# Patient Record
Sex: Female | Born: 1950 | Race: Black or African American | Hispanic: No | Marital: Married | Smoking: Never smoker
Health system: Southern US, Community
[De-identification: ages and names within clinical notes are randomized; demographics above are authoritative.]

## PROBLEM LIST (undated history)

## (undated) DIAGNOSIS — M549 Dorsalgia, unspecified: Secondary | ICD-10-CM

## (undated) DIAGNOSIS — Z973 Presence of spectacles and contact lenses: Secondary | ICD-10-CM

## (undated) DIAGNOSIS — Z972 Presence of dental prosthetic device (complete) (partial): Secondary | ICD-10-CM

## (undated) DIAGNOSIS — C801 Malignant (primary) neoplasm, unspecified: Secondary | ICD-10-CM

## (undated) DIAGNOSIS — R918 Other nonspecific abnormal finding of lung field: Secondary | ICD-10-CM

## (undated) DIAGNOSIS — I10 Essential (primary) hypertension: Secondary | ICD-10-CM

## (undated) DIAGNOSIS — T7840XA Allergy, unspecified, initial encounter: Secondary | ICD-10-CM

## (undated) HISTORY — DX: Other nonspecific abnormal finding of lung field: R91.8

## (undated) HISTORY — DX: Allergy, unspecified, initial encounter: T78.40XA

## (undated) HISTORY — DX: Dorsalgia, unspecified: M54.9

## (undated) HISTORY — PX: COLONOSCOPY W/ BIOPSIES AND POLYPECTOMY: SHX1376

## (undated) HISTORY — PX: MULTIPLE TOOTH EXTRACTIONS: SHX2053

---

## 1998-01-29 ENCOUNTER — Other Ambulatory Visit: Admission: RE | Admit: 1998-01-29 | Discharge: 1998-01-29 | Payer: Self-pay | Admitting: Family Medicine

## 2001-08-28 ENCOUNTER — Encounter: Admission: RE | Admit: 2001-08-28 | Discharge: 2001-08-28 | Payer: Self-pay | Admitting: Family Medicine

## 2001-08-28 ENCOUNTER — Encounter: Payer: Self-pay | Admitting: Family Medicine

## 2003-07-24 ENCOUNTER — Encounter: Admission: RE | Admit: 2003-07-24 | Discharge: 2003-07-24 | Payer: Self-pay | Admitting: Family Medicine

## 2003-09-22 ENCOUNTER — Ambulatory Visit (HOSPITAL_COMMUNITY): Admission: RE | Admit: 2003-09-22 | Discharge: 2003-09-22 | Payer: Self-pay | Admitting: Gastroenterology

## 2005-06-20 ENCOUNTER — Encounter: Admission: RE | Admit: 2005-06-20 | Discharge: 2005-06-20 | Payer: Self-pay | Admitting: Family Medicine

## 2006-07-24 ENCOUNTER — Encounter: Admission: RE | Admit: 2006-07-24 | Discharge: 2006-07-24 | Payer: Self-pay | Admitting: Family Medicine

## 2006-08-07 ENCOUNTER — Encounter: Admission: RE | Admit: 2006-08-07 | Discharge: 2006-08-07 | Payer: Self-pay | Admitting: Family Medicine

## 2007-08-29 ENCOUNTER — Encounter: Admission: RE | Admit: 2007-08-29 | Discharge: 2007-08-29 | Payer: Self-pay | Admitting: Family Medicine

## 2009-08-10 ENCOUNTER — Encounter: Admission: RE | Admit: 2009-08-10 | Discharge: 2009-08-10 | Payer: Self-pay | Admitting: Family Medicine

## 2010-09-06 ENCOUNTER — Other Ambulatory Visit: Payer: Self-pay | Admitting: Family Medicine

## 2010-09-06 DIAGNOSIS — Z1231 Encounter for screening mammogram for malignant neoplasm of breast: Secondary | ICD-10-CM

## 2010-10-04 ENCOUNTER — Ambulatory Visit: Payer: Self-pay

## 2010-10-08 NOTE — Op Note (Signed)
NAME:  Shelby Green, Shelby Green                           ACCOUNT NO.:  [id number]   MEDICAL RECORD NO.:  [id number]                   PATIENT TYPE:  AMB   LOCATION:  ENDO                                 FACILITY:  [facility]   PHYSICIAN:  Anselmo Rod, M.D.               DATE OF BIRTH:  14-Jan-1951   DATE OF PROCEDURE:  09/22/2003  DATE OF DISCHARGE:                                 OPERATIVE REPORT   PROCEDURE PERFORMED:  Screening colonoscopy.   ENDOSCOPIST:  Charna Elizabeth, M.D.   INSTRUMENT USED:  Olympus video colonoscope.   INDICATIONS FOR PROCEDURE:  The patient is a 70 African-American female  undergoing screening colonoscopy to rule out colonic polyps, masses, etc.   PREPROCEDURE PREPARATION:  Informed consent was procured from the patient.  The patient was fasted for eight hours prior to the procedure and prepped  with a bottle of magnesium citrate and a gallon of GoLYTELY the night prior  to the procedure.   PREPROCEDURE PHYSICAL:  The patient had stable vital signs.  Neck supple.  Chest clear to auscultation.  S1 and S2 regular.  Abdomen soft with normal  bowel sounds.   DESCRIPTION OF PROCEDURE:  The patient was placed in left lateral decubitus  position and sedated with 50 mg of Demerol and 5 mg of Versed intravenously.  Once the patient was adequately sedated and maintained on low flow oxygen  and continuous cardiac monitoring, the Olympus video colonoscope was  advanced from the rectum to the cecum.  The appendicular orifice and  ileocecal valve were clearly visualized and photographed.  Except for small  internal hemorrhoids seen on retroflexion, no other abnormalities were  noted.  No masses, polyps, or diverticula were seen.  The patient tolerated  the procedure well without immediate complications.   IMPRESSION:  Normal colonoscopy up to the cecum except for small internal  hemorrhoids, no masses or polyps seen, no evidence of diverticulosis.   RECOMMENDATIONS:  1.  Continue high fiber diet with liberal fluid intake.  2. Repeat colorectal cancer screening is recommended in the next 10 years     unless the patient develops any abnormal symptoms in the interim.  3. Outpatient followup as need arises in the future.                                               Anselmo Rod, M.D.    JNM/MEDQ  D:  09/22/2003  T:  09/22/2003  Job:  [id number]   cc:   Marjory Lies, M.D.  [address]  [place]  [place] [address]  Fax: [phone]

## 2010-10-11 ENCOUNTER — Ambulatory Visit
Admission: RE | Admit: 2010-10-11 | Discharge: 2010-10-11 | Disposition: A | Discharge status: Home / Self Care | Payer: BC Managed Care – PPO | Source: Ambulatory Visit | Attending: Family Medicine | Admitting: Family Medicine

## 2010-10-11 DIAGNOSIS — Z1231 Encounter for screening mammogram for malignant neoplasm of breast: Secondary | ICD-10-CM

## 2012-01-18 ENCOUNTER — Other Ambulatory Visit: Payer: Self-pay | Admitting: Family Medicine

## 2012-01-18 DIAGNOSIS — Z1231 Encounter for screening mammogram for malignant neoplasm of breast: Secondary | ICD-10-CM

## 2012-01-30 ENCOUNTER — Ambulatory Visit: Payer: BC Managed Care – PPO

## 2012-02-06 ENCOUNTER — Ambulatory Visit
Admission: RE | Admit: 2012-02-06 | Discharge: 2012-02-06 | Disposition: A | Discharge status: Home / Self Care | Payer: BC Managed Care – PPO | Source: Ambulatory Visit | Attending: Family Medicine | Admitting: Family Medicine

## 2012-02-06 DIAGNOSIS — Z1231 Encounter for screening mammogram for malignant neoplasm of breast: Secondary | ICD-10-CM

## 2012-08-01 ENCOUNTER — Ambulatory Visit (INDEPENDENT_AMBULATORY_CARE_PROVIDER_SITE_OTHER): Payer: BC Managed Care – PPO | Admitting: Family Medicine

## 2012-08-01 VITALS — BP 160/81 | HR 89 | Temp 98.3°F | Resp 16 | Ht 64.0 in | Wt 162.0 lb

## 2012-08-01 DIAGNOSIS — R42 Dizziness and giddiness: Secondary | ICD-10-CM

## 2012-08-01 DIAGNOSIS — M545 Low back pain, unspecified: Secondary | ICD-10-CM

## 2012-08-01 DIAGNOSIS — H8110 Benign paroxysmal vertigo, unspecified ear: Secondary | ICD-10-CM

## 2012-08-01 DIAGNOSIS — S139XXA Sprain of joints and ligaments of unspecified parts of neck, initial encounter: Secondary | ICD-10-CM

## 2012-08-01 DIAGNOSIS — S161XXA Strain of muscle, fascia and tendon at neck level, initial encounter: Secondary | ICD-10-CM

## 2012-08-01 LAB — POCT CBC
Granulocyte percent: 44.9 %G (ref 37–80)
HCT, POC: 43.5 % (ref 37.7–47.9)
Hemoglobin: 13.5 g/dL (ref 12.2–16.2)
Lymph, poc: 2.5 (ref 0.6–3.4)
MCH, POC: 29.2 pg (ref 27–31.2)
MCHC: 31 g/dL — AB (ref 31.8–35.4)
MCV: 94 fL (ref 80–97)
MID (cbc): 0.4 (ref 0–0.9)
MPV: 8 fL (ref 0–99.8)
POC Granulocyte: 2.4 (ref 2–6.9)
POC LYMPH PERCENT: 46.8 %L (ref 10–50)
POC MID %: 8.3 %M (ref 0–12)
Platelet Count, POC: 294 10*3/uL (ref 142–424)
RBC: 4.63 M/uL (ref 4.04–5.48)
RDW, POC: 14.3 %
WBC: 5.3 10*3/uL (ref 4.6–10.2)

## 2012-08-01 LAB — BASIC METABOLIC PANEL
BUN: 12 mg/dL (ref 6–23)
CO2: 30 mEq/L (ref 19–32)
Calcium: 9.6 mg/dL (ref 8.4–10.5)
Chloride: 105 mEq/L (ref 96–112)
Creat: 0.77 mg/dL (ref 0.50–1.10)
Glucose, Bld: 85 mg/dL (ref 70–99)
Potassium: 4.3 mEq/L (ref 3.5–5.3)
Sodium: 142 mEq/L (ref 135–145)

## 2012-08-01 LAB — GLUCOSE, POCT (MANUAL RESULT ENTRY): POC Glucose: 73 mg/dl (ref 70–99)

## 2012-08-01 MED ORDER — CYCLOBENZAPRINE HCL 10 MG PO TABS: 10.0000 mg | ORAL_TABLET | Freq: Two times a day (BID) | ORAL | Status: DC | PRN

## 2012-08-01 MED ORDER — MECLIZINE HCL 25 MG PO TABS: 25.0000 mg | ORAL_TABLET | Freq: Three times a day (TID) | ORAL | Status: DC | PRN

## 2012-08-01 NOTE — Patient Instructions (Addendum)
Use the muscle relaxer if needed for your neck pain/ strain.   If your back is bothering you use ibuprofen as needed If the vertigo (spinning feeling- not lightheaded feeling) comes back you may try the meclinzine- however do not combine this with the muscle relaxer (flexeril) as it could make you feel too sleepy  If any of these issues are not better in the next few days please let me know- Sooner if worse.

## 2012-08-01 NOTE — Progress Notes (Signed)
[facility] [address] [place] [address] [phone]  Date:  08/01/2012   Name:  Shelby Green   DOB:  1950-11-19   MRN:  [id number]  PCP:  No primary provider on file.    Chief Complaint: Neck Pain, Back Pain and Dizziness   History of Present Illness:  Shelby Green is a 62 y.o. very pleasant female patient who presents with the following:  She notes dizziness for the last 5 days or so.  She had noted a sensation of vertigo for the past few days, but today feels lightheaded. She has never had this problem for such a long time in the past, but has noted vertigo on occasion.  The vertigo occurs with motion, such as rolling over in bed or changing position.  When she holds still again she may have vertigo for about 2 minutes.  Today she does not have the vertigo but does have a lightheaded feeling.   No headache  She got new glasses about one month ago- otherwise no change in her vision.  She will sometimes have some tinnitus but this is longstanding.  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    Her back has hurt her for several months.  It is usually not severe, and hurts in her lower back.  Usually she is sore in her right side and not her left.  Her back hurts more when it is cold.  No radiation of her pain, no numbness or weakness   She is not aware of any injury to her neck or back.   Menopausal.    Her PCP is Dr. Rosezetta Schlatter.  She was told last year that her pulse was slow.   No CP or SOB.   She goes every year for her PE.    She has no significant medical history that she is aware of.    There is no problem list on file for this patient.   History reviewed. No pertinent past medical history.  History reviewed. No pertinent past surgical history.  History  Substance Use Topics  . Smoking status: Never Smoker   . Smokeless tobacco: Not on file  .  Alcohol Use: No    Family History  Problem Relation Age of Onset  . Arthritis Mother   . Hypertension Mother     No Known Allergies  Medication list has been reviewed and updated.  No current outpatient prescriptions on file prior to visit.   No current facility-administered medications on file prior to visit.    Review of Systems:  As per HPI- otherwise negative.   Physical Examination: Filed Vitals:   08/01/12 1141  BP: 138/85  Pulse: 57  Temp: 98.3 F (36.8 C)  Resp: 16   Filed Vitals:   08/01/12 1141  Height: 5\' 4"  (1.626 m)  Weight: 162 lb (73.483 kg)   Body mass index is 27.79 kg/(m^2). Ideal Body Weight: Weight in (lb) to have BMI = 25: 145.3  GEN: WDWN, NAD, Non-toxic, A & O x 3 HEENT: Atraumatic, Normocephalic. Neck supple. No masses, No LAD.  Bilateral TM wnl, oropharynx normal.  PEERL,EOMI.   Ears and Nose: No external deformity. CV: RRR, No M/G/R. No JVD. No thrill. No extra heart sounds. PULM: CTA B, no wheezes, crackles, rhonchi. No retractions. No resp. distress. No accessory muscle use. ABD: S,  NT, ND, +BS. No rebound. No HSM. EXTR: No c/c/e NEURO Normal gait. Normal strength, sensation and DTR all extremities, negative romberg PSYCH: Normally interactive. Conversant. Not depressed or anxious appearing.  Calm demeanor.  She is slightly tender in the left trapezius and neck muscles.   Back: normal flexion and extension.  minimal tenderness over right sciatic notch.  Negative SLR bilaterally, normal strength and sensation of legs Negative dix- halpike  EKG: sinus bradycardia to about 51 BPM, no ST elevation or depression  Results for orders placed in visit on 08/01/12  POCT CBC      Result Value Range   WBC 5.3  4.6 - 10.2 K/uL   Lymph, poc 2.5  0.6 - 3.4   POC LYMPH PERCENT 46.8  10 - 50 %L   MID (cbc) 0.4  0 - 0.9   POC MID % 8.3  0 - 12 %M   POC Granulocyte 2.4  2 - 6.9   Granulocyte percent 44.9  37 - 80 %G   RBC 4.63  4.04 - 5.48  M/uL   Hemoglobin 13.5  12.2 - 16.2 g/dL   HCT, POC 40.9  81.1 - 47.9 %   MCV 94.0  80 - 97 fL   MCH, POC 29.2  27 - 31.2 pg   MCHC 31.0 (*) 31.8 - 35.4 g/dL   RDW, POC 91.4     Platelet Count, POC 294  142 - 424 K/uL   MPV 8.0  0 - 99.8 fL  GLUCOSE, POCT (MANUAL RESULT ENTRY)      Result Value Range   POC Glucose 73  70 - 99 mg/dl   Pulse to around 90 with walking  See orthostatic BP and pulse- no orthostasis Assessment and Plan:.\ Lightheaded - Plan: POCT CBC, POCT glucose (manual entry), Basic metabolic panel, TSH, EKG 12-Lead  Neck strain, initial encounter - Plan: cyclobenzaprine (FLEXERIL) 10 MG tablet  Lumbar back pain  Benign paroxysmal positional vertigo - Plan: meclizine (ANTIVERT) 25 MG tablet  Leverne is here with a few concerns today.  It sounds like she has been having vertigo due to BPPV or a viral infection. Her vertigo is now resolved, but she has felt lightheaded.  Encouraged her to increase her fluid intake.  She may use meclizine if her vertigo returns.  Discussed her mild bradycardia. At this time she does not desire cardiology evaluation, but will keep an eye on her symptoms. Await TSH as well.    Flexeril to use as needed for neck and back pain.  She also has used ibuprofen with good results    COPLAND,JESSICA, MD

## 2012-08-02 ENCOUNTER — Telehealth: Payer: Self-pay | Admitting: Family Medicine

## 2012-08-02 ENCOUNTER — Encounter: Payer: Self-pay | Admitting: Family Medicine

## 2012-08-02 LAB — TSH: TSH: 1.532 u[IU]/mL (ref 0.350–4.500)

## 2012-08-02 NOTE — Telephone Encounter (Signed)
Called to go over her labs.  No answer so left detailed message on her home phone.  Labs are normal, will send her a copy of her labs and EKG.  If symptoms persist please let us or her PCP know. Have her PCP compare her EKG with any old EKGs at their next visit.

## 2014-05-20 ENCOUNTER — Other Ambulatory Visit: Payer: Self-pay | Admitting: Family Medicine

## 2014-05-20 DIAGNOSIS — Z1231 Encounter for screening mammogram for malignant neoplasm of breast: Secondary | ICD-10-CM

## 2014-05-29 ENCOUNTER — Ambulatory Visit: Payer: BC Managed Care – PPO

## 2014-06-03 ENCOUNTER — Ambulatory Visit
Admission: RE | Admit: 2014-06-03 | Discharge: 2014-06-03 | Disposition: A | Discharge status: Home / Self Care | Payer: BLUE CROSS/BLUE SHIELD | Source: Ambulatory Visit | Attending: Family Medicine | Admitting: Family Medicine

## 2014-06-03 ENCOUNTER — Other Ambulatory Visit: Payer: Self-pay | Admitting: Family Medicine

## 2014-06-03 DIAGNOSIS — Z1231 Encounter for screening mammogram for malignant neoplasm of breast: Secondary | ICD-10-CM

## 2014-08-28 ENCOUNTER — Encounter (HOSPITAL_COMMUNITY): Payer: Self-pay | Admitting: Emergency Medicine

## 2014-08-28 ENCOUNTER — Emergency Department (HOSPITAL_COMMUNITY)
Admission: EM | Admit: 2014-08-28 | Discharge: 2014-08-28 | Disposition: A | Discharge status: Home / Self Care | Payer: BLUE CROSS/BLUE SHIELD | Attending: Emergency Medicine | Admitting: Emergency Medicine

## 2014-08-28 DIAGNOSIS — Y9389 Activity, other specified: Secondary | ICD-10-CM | POA: Diagnosis not present

## 2014-08-28 DIAGNOSIS — Z79899 Other long term (current) drug therapy: Secondary | ICD-10-CM | POA: Diagnosis not present

## 2014-08-28 DIAGNOSIS — Y92410 Unspecified street and highway as the place of occurrence of the external cause: Secondary | ICD-10-CM | POA: Diagnosis not present

## 2014-08-28 DIAGNOSIS — Y998 Other external cause status: Secondary | ICD-10-CM | POA: Diagnosis not present

## 2014-08-28 DIAGNOSIS — M25512 Pain in left shoulder: Secondary | ICD-10-CM

## 2014-08-28 DIAGNOSIS — I10 Essential (primary) hypertension: Secondary | ICD-10-CM | POA: Diagnosis not present

## 2014-08-28 DIAGNOSIS — S4992XA Unspecified injury of left shoulder and upper arm, initial encounter: Secondary | ICD-10-CM | POA: Insufficient documentation

## 2014-08-28 DIAGNOSIS — S161XXA Strain of muscle, fascia and tendon at neck level, initial encounter: Secondary | ICD-10-CM | POA: Diagnosis not present

## 2014-08-28 HISTORY — DX: Essential (primary) hypertension: I10

## 2014-08-28 MED ORDER — CYCLOBENZAPRINE HCL 10 MG PO TABS: 10.0000 mg | ORAL_TABLET | Freq: Two times a day (BID) | ORAL | Status: DC | PRN

## 2014-08-28 MED ORDER — ACETAMINOPHEN 325 MG PO TABS
650.0000 mg | ORAL_TABLET | Freq: Once | ORAL | Status: AC
  Administered 2014-08-28: 650 mg via ORAL
  Filled 2014-08-28: qty 2
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
## 2015-06-03 ENCOUNTER — Other Ambulatory Visit: Payer: Self-pay

## 2015-06-03 DIAGNOSIS — Z1231 Encounter for screening mammogram for malignant neoplasm of breast: Secondary | ICD-10-CM

## 2015-06-12 ENCOUNTER — Ambulatory Visit: Payer: BLUE CROSS/BLUE SHIELD

## 2015-06-19 ENCOUNTER — Ambulatory Visit
Admission: RE | Admit: 2015-06-19 | Discharge: 2015-06-19 | Disposition: A | Discharge status: Home / Self Care | Payer: BLUE CROSS/BLUE SHIELD | Source: Ambulatory Visit

## 2015-06-19 DIAGNOSIS — Z1231 Encounter for screening mammogram for malignant neoplasm of breast: Secondary | ICD-10-CM

## 2015-06-23 ENCOUNTER — Ambulatory Visit: Payer: BLUE CROSS/BLUE SHIELD

## 2015-08-06 ENCOUNTER — Ambulatory Visit (INDEPENDENT_AMBULATORY_CARE_PROVIDER_SITE_OTHER): Payer: 59

## 2015-08-06 ENCOUNTER — Ambulatory Visit (INDEPENDENT_AMBULATORY_CARE_PROVIDER_SITE_OTHER): Payer: 59 | Admitting: Urgent Care

## 2015-08-06 VITALS — BP 144/80 | HR 60 | Temp 98.6°F | Resp 16 | Ht 64.0 in | Wt 164.6 lb

## 2015-08-06 DIAGNOSIS — M25512 Pain in left shoulder: Secondary | ICD-10-CM

## 2015-08-06 DIAGNOSIS — R252 Cramp and spasm: Secondary | ICD-10-CM

## 2015-08-06 DIAGNOSIS — I10 Essential (primary) hypertension: Secondary | ICD-10-CM

## 2015-08-06 DIAGNOSIS — Z23 Encounter for immunization: Secondary | ICD-10-CM | POA: Diagnosis not present

## 2015-08-06 LAB — COMPLETE METABOLIC PANEL WITH GFR
ALT: 14 U/L (ref 6–29)
AST: 16 U/L (ref 10–35)
Albumin: 4.1 g/dL (ref 3.6–5.1)
Alkaline Phosphatase: 65 U/L (ref 33–130)
BUN: 12 mg/dL (ref 7–25)
CO2: 27 mmol/L (ref 20–31)
Calcium: 9.5 mg/dL (ref 8.6–10.4)
Chloride: 102 mmol/L (ref 98–110)
Creat: 0.83 mg/dL (ref 0.50–0.99)
GFR, Est African American: 87 mL/min (ref >=60)
GFR, Est Non African American: 75 mL/min (ref >=60)
Glucose, Bld: 82 mg/dL (ref 65–99)
Potassium: 4 mmol/L (ref 3.5–5.3)
Sodium: 143 mmol/L (ref 135–146)
Total Bilirubin: 0.8 mg/dL (ref 0.2–1.2)
Total Protein: 6.5 g/dL (ref 6.1–8.1)

## 2015-08-06 MED ORDER — AMLODIPINE BESYLATE 5 MG PO TABS
5.0000 mg | ORAL_TABLET | Freq: Every day | ORAL | Status: DC
Start: 2015-08-06 — End: 2016-08-11

## 2015-08-06 MED ORDER — CYCLOBENZAPRINE HCL 5 MG PO TABS: 5.0000 mg | ORAL_TABLET | Freq: Every day | ORAL | Status: DC

## 2015-08-06 MED ORDER — NAPROXEN SODIUM 550 MG PO TABS
550.0000 mg | ORAL_TABLET | Freq: Two times a day (BID) | ORAL | Status: DC
Start: 2015-08-06 — End: 2018-02-05

## 2015-08-06 NOTE — Progress Notes (Signed)
MRN: [id number] DOB: 1950-08-29  Subjective:   Shelby Green is a 65 y.o. female presenting for chief complaint of Shoulder Pain; tingling sensation in both legs; and Flu Vaccine  Flu shot - Patient would like to have a flu shot today. Denies previous reaction to it. The last flu shot she received was ~2 years ago.  Shoulder - Reports 1 day history of left shoulder tightness, soreness. Has not tried any medications for this. Patient works at a [employer], uses her arms a lot. Denies trauma, redness, swelling, bony deformity. Patient wants to make sure she doesn't have arthritis given her family history of difficulty with this diagnosis.  Legs - Reports history of intermittent leg cramps, most notably while taking hydrochlorothiazide. She has since stopped taking this medication. Tries to drink ~32 ounces of water daily but is not always consistent with this. Patient stands on her feet most of the day while at work, sometimes shoes have an impact on her leg cramps. Denies smoking cigarettes or drinking alcohol.   Shelby Green currently has no medications in their medication list. Also has No Known Allergies.  Shelby Green  has a past medical history of Hypertension and Allergy. Also  has no past surgical history on file.   Reports family history includes Arthritis in her mother; Hypertension in her mother and sister; Stroke in her mother.   Objective:   Vitals: BP 144/80 mmHg  Pulse 60  Temp(Src) 98.6 F (37 C) (Oral)  Resp 16  Ht '5\' 4"'$  (1.626 m)  Wt 164 lb 9.6 oz (74.662 kg)  BMI 28.24 kg/m2  SpO2 98%  Physical Exam  Constitutional: She is oriented to person, place, and time. She appears well-developed and well-nourished.  HENT:  Mouth/Throat: Oropharynx is clear and moist.  Eyes: No scleral icterus.  Neck: Normal range of motion. Neck supple.  Cardiovascular: Normal rate, regular rhythm and intact distal pulses.  Exam reveals no gallop and no friction rub.   No murmur  heard. Pulmonary/Chest: No respiratory distress. She has no wheezes. She has no rales.  Musculoskeletal:       Left shoulder: She exhibits spasm (left trapezius). She exhibits normal range of motion, no tenderness (Negative Hawkin, Neer, Empty Can tests), no bony tenderness, no swelling, no effusion, no crepitus, no deformity, no laceration, no pain and normal strength.       Right lower leg: She exhibits no tenderness, no bony tenderness, no swelling, no edema, no deformity and no laceration.       Left lower leg: She exhibits no tenderness, no bony tenderness, no swelling, no edema, no deformity and no laceration.  Neurological: She is alert and oriented to person, place, and time. She has normal reflexes. Coordination normal.  Skin: Skin is warm and dry.  Psychiatric: She has a normal mood and affect.   No results found.  Assessment and Plan :   1. Essential hypertension - Patient agreed to restart BP management. Start amlodipine '5mg'$ . Check BP at home weekly. Recommended dietary modifications. RTC in 3 months or sooner if BP remains uncontrolled. F/u with labs by phone.  2. Left shoulder pain - Likely due to overuse, secondary to her work, radiology reading pending. Will start conservative management. RTC if no improvement or worsening symptoms despite Flexeril and Anaprox.  3. Cramp of both lower extremities - Like due to lack of hydration and standing for long periods of time. Counseled on increasing water intake and using compression stockings. Patient will recheck with  me if her leg cramps persist. Consider U/S.  4. Need for prophylactic vaccination and inoculation against influenza - Flu Vaccine QUAD 36+ mos IM   Jaynee Eagles, PA-C [facility] [phone] 08/06/2015 3:41 PM

## 2015-08-06 NOTE — Patient Instructions (Addendum)
Hypertension Hypertension, commonly called high blood pressure, is when the force of blood pumping through your arteries is too strong. Your arteries are the blood vessels that carry blood from your heart throughout your body. A blood pressure reading consists of a higher number over a lower number, such as 110/72. The higher number (systolic) is the pressure inside your arteries when your heart pumps. The lower number (diastolic) is the pressure inside your arteries when your heart relaxes. Ideally you want your blood pressure below 120/80. Hypertension forces your heart to work harder to pump blood. Your arteries may become narrow or stiff. Having untreated or uncontrolled hypertension can cause heart attack, stroke, kidney disease, and other problems. RISK FACTORS Some risk factors for high blood pressure are controllable. Others are not.  Risk factors you cannot control include:   Race. You may be at higher risk if you are African American.  Age. Risk increases with age.  Gender. Men are at higher risk than women before age 45 years. After age 65, women are at higher risk than men. Risk factors you can control include:  Not getting enough exercise or physical activity.  Being overweight.  Getting too much fat, sugar, calories, or salt in your diet.  Drinking too much alcohol. SIGNS AND SYMPTOMS Hypertension does not usually cause signs or symptoms. Extremely high blood pressure (hypertensive crisis) may cause headache, anxiety, shortness of breath, and nosebleed. DIAGNOSIS To check if you have hypertension, your health care provider will measure your blood pressure while you are seated, with your arm held at the level of your heart. It should be measured at least twice using the same arm. Certain conditions can cause a difference in blood pressure between your right and left arms. A blood pressure reading that is higher than normal on one occasion does not mean that you need treatment. If  it is not clear whether you have high blood pressure, you may be asked to return on a different day to have your blood pressure checked again. Or, you may be asked to monitor your blood pressure at home for 1 or more weeks. TREATMENT Treating high blood pressure includes making lifestyle changes and possibly taking medicine. Living a healthy lifestyle can help lower high blood pressure. You may need to change some of your habits. Lifestyle changes may include:  Following the DASH diet. This diet is high in fruits, vegetables, and whole grains. It is low in salt, red meat, and added sugars.  Keep your sodium intake below 2,300 mg per day.  Getting at least 30-45 minutes of aerobic exercise at least 4 times per week.  Losing weight if necessary.  Not smoking.  Limiting alcoholic beverages.  Learning ways to reduce stress. Your health care provider may prescribe medicine if lifestyle changes are not enough to get your blood pressure under control, and if one of the following is true:  You are 18-59 years of age and your systolic blood pressure is above 140.  You are 60 years of age or older, and your systolic blood pressure is above 150.  Your diastolic blood pressure is above 90.  You have diabetes, and your systolic blood pressure is over 140 or your diastolic blood pressure is over 90.  You have kidney disease and your blood pressure is above 140/90.  You have heart disease and your blood pressure is above 140/90. Your personal target blood pressure may vary depending on your medical conditions, your age, and other factors. HOME CARE INSTRUCTIONS    Have your blood pressure rechecked as directed by your health care provider.   Take medicines only as directed by your health care provider. Follow the directions carefully. Blood pressure medicines must be taken as prescribed. The medicine does not work as well when you skip doses. Skipping doses also puts you at risk for  problems.  Do not smoke.   Monitor your blood pressure at home as directed by your health care provider. SEEK MEDICAL CARE IF:   You think you are having a reaction to medicines taken.  You have recurrent headaches or feel dizzy.  You have swelling in your ankles.  You have trouble with your vision. SEEK IMMEDIATE MEDICAL CARE IF:  You develop a severe headache or confusion.  You have unusual weakness, numbness, or feel faint.  You have severe chest or abdominal pain.  You vomit repeatedly.  You have trouble breathing. MAKE SURE YOU:   Understand these instructions.  Will watch your condition.  Will get help right away if you are not doing well or get worse.   This information is not intended to replace advice given to you by your health care provider. Make sure you discuss any questions you have with your health care provider.   Document Released: 05/09/2005 Document Revised: 09/23/2014 Document Reviewed: 03/01/2013 Elsevier Interactive Patient Education 2016 Elsevier Inc.   Amlodipine tablets What is this medicine? AMLODIPINE (am LOE di peen) is a calcium-channel blocker. It affects the amount of calcium found in your heart and muscle cells. This relaxes your blood vessels, which can reduce the amount of work the heart has to do. This medicine is used to lower high blood pressure. It is also used to prevent chest pain. This medicine may be used for other purposes; ask your health care provider or pharmacist if you have questions. What should I tell my health care provider before I take this medicine? They need to know if you have any of these conditions: -heart problems like heart failure or aortic stenosis -liver disease -an unusual or allergic reaction to amlodipine, other medicines, foods, dyes, or preservatives -pregnant or trying to get pregnant -breast-feeding How should I use this medicine? Take this medicine by mouth with a glass of water. Follow the  directions on the prescription label. Take your medicine at regular intervals. Do not take more medicine than directed. Talk to your pediatrician regarding the use of this medicine in children. Special care may be needed. This medicine has been used in children as young as 6. Persons over 7 years old may have a stronger reaction to this medicine and need smaller doses. Overdosage: If you think you have taken too much of this medicine contact a poison control center or emergency room at once. NOTE: This medicine is only for you. Do not share this medicine with others. What if I miss a dose? If you miss a dose, take it as soon as you can. If it is almost time for your next dose, take only that dose. Do not take double or extra doses. What may interact with this medicine? -herbal or dietary supplements -local or general anesthetics -medicines for high blood pressure -medicines for prostate problems -rifampin This list may not describe all possible interactions. Give your health care provider a list of all the medicines, herbs, non-prescription drugs, or dietary supplements you use. Also tell them if you smoke, drink alcohol, or use illegal drugs. Some items may interact with your medicine. What should I watch for while using  this medicine? Visit your doctor or health care professional for regular check ups. Check your blood pressure and pulse rate regularly. Ask your health care professional what your blood pressure and pulse rate should be, and when you should contact him or her. This medicine may make you feel confused, dizzy or lightheaded. Do not drive, use machinery, or do anything that needs mental alertness until you know how this medicine affects you. To reduce the risk of dizzy or fainting spells, do not sit or stand up quickly, especially if you are an older patient. Avoid alcoholic drinks; they can make you more dizzy. Do not suddenly stop taking amlodipine. Ask your doctor or health care  professional how you can gradually reduce the dose. What side effects may I notice from receiving this medicine? Side effects that you should report to your doctor or health care professional as soon as possible: -allergic reactions like skin rash, itching or hives, swelling of the face, lips, or tongue -breathing problems -changes in vision or hearing -chest pain -fast, irregular heartbeat -swelling of legs or ankles Side effects that usually do not require medical attention (report to your doctor or health care professional if they continue or are bothersome): -dry mouth -facial flushing -nausea, vomiting -stomach gas, pain -tired, weak -trouble sleeping This list may not describe all possible side effects. Call your doctor for medical advice about side effects. You may report side effects to FDA at 1-800-FDA-1088. Where should I keep my medicine? Keep out of the reach of children. Store at room temperature between 59 and 86 degrees F (15 and 30 degrees C). Protect from light. Keep container tightly closed. Throw away any unused medicine after the expiration date. NOTE: This sheet is a summary. It may not cover all possible information. If you have questions about this medicine, talk to your doctor, pharmacist, or health care provider.    2016, Elsevier/Gold Standard. (2012-04-06 11:40:58)    How to Use Compression Stockings Compression stockings are elastic socks that squeeze the legs. They help to increase blood flow to the legs, decrease swelling in the legs, and reduce the chance of developing blood clots in the lower legs. Compression stockings are often used by people who:  Are recovering from surgery.  Have poor circulation in their legs.  Are prone to getting blood clots in their legs.  Have varicose veins.  Sit or stay in bed for long periods of time. HOW TO USE COMPRESSION STOCKINGS Before you put on your compression stockings:  Make sure that they are the  correct size. If you do not know your size, ask your health care provider.  Make sure that they are clean, dry, and in good condition.  Check them for rips and tears. Do not put them on if they are ripped or torn. Put your stockings on first thing in the morning, before you get out of bed. Keep them on for as long as your health care provider advises. When you are wearing your stockings:  Keep them as smooth as possible. Do not allow them to bunch up. It is especially important to prevent the stockings from bunching up around your toes or behind your knees.  Do not roll the stockings downward and leave them rolled down. This can decrease blood flow to your leg.  Change them right away if they become wet or dirty. When you take off your stockings, inspect your legs and feet. Anything that does not seem normal may require medical attention. Look for:  Open sores.  Red spots.  Swelling. INFORMATION AND TIPS  Do not stop wearing your compression stockings without talking to your health care provider first.  Wash your stockings everyday with mild detergent in cold or warm water. Do not use bleach. Air-dry your stockings or dry them in a clothes dryer on low heat.  Replace your stockings every 3-6 months.  If skin moisturizing is part of your treatment plan, apply lotion or cream at night so that your skin will be dry when you put on the stockings in the morning. It is harder to put the stockings on when you have lotion on your legs or feet. SEEK MEDICAL CARE IF: Remove your stockings and seek medical care if:  You have a feeling of pins and needles in your feet or legs.  You have any new changes in your skin.  You have skin lesions that are getting worse.  You have swelling or pain that is getting worse. SEEK IMMEDIATE MEDICAL CARE IF:  You have numbness or tingling in your lower legs that does not get better immediately after you take the stockings off.  Your toes or feet become  cold and blue.  You develop open sores or red spots on your legs that do not go away.  You see or feel a warm spot on your leg.  You have new swelling or soreness in your leg.  You are short of breath or you have chest pain for no reason.  You have a rapid or irregular heartbeat.  You feel light-headed or dizzy.   This information is not intended to replace advice given to you by your health care provider. Make sure you discuss any questions you have with your health care provider.   Document Released: 03/06/2009 Document Revised: 09/23/2014 Document Reviewed: 04/16/2014 Elsevier Interactive Patient Education 2016 [employer].     IF you received an x-ray today, you will receive an invoice from [facility]. Please contact [facility] at [phone] with questions or concerns regarding your invoice.   IF you received labwork today, you will receive an invoice from [employer]. Please contact [facility] at [phone] with questions or concerns regarding your invoice.   Our billing staff will not be able to assist you with questions regarding bills from these companies.  You will be contacted with the lab results as soon as they are available. The fastest way to get your results is to activate your My Chart account. Instructions are located on the last page of this paperwork. If you have not heard from [place] regarding the results in 2 weeks, please contact this office.

## 2016-03-28 DIAGNOSIS — I10 Essential (primary) hypertension: Secondary | ICD-10-CM | POA: Insufficient documentation

## 2016-08-11 ENCOUNTER — Other Ambulatory Visit: Payer: Self-pay | Admitting: Urgent Care

## 2016-08-11 DIAGNOSIS — I10 Essential (primary) hypertension: Secondary | ICD-10-CM

## 2016-08-15 ENCOUNTER — Other Ambulatory Visit: Payer: Self-pay | Admitting: Physician Assistant

## 2016-08-15 DIAGNOSIS — Z1231 Encounter for screening mammogram for malignant neoplasm of breast: Secondary | ICD-10-CM

## 2016-09-05 ENCOUNTER — Ambulatory Visit
Admission: RE | Admit: 2016-09-05 | Discharge: 2016-09-05 | Disposition: A | Discharge status: Home / Self Care | Payer: 59 | Source: Ambulatory Visit | Attending: Physician Assistant | Admitting: Physician Assistant

## 2016-09-05 DIAGNOSIS — Z1231 Encounter for screening mammogram for malignant neoplasm of breast: Secondary | ICD-10-CM

## 2016-10-09 ENCOUNTER — Other Ambulatory Visit: Payer: Self-pay | Admitting: Urgent Care

## 2016-10-09 DIAGNOSIS — I10 Essential (primary) hypertension: Secondary | ICD-10-CM

## 2016-10-15 ENCOUNTER — Other Ambulatory Visit: Payer: Self-pay | Admitting: Urgent Care

## 2016-10-15 DIAGNOSIS — I10 Essential (primary) hypertension: Secondary | ICD-10-CM

## 2016-11-20 ENCOUNTER — Other Ambulatory Visit: Payer: Self-pay | Admitting: Urgent Care

## 2016-11-20 DIAGNOSIS — I10 Essential (primary) hypertension: Secondary | ICD-10-CM

## 2016-11-22 ENCOUNTER — Telehealth: Payer: Self-pay | Admitting: Family Medicine

## 2016-11-22 NOTE — Telephone Encounter (Signed)
lmom to call and set up an appointment with [facility] for an [place] med f/u

## 2016-11-22 NOTE — Telephone Encounter (Signed)
lmom to call and set up an appointment with [facility] for an [place] med f/u

## 2017-08-15 ENCOUNTER — Other Ambulatory Visit: Payer: Self-pay | Admitting: Physician Assistant

## 2017-08-15 DIAGNOSIS — Z1231 Encounter for screening mammogram for malignant neoplasm of breast: Secondary | ICD-10-CM

## 2017-09-07 ENCOUNTER — Ambulatory Visit: Payer: 59

## 2017-09-08 ENCOUNTER — Ambulatory Visit: Payer: 59

## 2017-09-11 ENCOUNTER — Ambulatory Visit
Admission: RE | Admit: 2017-09-11 | Discharge: 2017-09-11 | Disposition: A | Discharge status: Home / Self Care | Payer: Medicare PPO | Source: Ambulatory Visit | Attending: Physician Assistant | Admitting: Physician Assistant

## 2017-09-11 DIAGNOSIS — Z1231 Encounter for screening mammogram for malignant neoplasm of breast: Secondary | ICD-10-CM

## 2018-01-21 DIAGNOSIS — C3490 Malignant neoplasm of unspecified part of unspecified bronchus or lung: Secondary | ICD-10-CM | POA: Insufficient documentation

## 2018-01-21 HISTORY — DX: Malignant neoplasm of unspecified part of unspecified bronchus or lung: C34.90

## 2018-02-01 ENCOUNTER — Encounter: Payer: Medicare PPO | Admitting: Cardiothoracic Surgery

## 2018-02-05 ENCOUNTER — Institutional Professional Consult (permissible substitution): Payer: Medicare PPO | Admitting: Cardiothoracic Surgery

## 2018-02-05 VITALS — BP 145/78 | HR 76 | Resp 20 | Ht 64.0 in | Wt 155.0 lb

## 2018-02-05 DIAGNOSIS — R911 Solitary pulmonary nodule: Secondary | ICD-10-CM

## 2018-02-05 NOTE — Progress Notes (Signed)
[address]       [place] [address]             [phone]                    Aprile W Yellin Weir Medical Record #[id number] Date of Birth: [birth year]  Referring: Gwenevere Ghazi, MD Primary Care: Stephens Shire, MD Primary Cardiologist: No primary care provider on file.  Chief Complaint:    Chief Complaint  Patient presents with  . Lung Lesion    History of Present Illness:    Shelby Green 67 y.o. female is seen in the office  today for evaluation of a right lower lobe lung nodule.  The patient is states she has been a lifelong non-smoker.  She presented in June with upper respiratory complaints sinus drainage and cough.  Because of these complaints she was seen in urgent care and a chest x-ray was obtained, there was some opacity in the right lower lobe.  Subsequently the patient was referred to Dr. Glade Lloyd in [facility], CT scan of the chest and PET scan were performed.  Bronchoscopy was done that was unrevealing of any tissue diagnosis by Dr. Glade Lloyd.  This the plan was to obtain a follow-up CT scan in several months which was done last week.  The right lower lobe mass by report appears the same.  The patient will return and bring copies of the discs of her CT scans.  I have reviewed the PET scan images, and have requested pathology to amend the report as the lesion is in the right lower lobe not the right upper lobe.   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  Her husband has been a long-term smoker.  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  Currently she remains active walking up to 4 miles a day.      Current Activity/ Functional Status:  Patient is independent with mobility/ambulation, transfers, ADL's, IADL's.   Zubrod Score: At the time of surgery this patient's most appropriate activity status/level should be described as: [x]     0    Normal  activity, no symptoms []     1    Restricted in physical strenuous activity but ambulatory, able to do out light work []     2    Ambulatory and capable of self care, unable to do work activities, up and about               >50 % of waking hours                              []     3    Only limited self care, in bed greater than 50% of waking hours []     4    Completely disabled, no self care, confined to bed or chair []     5    Moribund   Past Medical History:  Diagnosis Date  . Allergy   . Back pain   . Hypertension   . Hypertension   . Lung mass     No past surgical history on file.  Family History  Problem Relation Age of Onset  . Arthritis Mother   . Hypertension Mother   . Stroke Mother   . Hypertension Sister  Patient's family history is for father who died in his 34s in a tractor accident.  Since somebody ran into the back of patient's mother died at age 68 with issues related to stroke she had known coronary occlusive disease, one brother died at age 21 of myocardial infarction she has 1 brother insists 5 sisters who are all lung.  One aunt, -sister of her mother died at age 65 of lung cancer she was a lifelong non-smoker   Social History   Tobacco Use  Smoking Status Never Smoker  Smokeless Tobacco Never Used    Social History   Substance and Sexual Activity  Alcohol Use No     Allergies  Allergen Reactions  . Penicillin G Itching    Current Outpatient Medications  Medication Sig Dispense Refill  . Travoprost, BAK Free, (TRAVATAN) 0.004 % SOLN ophthalmic solution Place 1 drop into both eyes at bedtime.     No current facility-administered medications for this visit.     Pertinent items are noted in HPI.   Review of Systems:     Cardiac Review of Systems: [Y] = yes  or   [ N ] = no   Chest Pain [  n  ]  Resting SOB [ n ] Exertional SOB  Blue.Reese  ]  Orthopnea Florencio.Farrier  ]   Pedal Edema [ n  ]    Palpitations [ n ] Syncope  [ n ]   Presyncope [ n  ]   General  Review of Systems: [Y] = yes [  ]=no Constitional: recent weight change [  ];  Wt loss over the last 3 months [   ] anorexia [  ]; fatigue [  ]; nausea [  ]; night sweats [  ]; fever [  ]; or chills [  ];           Eye : blurred vision [  ]; diplopia [   ]; vision changes [  ];  Amaurosis fugax[  ]; Resp: cough [  ];  wheezing[  ];  hemoptysis[  ]; shortness of breath[  ]; paroxysmal nocturnal dyspnea[  ]; dyspnea on exertion[  ]; or orthopnea[  ];  GI:  gallstones[  ], vomiting[  ];  dysphagia[  ]; melena[  ];  hematochezia [  ]; heartburn[  ];   Hx of  Colonoscopy[ y ]; GU: kidney stones [  ]; hematuria[  ];   dysuria [  ];  nocturia[  ];  history of     obstruction [  ]; urinary frequency [  ]             Skin: rash, swelling[  ];, hair loss[  ];  peripheral edema[  ];  or itching[  ]; Musculosketetal: myalgias[  ];  joint swelling[  ];  joint erythema[  ];  joint pain[  ];  back pain[  ];  Heme/Lymph: bruising[  ];  bleeding[  ];  anemia[  ];  Neuro: TIA[  ];  headaches[  ];  stroke[  ];  vertigo[  ];  seizures[  ];   paresthesias[  ];  difficulty walking[  ];  Psych:depression[  ]; anxiety[  ];  Endocrine: diabetes[  ];  thyroid dysfunction[  ];  Immunizations: Flu up to date [  ]; Pneumococcal up to date [  ];  Other:    PHYSICAL EXAMINATION: BP (!) 145/78   Pulse 76   Resp 20   Ht 5\' 4"  (1.626 m)  Wt 155 lb (70.3 kg)   SpO2 96% Comment: RA  BMI 26.61 kg/m  General appearance: alert, cooperative and no distress Head: Normocephalic, without obvious abnormality, atraumatic Neck: no adenopathy, no carotid bruit, no JVD, supple, symmetrical, trachea midline and thyroid not enlarged, symmetric, no tenderness/mass/nodules Lymph nodes: Cervical, supraclavicular, and axillary nodes normal. Resp: clear to auscultation bilaterally Back: symmetric, no curvature. ROM normal. No CVA tenderness. Cardio: regular rate and rhythm, S1, S2 normal, no murmur, click, rub or gallop GI: soft,  non-tender; bowel sounds normal; no masses,  no organomegaly Extremities: extremities normal, atraumatic, no cyanosis or edema Neurologic: Grossly normal  Diagnostic Studies & Laboratory data:     Recent Radiology Findings:   CLINICAL DATA: Initial treatment strategy for pulmonary nodule.  Final Report  ADDENDUM REPORT: 02/05/2018 15:37  ADDENDUM: Correction :  IMPRESSION:  Hypermetabolic RIGHT LOWER lobe nodule.  EXAM: NUCLEAR MEDICINE PET SKULL BASE TO THIGH  TECHNIQUE: Fifteen mCi F-18 FDG was injected intravenously. Full-ring PET imaging was performed from the skull base to thigh after the radiotracer. CT data was obtained and used for attenuation correction and anatomic localization.  Fasting blood glucose: 94 mg/dl  COMPARISON: None available  FINDINGS: Mediastinal blood pool activity: SUV max 1. Is 48  NECK: No hypermetabolic lymph nodes in the neck.  Incidental CT findings: none  CHEST: Within the RIGHT lower lobe 18 mm nodule above the RIGHT hemidiaphragm has associated metabolic activity with SUV max equal 4.8 no additional pulmonary nodules. No hypermetabolic mediastinal lymph nodes.  Incidental CT findings: None  ABDOMEN/PELVIS: No abnormal hypermetabolic activity within the liver, pancreas, adrenal glands, or spleen. No hypermetabolic lymph nodes in the abdomen or pelvis. No adnexal abnormality.  Incidental CT findings: none  SKELETON: No focal hypermetabolic activity to suggest skeletal metastasis.  Incidental CT findings: none  IMPRESSION: 1. Hypermetabolic RIGHT upper lobe pulmonary nodule is concerning for bronchogenic carcinoma. If no smoking history, consider a focus of pulmonary infection. As lesion would be difficult for percutaneous biopsy, consider short-term CT follow-up (6 weeks). If more immediate tissue sampling is warranted, consider bronchoscopy. 2. No hypermetabolic mediastinal nodes.   Electronically Signed By: Suzy Bouchard M.D. On: 11/17/2017 15:56            I have independently reviewed the above radiology studies  and reviewed the findings with the patient.   Recent Lab Findings: Lab Results  Component Value Date   WBC 5.3 08/01/2012   HGB 13.5 08/01/2012   HCT 43.5 08/01/2012   GLUCOSE 82 08/06/2015   ALT 14 08/06/2015   AST 16 08/06/2015   NA 143 08/06/2015   K 4.0 08/06/2015   CL 102 08/06/2015   CREATININE 0.83 08/06/2015   BUN 12 08/06/2015   CO2 27 08/06/2015   TSH 1.532 08/01/2012      Assessment / Plan:   1.8 cm mildly hypermetabolic right lower lobe lung lesion very close to the diaphragm is-the area has been persistent compared to CT scans from June 2019 and August 2019. -She is been referred for consideration of surgical resection.  I discussed with her the pros and cons of attempting a biopsy versus a surgical resection and biopsy simultaneously.  We will obtain a full set of pulmonary function studies.  She will come on her next appointment with the images of the CT scan which are not available online to me to review.  I have reviewed the PET scan with her.  She will return later this week with full  pulmonary function studies and images to review and will make a final decision about resection.  I  spent 60 minutes with  the patient face to face and greater then 50% of the time was spent in counseling and coordination of care.    Grace Isaac MD      [address] [place], [address] Office [phone]   Beeper [phone]  02/06/2018 4:07 PM

## 2018-02-05 NOTE — Patient Instructions (Signed)
Pulmonary Nodule A pulmonary nodule is a small, round growth of tissue in the lung. Pulmonary nodules can range in size from less than 1/5 inch (4 mm) to a little bigger than an inch (25 mm). Most pulmonary nodules are detected when imaging tests of the lung are being performed for a different problem. Pulmonary nodules are usually not cancerous (benign). However, some pulmonary nodules are cancerous (malignant). Follow-up treatment or testing is based on the size of the pulmonary nodule and your risk of getting lung cancer. What are the causes? Benign pulmonary nodules can be caused by various things. Some of the causes include:  Bacterial, fungal, or viral infections. This is usually an old infection that is no longer active, but it can sometimes be a current, active infection.  A benign mass of tissue.  Inflammation from conditions such as rheumatoid arthritis.  Abnormal blood vessels in the lungs.  Malignant pulmonary nodules can result from lung cancer or from cancers that spread to the lung from other places in the body. What are the signs or symptoms? Pulmonary nodules usually do not cause symptoms. How is this diagnosed? Most often, pulmonary nodules are found incidentally when an X-ray or CT scan is performed to look for some other problem in the lung area. To help determine whether a pulmonary nodule is benign or malignant, your health care provider will take a medical history and order a variety of tests. Tests done may include:  Blood tests.  A skin test called a tuberculin test. This test is used to determine if you have been exposed to the germ that causes tuberculosis.  Chest X-rays. If possible, a new X-ray may be compared with X-rays you have had in the past.  CT scan. This test shows smaller pulmonary nodules more clearly than an X-ray.  Positron emission tomography (PET) scan. In this test, a safe amount of a radioactive substance is injected into the bloodstream. Then,  the scan takes a picture of the pulmonary nodule. The radioactive substance is eliminated from your body in your urine.  Biopsy. A tiny piece of the pulmonary nodule is removed so it can be checked under a microscope.  How is this treated? Pulmonary nodules that are benign normally do not require any treatment because they usually do not cause symptoms or breathing problems. Your health care provider may want to monitor the pulmonary nodule through follow-up CT scans. The frequency of these CT scans will vary based on the size of the nodule and the risk factors for lung cancer. For example, CT scans will need to be done more frequently if the pulmonary nodule is larger and if you have a history of smoking and a family history of cancer. Further testing or biopsies may be done if any follow-up CT scan shows that the size of the pulmonary nodule has increased. Follow these instructions at home:  Only take over-the-counter or prescription medicines as directed by your health care provider.  Keep all follow-up appointments with your health care provider. Contact a health care provider if:  You have trouble breathing when you are active.  You feel sick or unusually tired.  You do not feel like eating.  You lose weight without trying to.  You develop chills or night sweats. Get help right away if:  You cannot catch your breath, or you begin wheezing.  You cannot stop coughing.  You cough up blood.  You become dizzy or feel like you are going to pass out.  You  have sudden chest pain.  You have a fever or persistent symptoms for more than 2-3 days.  You have a fever and your symptoms suddenly get worse. This information is not intended to replace advice given to you by your health care provider. Make sure you discuss any questions you have with your health care provider. Document Released: 03/06/2009 Document Revised: 10/15/2015 Document Reviewed: 10/29/2012 Elsevier Interactive  Patient Education  2017 [employer] Lung cancer occurs when abnormal cells in the lung grow out of control and form a mass (tumor). There are several types of lung cancer. The two most common types are:  Non-small cell. In this type of lung cancer, abnormal cells are larger and grow more slowly than those of small cell lung cancer.  Small cell. In this type of lung cancer, abnormal cells are smaller than those of non-small cell lung cancer. Small cell lung cancer gets worse faster than non-small cell lung cancer.  What are the causes? The leading cause of lung cancer is smoking tobacco. The second leading cause is radon exposure. What increases the risk?  Smoking tobacco.  Exposure to secondhand tobacco smoke.  Exposure to radon gas.  Exposure to asbestos.  Exposure to arsenic in drinking water.  Air pollution.  Family or personal history of lung cancer.  Lung radiation therapy.  Being older than 73 years. What are the signs or symptoms? In the early stages, symptoms may not be present. As the cancer progresses, symptoms may include:  A lasting cough, possibly with blood.  Fatigue.  Unexplained weight loss.  Shortness of breath.  Wheezing.  Chest pain.  Loss of appetite.  Symptoms of advanced lung cancer include:  Hoarseness.  Bone or joint pain.  Weakness.  Nail problems.  Face or arm swelling.  Paralysis of the face.  Drooping eyelids.  How is this diagnosed? Lung cancer can be identified with a physical exam and with tests such as:  A chest X-ray.  A CT scan.  Blood tests.  A biopsy.  After a diagnosis is made, you will have more tests to determine the stage of the cancer. The stages of non-small cell lung cancer are:  Stage 0, also called carcinoma in situ. At this stage, abnormal cells are found in the inner lining of your lung or lungs.  Stage I. At this stage, abnormal cells have grown into a tumor that is no  larger than 5 cm across. The cancer has entered the deeper lung tissue but has not yet entered the lymph nodes or other parts of the body.  Stage II. At this stage, the tumor is 7 cm across or smaller and has entered nearby lymph nodes. Or, the tumor is 5 cm across or smaller and has invaded surrounding tissue but is not found in nearby lymph nodes. There may be more than one tumor present.  Stage III. At this stage, the tumor may be any size. There may be more than one tumor in the lungs. The cancer cells have spread to the lymph nodes and possibly to other organs.  Stage IV. At this stage, there are tumors in both lungs and the cancer has spread to other areas of the body.  The stages of small cell lung cancer are:  Limited. At this stage, the cancer is found only on one side of the chest.  Extensive. At this stage, the cancer is in the lungs and in tissues on the other side of the chest.  The cancer has spread to other organs or is found in the fluid between the layers of your lungs.  How is this treated? Depending on the type and stage of your lung cancer, you may be treated with:  Surgery. This is done to remove a tumor.  Radiation therapy. This treatment destroys cancer cells using X-rays or other types of radiation.  Chemotherapy. This treatment uses medicines to destroy cancer cells.  Targeted therapy. This treatment aims to destroy only cancer cells instead of all cells as other therapies do.  You may also have a combination of treatments. Follow these instructions at home:  Do not use any tobacco products. This includes cigarettes, chewing tobacco, and electronic cigarettes. If you need help quitting, ask your health care provider.  Take medicines only as directed by your health care provider.  Eat a healthy diet. Work with a dietitian to make sure you are getting the nutrition you need.  Consider joining a support group or seeking counseling to help you cope with the  stress of having lung cancer.  Let your cancer specialist (oncologist) know if you are admitted to the hospital.  Keep all follow-up visits as directed by your health care provider. This is important. Contact a health care provider if:  You lose weight without trying.  You have a persistent cough and wheezing.  You feel short of breath.  You tire easily.  You experience bone or joint pain.  You have difficulty swallowing.  You feel hoarse or notice your voice changing.  Your pain medicine is not helping. Get help right away if:  You cough up blood.  You have new breathing problems.  You develop chest pain.  You develop swelling in: ? One or both ankles or legs. ? Your face, neck, or arms.  You are confused.  You experience paralysis in your face or a drooping eyelid. This information is not intended to replace advice given to you by your health care provider. Make sure you discuss any questions you have with your health care provider. Document Released: 08/15/2000 Document Revised: 10/15/2015 Document Reviewed: 09/12/2013 Elsevier Interactive Patient Education  2018 [employer].   Lung Resection A lung resection is a procedure to remove part or all of a lung. When an entire lung is removed, the procedure is called a pneumonectomy. When only part of a lung is removed, the procedure is called a lobectomy. A lung resection is typically done to get rid of a tumor or cancer, but it may be done to treat other conditions. This procedure can help relieve some or all of your symptoms and can also help keep the problem from getting worse. Lung resection may provide the best chance for curing your disease. However, the procedure may not necessarily cure lung cancer if that is the problem. Tell a health care provider about:  Any allergies you have.  All medicines you are taking, including vitamins, herbs, eye drops, creams, and over-the-counter medicines.  Any problems you or  family members have had with anesthetic medicines.  Any blood disorders you have.  Any surgeries you have had.  Any medical conditions you have. What are the risks? Generally, lung resection is a safe procedure. However, problems can occur and include:  Excessive bleeding.  Infection.  Inability to breathe without a ventilator.  Persistent shortness of breath.  Heart problems, including abnormal rhythms and a risk of heart attack or heart failure.  Blood clots.  Injury to a blood vessel.  Injury to  a nerve.  Failure to heal properly.  Stroke.  Bronchopleural fistula. This is a small hole between one of the main breathing tubes (bronchus) and the lining of the lungs. This is rare.  Reaction to anesthesia.  What happens before the procedure? You may have tests done before the procedure, including:  Blood tests.  Urine tests.  X-rays.  Other imaging tests (such as CT scans, MRI scans, and PET scans). These tests are done to find the exact size and location of the condition being treated with this surgery.  Pulmonary function tests. These are breathing tests to assess the function of your lungs before surgery and to decide how to best help your breathing after surgery.  Heart testing. This is done to make sure your heart is strong enough for the procedure.  Bronchoscopy. This is a technique that allows your health care provider to look at the inside of your airways. This is done using a soft, flexible tube (bronchoscope). Along with imaging tests, this can help your health care provider know the exact location and size of the area that will be removed during surgery.  Lymph node sampling. This may need to be done to see if the tumor has spread. It may be done as a separate surgery or right before your lung resection procedure.  What happens during the procedure?  An IV tube will be placed in your arm. You will be given a medicine that makes you fall asleep (general  anesthetic). You may also get pain medicine through a thin, flexible tube (catheter) in your back.  A breathing tube will be placed in your throat.  Once the surgical team has prepared you for surgery, your surgeon will make an incision on your side. Some resections are done through large incisions, while others can be done through small incisions using smaller instruments and assisted with small cameras (laparoscopic surgery).  Your surgeon will carefully cut the veins, arteries, and bronchus leading to your lung. After being cut, each of these pieces will be sewn or stapled closed. The lung or part of the lung will then be removed.  Your surgeon will check inside your chest to make sure there is no bleeding in or around the lungs. Lymph nodes near the lung may also be removed for later tests.  Your surgeon may put tubes into your chest to drain extra fluid and air after surgery.  Your incision will be closed. This may be done using: ? Stitches that absorb into your body and do not need to be removed. ? Stitches that must be removed. ? Staples that must be removed. What happens after the procedure?  You will be taken to the recovery area and your progress will be monitored. You may still have a breathing tube and other tubes or catheters in your body immediately after surgery. These will be removed during your recovery. You may be put on a respirator following surgery if some assistance is needed to help your breathing. When you are awake and not experiencing immediate problems from surgery, you will be moved to the intensive care unit (ICU) where you will continue your recovery.  You may feel pain in your chest and throat. Sometimes during recovery, patients may shiver or feel nauseous. You will be given medicine to help with pain and nausea.  The breathing tube will be taken out as soon as your health care providers feel you can breathe on your own. For most people, this happens on the same  day as the surgery.  If your surgery and time in the ICU go well, most of the tubes and equipment will be taken out within 1-2 days after surgery. This is about how long most people stay in the ICU. You may need to stay longer, depending on how you are doing.  You should also start respiratory therapy in the ICU. This therapy uses breathing exercises to help your other lung stay healthy and get stronger.  As you improve, you will be moved to a regular hospital room for continued respiratory therapy, help with your bladder and bowels, and to continue medicines.  After your lung or part of your lung is taken out, there will be a space inside your chest. This space will often fill up with fluid over time. The amount of time this takes is different for each person.  You will receive care until you are doing well and your health care provider feels it is safe for you to go home or to transfer to an extended care facility. This information is not intended to replace advice given to you by your health care provider. Make sure you discuss any questions you have with your health care provider. Document Released: 07/30/2002 Document Revised: 10/15/2015 Document Reviewed: 06/28/2013 Elsevier Interactive Patient Education  2018 [employer].  Lung Resection, Care After Refer to this sheet in the next few weeks. These instructions provide you with information on caring for yourself after your procedure. Your health care provider may also give you more specific instructions. Your treatment has been planned according to current medical practices, but problems sometimes occur. Call your health care provider if you have any problems or questions after your procedure. What can I expect after the procedure? After your procedure, it is typical to have the following:  You may feel pain in your chest and throat.  Patients may sometimes shiver or feel nauseous during recovery.  Follow these instructions at  home:  You may resume a normal diet and activities as directed by your health care provider.  Do not use any tobacco products, including cigarettes, chewing tobacco, or electronic cigarettes. If you need help quitting, ask your health care provider.  There are many different ways to close and cover an incision, including stitches, skin glue, and adhesive strips. Follow your health care provider's instructions on: ? Incision care. ? Bandage (dressing) changes and removal. ? Incision closure removal.  Take medicines only as directed by your health care provider.  Keep all follow-up visits as directed by your health care provider. This is important.  Try to breathe deeply and cough as directed. Holding a pillow firmly over your ribs may help with discomfort.  If you were given an incentive spirometer in the hospital, continue to use it as directed by your health care provider.  Walk as directed by your health care provider.  You may take a shower and gently wash the area of your incision with water and soap as directed by your health care provider. Do not use anything else to clean your incision except as directed by your health care provider. Do not take baths, swim, or use a hot tub until your health care provider approves. Contact a health care provider if:  You notice redness, swelling, or increasing pain at the incision site.  You are bleeding at the incision site.  You see pus coming from the incision site.  You notice a bad smell coming from the incision site or bandage.  Your incision  breaks open.  You cough up blood or pus, or you develop a cough that produces bad-smelling sputum.  You have pain or swelling in your legs.  You have increasing pain that is not controlled with medicine.  You have trouble managing any of the tubes that have been left in place after surgery.  You have fever or chills. Get help right away if:  You have chest pain or an irregular or rapid  heartbeat.  You have dizzy episodes or faint.  You have shortness of breath or difficulty breathing.  You have persistent nausea or vomiting.  You have a rash. This information is not intended to replace advice given to you by your health care provider. Make sure you discuss any questions you have with your health care provider. Document Released: 11/26/2004 Document Revised: 10/15/2015 Document Reviewed: 06/28/2013 Elsevier Interactive Patient Education  2018 [employer].

## 2018-02-06 ENCOUNTER — Other Ambulatory Visit: Payer: Self-pay | Admitting: *Deleted

## 2018-02-06 DIAGNOSIS — R918 Other nonspecific abnormal finding of lung field: Secondary | ICD-10-CM

## 2018-02-06 LAB — PULMONARY FUNCTION TEST

## 2018-02-08 ENCOUNTER — Ambulatory Visit (HOSPITAL_COMMUNITY)
Admission: RE | Admit: 2018-02-08 | Discharge: 2018-02-08 | Disposition: A | Discharge status: Home / Self Care | Payer: Medicare PPO | Source: Ambulatory Visit | Attending: Cardiothoracic Surgery | Admitting: Cardiothoracic Surgery

## 2018-02-08 ENCOUNTER — Encounter: Payer: Self-pay | Admitting: Cardiothoracic Surgery

## 2018-02-08 ENCOUNTER — Ambulatory Visit: Payer: Medicare PPO | Admitting: Cardiothoracic Surgery

## 2018-02-08 ENCOUNTER — Other Ambulatory Visit: Payer: Self-pay

## 2018-02-08 VITALS — BP 142/88 | HR 80 | Resp 18 | Ht 64.0 in | Wt 155.0 lb

## 2018-02-08 DIAGNOSIS — R918 Other nonspecific abnormal finding of lung field: Secondary | ICD-10-CM

## 2018-02-08 DIAGNOSIS — R911 Solitary pulmonary nodule: Secondary | ICD-10-CM

## 2018-02-08 LAB — PULMONARY FUNCTION TEST
DL/VA % pred: 140 %
DL/VA: 6.73 ml/min/mmHg/L
DLCO unc % pred: 90 %
DLCO unc: 22.07 ml/min/mmHg
FEF 25-75 Post: 2.46 L/sec
FEF 25-75 Pre: 2.22 L/sec
FEF2575-%Change-Post: 10 %
FEF2575-%Pred-Post: 132 %
FEF2575-%Pred-Pre: 119 %
FEV1-%Change-Post: 4 %
FEV1-%Pred-Post: 92 %
FEV1-%Pred-Pre: 88 %
FEV1-Post: 1.81 L
FEV1-Pre: 1.72 L
FEV1FVC-%Change-Post: 4 %
FEV1FVC-%Pred-Pre: 106 %
FEV6-%Change-Post: 0 %
FEV6-%Pred-Post: 86 %
FEV6-%Pred-Pre: 85 %
FEV6-Post: 2.08 L
FEV6-Pre: 2.07 L
FEV6FVC-%Pred-Post: 104 %
FEV6FVC-%Pred-Pre: 104 %
FVC-%Change-Post: 0 %
FVC-%Pred-Post: 83 %
FVC-%Pred-Pre: 82 %
FVC-Post: 2.08 L
FVC-Pre: 2.07 L
Post FEV1/FVC ratio: 87 %
Post FEV6/FVC ratio: 100 %
Pre FEV1/FVC ratio: 83 %
Pre FEV6/FVC Ratio: 100 %
RV % pred: 83 %
RV: 1.75 L
TLC % pred: 74 %
TLC: 3.77 L

## 2018-02-08 MED ORDER — ALBUTEROL SULFATE (2.5 MG/3ML) 0.083% IN NEBU
2.5000 mg | INHALATION_SOLUTION | Freq: Once | RESPIRATORY_TRACT | Status: AC
  Administered 2018-02-08: 2.5 mg via RESPIRATORY_TRACT

## 2018-02-09 ENCOUNTER — Other Ambulatory Visit: Payer: Self-pay | Admitting: *Deleted

## 2018-02-09 DIAGNOSIS — R911 Solitary pulmonary nodule: Secondary | ICD-10-CM

## 2018-02-09 NOTE — Progress Notes (Signed)
[address]       [place] [address]             [phone]                    [place] Medical Record #[id number] Date of Birth: 09/18/1950  Referring: Gwenevere Ghazi, MD Primary Care: Stephens Shire, MD Primary Cardiologist: No primary care provider on file.  Chief Complaint:    Chief Complaint  Patient presents with  . Lung Lesion    f/u after PFTs, review studies CT and PET    History of Present Illness:    [place] 67 y.o. female is seen in the office  today for evaluation of a right lower lobe lung nodule.  The patient is states she has been a lifelong non-smoker.  She presented in June with upper respiratory complaints sinus drainage and cough.  Because of these complaints she was seen in urgent care and a chest x-ray was obtained, there was some opacity in the right lower lobe.  Subsequently the patient was referred to Dr. Glade Lloyd in [facility], CT scan of the chest and PET scan were performed.  Bronchoscopy was done that was unrevealing of any tissue diagnosis by Dr. Glade Lloyd.  This the plan was to obtain a follow-up CT scan in several months which was done last week.  The right lower lobe mass by report appears the same.  The patient will return and bring copies of the discs of her CT scans.  I have reviewed the PET scan images, and have requested pathology to amend the report as the lesion is in the right lower lobe not the right upper lobe.   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  Her husband has been a long-term smoker.  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  Currently she remains active walking up to 4 miles a day.   Patient returns to the office today with the CT scans done in [facility] last week for review.  In addition she had full pulmonary function studies done this morning for review.   Current Activity/ Functional  Status:  Patient is independent with mobility/ambulation, transfers, ADL's, IADL's.   Zubrod Score: At the time of surgery this patient's most appropriate activity status/level should be described as: [x]     0    Normal activity, no symptoms []     1    Restricted in physical strenuous activity but ambulatory, able to do out light work []     2    Ambulatory and capable of self care, unable to do work activities, up and about               >50 % of waking hours                              []     3    Only limited self care, in bed greater than 50% of waking hours []     4    Completely disabled, no self care, confined to bed or chair []     5    Moribund   Past Medical History:  Diagnosis Date  . Allergy   . Back pain   . Hypertension   . Hypertension   .  Lung mass     No past surgical history on file.  Family History  Problem Relation Age of Onset  . Arthritis Mother   . Hypertension Mother   . Stroke Mother   . Hypertension Sister    Patient's family history is for father who died in his 50s in a tractor accident.  Since somebody ran into the back of patient's mother died at age 7 with issues related to stroke she had known coronary occlusive disease, one brother died at age 70 of myocardial infarction she has 1 brother insists 5 sisters who are all lung.  One aunt, -sister of her mother died at age 62 of lung cancer she was a lifelong non-smoker   Social History   Tobacco Use  Smoking Status Never Smoker  Smokeless Tobacco Never Used    Social History   Substance and Sexual Activity  Alcohol Use No     Allergies  Allergen Reactions  . Penicillin G Itching    Current Outpatient Medications  Medication Sig Dispense Refill  . Travoprost, BAK Free, (TRAVATAN) 0.004 % SOLN ophthalmic solution Place 1 drop into both eyes at bedtime.     No current facility-administered medications for this visit.     Pertinent items are noted in HPI.   Review of Systems:      Cardiac Review of Systems: [Y] = yes  or   [ N ] = no   Chest Pain [ N ]  Resting SOB Aqua.Slicker ] Exertional SOB  [Y ]  Vertell Limber Aqua.Slicker ]   Pedal Edema Aqua.Slicker ]    Palpitations [ N] Syncope  [ N]   Presyncope [ N ]   General Review of Systems: [Y] = yes [  ]=no Constitional: recent weight change [  ];  Wt loss over the last 3 months [   ] anorexia [  ]; fatigue [  ]; nausea [  ]; night sweats [  ]; fever [  ]; or chills [  ];           Eye : blurred vision [  ]; diplopia [   ]; vision changes [  ];  Amaurosis fugax[  ]; Resp: cough [  ];  wheezing[  ];  hemoptysis[  ]; shortness of breath[  ]; paroxysmal nocturnal dyspnea[  ]; dyspnea on exertion[  ]; or orthopnea[  ];  GI:  gallstones[  ], vomiting[  ];  dysphagia[  ]; melena[  ];  hematochezia [  ]; heartburn[  ];   Hx of  Colonoscopy[ y ]; GU: kidney stones [  ]; hematuria[  ];   dysuria [  ];  nocturia[  ];  history of     obstruction [  ]; urinary frequency [  ]             Skin: rash, swelling[  ];, hair loss[  ];  peripheral edema[  ];  or itching[  ]; Musculosketetal: myalgias[  ];  joint swelling[  ];  joint erythema[  ];  joint pain[  ];  back pain[  ];  Heme/Lymph: bruising[  ];  bleeding[  ];  anemia[  ];  Neuro: TIA[  ];  headaches[  ];  stroke[  ];  vertigo[  ];  seizures[  ];   paresthesias[  ];  difficulty walking[  ];  Psych:depression[  ]; anxiety[  ];  Endocrine: diabetes[  ];  thyroid dysfunction[  ];  Immunizations: Flu up to date [  ];  Pneumococcal up to date [  ];  Other:    PHYSICAL EXAMINATION: BP (!) 142/88 (BP Location: Left Arm, Patient Position: Sitting, Cuff Size: Normal)   Pulse 80   Resp 18   Ht 5\' 4"  (1.626 m)   Wt 155 lb (70.3 kg)   SpO2 95% Comment: RA  BMI 26.61 kg/m  General appearance: alert, cooperative and no distress Head: Normocephalic, without obvious abnormality, atraumatic Neck: no adenopathy, no carotid bruit, no JVD, supple, symmetrical, trachea midline and thyroid not enlarged, symmetric, no  tenderness/mass/nodules Lymph nodes: Cervical, supraclavicular, and axillary nodes normal. Resp: clear to auscultation bilaterally Cardio: regular rate and rhythm, S1, S2 normal, no murmur, click, rub or gallop GI: soft, non-tender; bowel sounds normal; no masses,  no organomegaly Extremities: extremities normal, atraumatic, no cyanosis or edema and Homans sign is negative, no sign of DVT Neurologic: Grossly normal  Diagnostic Studies & Laboratory data:     Recent Radiology Findings:   CLINICAL DATA: Initial treatment strategy for pulmonary nodule.  Final Report  ADDENDUM REPORT: 02/05/2018 15:37  ADDENDUM: Correction :  IMPRESSION:  Hypermetabolic RIGHT LOWER lobe nodule.  EXAM: NUCLEAR MEDICINE PET SKULL BASE TO THIGH  TECHNIQUE: Fifteen mCi F-18 FDG was injected intravenously. Full-ring PET imaging was performed from the skull base to thigh after the radiotracer. CT data was obtained and used for attenuation correction and anatomic localization.  Fasting blood glucose: 94 mg/dl  COMPARISON: None available  FINDINGS: Mediastinal blood pool activity: SUV max 1. Is 64  NECK: No hypermetabolic lymph nodes in the neck.  Incidental CT findings: none  CHEST: Within the RIGHT lower lobe 18 mm nodule above the RIGHT hemidiaphragm has associated metabolic activity with SUV max equal 4.8 no additional pulmonary nodules. No hypermetabolic mediastinal lymph nodes.  Incidental CT findings: None  ABDOMEN/PELVIS: No abnormal hypermetabolic activity within the liver, pancreas, adrenal glands, or spleen. No hypermetabolic lymph nodes in the abdomen or pelvis. No adnexal abnormality.  Incidental CT findings: none  SKELETON: No focal hypermetabolic activity to suggest skeletal metastasis.  Incidental CT findings: none  IMPRESSION: 1. Hypermetabolic RIGHT upper lobe pulmonary nodule is concerning for bronchogenic carcinoma. If no smoking history, consider a focus of  pulmonary infection. As lesion would be difficult for percutaneous biopsy, consider short-term CT follow-up (6 weeks). If more immediate tissue sampling is warranted, consider bronchoscopy. 2. No hypermetabolic mediastinal nodes.   Electronically Signed By: Suzy Bouchard M.D. On: 11/17/2017 15:56            I have independently reviewed the above radiology studies  and reviewed the findings with the patient.   Recent Lab Findings: Lab Results  Component Value Date   WBC 5.3 08/01/2012   HGB 13.5 08/01/2012   HCT 43.5 08/01/2012   GLUCOSE 82 08/06/2015   ALT 14 08/06/2015   AST 16 08/06/2015   NA 143 08/06/2015   K 4.0 08/06/2015   CL 102 08/06/2015   CREATININE 0.83 08/06/2015   BUN 12 08/06/2015   CO2 27 08/06/2015   TSH 1.532 08/01/2012   PFT's  FEV1 1.72 88% DLCO 22.07  90% Interpretation: The FVC, FEV1, FEV1/FVC ratio and FEF25-75% are within normal limits, but there is curvature to the flow volume loop suggesting minimal small airway disease. Patient effort was erratic, but the normal FEV1 indicates no significant obstruction. The airway resistance is normal. Following administration of bronchodilators, there is no significant response. The diffusing capacity is normal. However, the diffusing capacity was not corrected for the  patient's hemoglobin. Conclusions: Minimal airway obstruction is present. In addition, lung volumes are reduced indicating a concurrent restrictive process. 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 -She is been referred for consideration of surgical resection.  I discussed with her the pros and cons of attempting a biopsy versus a surgical resection and biopsy simultaneously.  The CT  scan disc she brought from [facility] were reviewed today.  Her PFTs are adequate for resection.  I recommended to her that we proceed with bronchoscopy right video-assisted thoracoscopy with lung resection, both for diagnostic reasons and therapeutic reasons if the mass is malignant. Risks and options of surgery have been discussed with her and detail and she is willing to proceed.  Tentatively plan for September 25    Grace Isaac MD      [address] [place] [address] Office [phone]   Beeper [phone]  02/09/2018 12:50 PM

## 2018-02-12 ENCOUNTER — Encounter: Payer: Self-pay | Admitting: Thoracic Surgery (Cardiothoracic Vascular Surgery)

## 2018-02-13 ENCOUNTER — Encounter (HOSPITAL_COMMUNITY): Payer: Self-pay

## 2018-02-13 ENCOUNTER — Other Ambulatory Visit: Payer: Self-pay

## 2018-02-13 ENCOUNTER — Ambulatory Visit (HOSPITAL_COMMUNITY)
Admission: RE | Admit: 2018-02-13 | Discharge: 2018-02-13 | Disposition: A | Discharge status: Home / Self Care | Payer: Medicare PPO | Source: Ambulatory Visit | Attending: Cardiothoracic Surgery | Admitting: Cardiothoracic Surgery

## 2018-02-13 ENCOUNTER — Encounter (HOSPITAL_COMMUNITY)
Admission: RE | Admit: 2018-02-13 | Discharge: 2018-02-13 | Disposition: A | Discharge status: Home / Self Care | Payer: Medicare PPO | Source: Ambulatory Visit | Attending: Cardiothoracic Surgery | Admitting: Cardiothoracic Surgery

## 2018-02-13 DIAGNOSIS — R911 Solitary pulmonary nodule: Secondary | ICD-10-CM

## 2018-02-13 DIAGNOSIS — R001 Bradycardia, unspecified: Secondary | ICD-10-CM

## 2018-02-13 DIAGNOSIS — Z01812 Encounter for preprocedural laboratory examination: Secondary | ICD-10-CM

## 2018-02-13 DIAGNOSIS — Z01810 Encounter for preprocedural cardiovascular examination: Secondary | ICD-10-CM

## 2018-02-13 DIAGNOSIS — I700 Atherosclerosis of aorta: Secondary | ICD-10-CM

## 2018-02-13 HISTORY — DX: Presence of spectacles and contact lenses: Z97.3

## 2018-02-13 HISTORY — DX: Presence of dental prosthetic device (complete) (partial): Z97.2

## 2018-02-13 LAB — COMPREHENSIVE METABOLIC PANEL
ALT: 17 U/L (ref 0–44)
AST: 19 U/L (ref 15–41)
Albumin: 3.8 g/dL (ref 3.5–5.0)
Alkaline Phosphatase: 62 U/L (ref 38–126)
Anion gap: 7 (ref 5–15)
BUN: 11 mg/dL (ref 8–23)
CO2: 25 mmol/L (ref 22–32)
Calcium: 9 mg/dL (ref 8.9–10.3)
Chloride: 110 mmol/L (ref 98–111)
Creatinine, Ser: 0.84 mg/dL (ref 0.44–1.00)
GFR calc Af Amer: >60 mL/min (ref >60)
GFR calc non Af Amer: >60 mL/min (ref >60)
Glucose, Bld: 98 mg/dL (ref 70–99)
Potassium: 3.9 mmol/L (ref 3.5–5.1)
Sodium: 142 mmol/L (ref 135–145)
Total Bilirubin: 1.1 mg/dL (ref 0.3–1.2)
Total Protein: 6.4 g/dL — ABNORMAL LOW (ref 6.5–8.1)

## 2018-02-13 LAB — URINALYSIS, ROUTINE W REFLEX MICROSCOPIC
Bilirubin Urine: NEGATIVE
Glucose, UA: NEGATIVE mg/dL
Hgb urine dipstick: NEGATIVE
Ketones, ur: NEGATIVE mg/dL
Leukocytes, UA: NEGATIVE
Nitrite: NEGATIVE
Protein, ur: NEGATIVE mg/dL
Specific Gravity, Urine: 1.017 (ref 1.005–1.030)
pH: 7 (ref 5.0–8.0)

## 2018-02-13 LAB — ABO/RH: ABO/RH(D): A POS

## 2018-02-13 LAB — BLOOD GAS, ARTERIAL
Acid-Base Excess: 2.8 mmol/L — ABNORMAL HIGH (ref 0.0–2.0)
Bicarbonate: 27 mmol/L (ref 20.0–28.0)
FIO2: 0.21
O2 Saturation: 97.3 %
Patient temperature: 98.6
pCO2 arterial: 42.5 mmHg (ref 32.0–48.0)
pH, Arterial: 7.419 (ref 7.350–7.450)
pO2, Arterial: 95.1 mmHg (ref 83.0–108.0)

## 2018-02-13 LAB — CBC
HCT: 41.7 % (ref 36.0–46.0)
Hemoglobin: 13.1 g/dL (ref 12.0–15.0)
MCH: 28.9 pg (ref 26.0–34.0)
MCHC: 31.4 g/dL (ref 30.0–36.0)
MCV: 91.9 fL (ref 78.0–100.0)
Platelets: 221 10*3/uL (ref 150–400)
RBC: 4.54 MIL/uL (ref 3.87–5.11)
RDW: 13.2 % (ref 11.5–15.5)
WBC: 4.2 10*3/uL (ref 4.0–10.5)

## 2018-02-13 LAB — SURGICAL PCR SCREEN
MRSA, PCR: NEGATIVE
Staphylococcus aureus: NEGATIVE

## 2018-02-13 LAB — TYPE AND SCREEN
ABO/RH(D): A POS
Antibody Screen: NEGATIVE

## 2018-02-13 LAB — PROTIME-INR
INR: 1
Prothrombin Time: 13.1 seconds (ref 11.4–15.2)

## 2018-02-13 LAB — APTT: aPTT: 32 seconds (ref 24–36)

## 2018-02-13 NOTE — Pre-Procedure Instructions (Signed)
   LADONNA VANORDER  02/13/2018    [id number] [place]  [place] [address] Phone: [phone] Fax: [phone]   Your procedure is scheduled on Wednesday, February 14, 2018  Report to [facility] Admitting at 8:30 A.M.  Call this number if you have problems the morning of surgery:  [phone]   Remember:  Do not eat or drink after midnight.  Take these medicines the morning of surgery with A SIP OF WATER : none  Stop taking vitamins, fish oil and herbal medications. Do not take any NSAIDs ie: Ibuprofen, Advil, Naproxen (Aleve), Motrin, BC and Goody Powder; stop now  Do not wear jewelry, make-up or nail polish.  Do not wear lotions, powders, or perfumes, or deodorant.  Do not shave 48 hours prior to surgery.    Do not bring valuables to the hospital.  [facility] is not responsible for any belongings or valuables.  Contacts, dentures or bridgework may not be worn into surgery.  Leave your suitcase in the car.  After surgery it may be brought to your room. Patients discharged the day of surgery will not be allowed to drive home.  Special instructions:Shower the night before and morning of surgery with CHG. Please read over the following fact sheets that you were given. Pain Booklet, Coughing and Deep Breathing, MRSA Information and Surgical Site Infection Prevention

## 2018-02-13 NOTE — Progress Notes (Signed)
Pt denies SOB, chest pain, and being under the care of a cardiologist. Pt denies having a stress test, echo and cardiac cath. Pt denies having an EKG within the last month. Pt denies having a chest x ray within the last 72 hours. Pt denies labs within the last 7 days. Karoline Caldwell, PA, Anesthesia, reviewed EKG and stated that it is " okay."  PA gave no new orders.

## 2018-02-14 ENCOUNTER — Encounter (HOSPITAL_COMMUNITY): Payer: Self-pay | Admitting: *Deleted

## 2018-02-14 ENCOUNTER — Inpatient Hospital Stay (HOSPITAL_COMMUNITY)
Admission: RE | Admit: 2018-02-14 | Discharge: 2018-02-18 | DRG: 164 | Disposition: A | Discharge status: Home / Self Care | Payer: Medicare PPO | Source: Ambulatory Visit | Attending: Cardiothoracic Surgery | Admitting: Cardiothoracic Surgery

## 2018-02-14 ENCOUNTER — Inpatient Hospital Stay (HOSPITAL_COMMUNITY): Payer: Medicare PPO | Admitting: Anesthesiology

## 2018-02-14 ENCOUNTER — Encounter (HOSPITAL_COMMUNITY)
Admission: RE | Disposition: A | Discharge status: Home / Self Care | Payer: Self-pay | Source: Ambulatory Visit | Attending: Cardiothoracic Surgery

## 2018-02-14 ENCOUNTER — Inpatient Hospital Stay (HOSPITAL_COMMUNITY): Payer: Medicare PPO

## 2018-02-14 ENCOUNTER — Other Ambulatory Visit: Payer: Self-pay

## 2018-02-14 DIAGNOSIS — Z9689 Presence of other specified functional implants: Secondary | ICD-10-CM

## 2018-02-14 DIAGNOSIS — Z902 Acquired absence of lung [part of]: Secondary | ICD-10-CM

## 2018-02-14 DIAGNOSIS — D62 Acute posthemorrhagic anemia: Secondary | ICD-10-CM | POA: Diagnosis not present

## 2018-02-14 DIAGNOSIS — C3431 Malignant neoplasm of lower lobe, right bronchus or lung: Secondary | ICD-10-CM | POA: Diagnosis present

## 2018-02-14 DIAGNOSIS — R911 Solitary pulmonary nodule: Secondary | ICD-10-CM

## 2018-02-14 DIAGNOSIS — I10 Essential (primary) hypertension: Secondary | ICD-10-CM | POA: Diagnosis present

## 2018-02-14 DIAGNOSIS — Z801 Family history of malignant neoplasm of trachea, bronchus and lung: Secondary | ICD-10-CM

## 2018-02-14 DIAGNOSIS — Z23 Encounter for immunization: Secondary | ICD-10-CM

## 2018-02-14 DIAGNOSIS — R001 Bradycardia, unspecified: Secondary | ICD-10-CM | POA: Diagnosis present

## 2018-02-14 DIAGNOSIS — Z880 Allergy status to penicillin: Secondary | ICD-10-CM | POA: Diagnosis not present

## 2018-02-14 HISTORY — PX: VIDEO ASSISTED THORACOSCOPY (VATS)/WEDGE RESECTION: SHX6174

## 2018-02-14 HISTORY — PX: VIDEO BRONCHOSCOPY: SHX5072

## 2018-02-14 LAB — GLUCOSE, CAPILLARY: Glucose-Capillary: 119 mg/dL — ABNORMAL HIGH (ref 70–99)

## 2018-02-14 SURGERY — BRONCHOSCOPY, VIDEO-ASSISTED
Anesthesia: General | Site: Chest | Laterality: Right

## 2018-02-14 MED ORDER — LIDOCAINE 2% (20 MG/ML) 5 ML SYRINGE
INTRAMUSCULAR | Status: DC | PRN
  Administered 2018-02-14: 100 mg via INTRAVENOUS

## 2018-02-14 MED ORDER — MIDAZOLAM HCL 5 MG/5ML IJ SOLN
INTRAMUSCULAR | Status: DC | PRN
  Administered 2018-02-14 (×2): 1 mg via INTRAVENOUS

## 2018-02-14 MED ORDER — SUGAMMADEX SODIUM 200 MG/2ML IV SOLN
INTRAVENOUS | Status: DC | PRN
  Administered 2018-02-14: 200 mg via INTRAVENOUS

## 2018-02-14 MED ORDER — 0.9 % SODIUM CHLORIDE (POUR BTL) OPTIME
TOPICAL | Status: DC | PRN
  Administered 2018-02-14 (×2): 1000 mL

## 2018-02-14 MED ORDER — ONDANSETRON HCL 4 MG/2ML IJ SOLN
INTRAMUSCULAR | Status: AC
  Filled 2018-02-14: qty 2

## 2018-02-14 MED ORDER — LATANOPROST 0.005 % OP SOLN
1.0000 [drp] | Freq: Every day | OPHTHALMIC | Status: DC
  Administered 2018-02-15 – 2018-02-17 (×3): 1 [drp] via OPHTHALMIC
  Filled 2018-02-14 (×2): qty 2.5

## 2018-02-14 MED ORDER — ACETAMINOPHEN 500 MG PO TABS
1000.0000 mg | ORAL_TABLET | Freq: Four times a day (QID) | ORAL | Status: DC
  Administered 2018-02-14 – 2018-02-17 (×9): 1000 mg via ORAL
  Administered 2018-02-17: 500 mg via ORAL
  Administered 2018-02-17 – 2018-02-18 (×2): 1000 mg via ORAL
  Filled 2018-02-14 (×12): qty 2

## 2018-02-14 MED ORDER — VANCOMYCIN HCL IN DEXTROSE 1-5 GM/200ML-% IV SOLN
1000.0000 mg | Freq: Two times a day (BID) | INTRAVENOUS | Status: AC
  Administered 2018-02-14: 1000 mg via INTRAVENOUS
  Filled 2018-02-14: qty 200

## 2018-02-14 MED ORDER — VANCOMYCIN HCL IN DEXTROSE 1-5 GM/200ML-% IV SOLN
1000.0000 mg | INTRAVENOUS | Status: AC
  Administered 2018-02-14: 1000 mg via INTRAVENOUS

## 2018-02-14 MED ORDER — DIPHENHYDRAMINE HCL 12.5 MG/5ML PO ELIX
12.5000 mg | ORAL_SOLUTION | Freq: Four times a day (QID) | ORAL | Status: DC | PRN
  Filled 2018-02-14: qty 5

## 2018-02-14 MED ORDER — FENTANYL 40 MCG/ML IV SOLN
INTRAVENOUS | Status: DC
  Administered 2018-02-14: 1000 ug via INTRAVENOUS
  Administered 2018-02-14: 15 ug via INTRAVENOUS
  Administered 2018-02-15: 0 ug via INTRAVENOUS
  Administered 2018-02-15: 75 ug via INTRAVENOUS
  Administered 2018-02-15: 30 ug via INTRAVENOUS
  Administered 2018-02-15: 90 ug via INTRAVENOUS
  Administered 2018-02-15: 45 ug via INTRAVENOUS
  Administered 2018-02-15: 15 ug via INTRAVENOUS
  Administered 2018-02-15: 68.04 ug via INTRAVENOUS
  Administered 2018-02-16: 60 ug via INTRAVENOUS
  Administered 2018-02-16 (×2): 45 ug via INTRAVENOUS
  Administered 2018-02-16: 30 ug via INTRAVENOUS
  Administered 2018-02-16: 90 ug via INTRAVENOUS
  Administered 2018-02-17: 120 ug via INTRAVENOUS
  Administered 2018-02-17: 30 ug via INTRAVENOUS
  Administered 2018-02-17: 0 ug via INTRAVENOUS
  Filled 2018-02-14 (×3): qty 25

## 2018-02-14 MED ORDER — VANCOMYCIN HCL IN DEXTROSE 1-5 GM/200ML-% IV SOLN
INTRAVENOUS | Status: AC
  Filled 2018-02-14: qty 200

## 2018-02-14 MED ORDER — DEXAMETHASONE SODIUM PHOSPHATE 10 MG/ML IJ SOLN
INTRAMUSCULAR | Status: AC
  Filled 2018-02-14: qty 1

## 2018-02-14 MED ORDER — FENTANYL CITRATE (PF) 250 MCG/5ML IJ SOLN
INTRAMUSCULAR | Status: AC
  Filled 2018-02-14: qty 5

## 2018-02-14 MED ORDER — POTASSIUM CHLORIDE 10 MEQ/50ML IV SOLN: 10.0000 meq | Freq: Every day | INTRAVENOUS | Status: DC | PRN

## 2018-02-14 MED ORDER — OXYCODONE HCL 5 MG PO TABS
5.0000 mg | ORAL_TABLET | ORAL | Status: DC | PRN
  Administered 2018-02-17: 5 mg via ORAL
  Filled 2018-02-14: qty 2
  Filled 2018-02-14: qty 1

## 2018-02-14 MED ORDER — FENTANYL CITRATE (PF) 100 MCG/2ML IJ SOLN: 25.0000 ug | INTRAMUSCULAR | Status: DC | PRN

## 2018-02-14 MED ORDER — PROPOFOL 10 MG/ML IV BOLUS
INTRAVENOUS | Status: AC
  Filled 2018-02-14: qty 20

## 2018-02-14 MED ORDER — FENTANYL CITRATE (PF) 250 MCG/5ML IJ SOLN
INTRAMUSCULAR | Status: DC | PRN
  Administered 2018-02-14 (×2): 100 ug via INTRAVENOUS
  Administered 2018-02-14 (×3): 50 ug via INTRAVENOUS

## 2018-02-14 MED ORDER — BUPIVACAINE HCL (PF) 0.5 % IJ SOLN: INTRAMUSCULAR | Status: DC | PRN

## 2018-02-14 MED ORDER — GLYCOPYRROLATE PF 0.2 MG/ML IJ SOSY
PREFILLED_SYRINGE | INTRAMUSCULAR | Status: AC
  Filled 2018-02-14: qty 1

## 2018-02-14 MED ORDER — MIDAZOLAM HCL 2 MG/2ML IJ SOLN
INTRAMUSCULAR | Status: AC
  Filled 2018-02-14: qty 2

## 2018-02-14 MED ORDER — BUPIVACAINE 0.5 % ON-Q PUMP SINGLE CATH 400 ML
400.0000 mL | INJECTION | Status: DC
  Filled 2018-02-14: qty 400

## 2018-02-14 MED ORDER — NALOXONE HCL 0.4 MG/ML IJ SOLN: 0.4000 mg | INTRAMUSCULAR | Status: DC | PRN

## 2018-02-14 MED ORDER — DEXAMETHASONE SODIUM PHOSPHATE 10 MG/ML IJ SOLN
INTRAMUSCULAR | Status: DC | PRN
  Administered 2018-02-14: 10 mg via INTRAVENOUS

## 2018-02-14 MED ORDER — PROPOFOL 10 MG/ML IV BOLUS
INTRAVENOUS | Status: DC | PRN
  Administered 2018-02-14: 50 mg via INTRAVENOUS
  Administered 2018-02-14: 200 mg via INTRAVENOUS

## 2018-02-14 MED ORDER — BISACODYL 5 MG PO TBEC
10.0000 mg | DELAYED_RELEASE_TABLET | Freq: Every day | ORAL | Status: DC
  Administered 2018-02-15 – 2018-02-18 (×4): 10 mg via ORAL
  Filled 2018-02-14 (×5): qty 2

## 2018-02-14 MED ORDER — SODIUM CHLORIDE 0.9% FLUSH: 9.0000 mL | INTRAVENOUS | Status: DC | PRN

## 2018-02-14 MED ORDER — PROMETHAZINE HCL 25 MG/ML IJ SOLN: 6.2500 mg | INTRAMUSCULAR | Status: DC | PRN

## 2018-02-14 MED ORDER — TRAMADOL HCL 50 MG PO TABS: 50.0000 mg | ORAL_TABLET | Freq: Four times a day (QID) | ORAL | Status: DC | PRN

## 2018-02-14 MED ORDER — GLYCOPYRROLATE PF 0.2 MG/ML IJ SOSY
PREFILLED_SYRINGE | INTRAMUSCULAR | Status: DC | PRN
  Administered 2018-02-14 (×2): .1 mg via INTRAVENOUS

## 2018-02-14 MED ORDER — INSULIN ASPART 100 UNIT/ML ~~LOC~~ SOLN
0.0000 [IU] | SUBCUTANEOUS | Status: DC
  Administered 2018-02-15: 2 [IU] via SUBCUTANEOUS
  Administered 2018-02-15: 8 [IU] via SUBCUTANEOUS
  Administered 2018-02-15: 2 [IU] via SUBCUTANEOUS

## 2018-02-14 MED ORDER — DIPHENHYDRAMINE HCL 50 MG/ML IJ SOLN: 12.5000 mg | Freq: Four times a day (QID) | INTRAMUSCULAR | Status: DC | PRN

## 2018-02-14 MED ORDER — SODIUM CHLORIDE 0.9 % IV SOLN
INTRAVENOUS | Status: DC | PRN
  Administered 2018-02-14: 20 ug/min via INTRAVENOUS

## 2018-02-14 MED ORDER — HEMOSTATIC AGENTS (NO CHARGE) OPTIME
TOPICAL | Status: DC | PRN
  Administered 2018-02-14: 1 via TOPICAL

## 2018-02-14 MED ORDER — PHENYLEPHRINE 40 MCG/ML (10ML) SYRINGE FOR IV PUSH (FOR BLOOD PRESSURE SUPPORT)
PREFILLED_SYRINGE | INTRAVENOUS | Status: AC
  Filled 2018-02-14: qty 10

## 2018-02-14 MED ORDER — SENNOSIDES-DOCUSATE SODIUM 8.6-50 MG PO TABS
1.0000 | ORAL_TABLET | Freq: Every day | ORAL | Status: DC
  Administered 2018-02-17: 1 via ORAL
  Filled 2018-02-14: qty 1

## 2018-02-14 MED ORDER — ONDANSETRON HCL 4 MG/2ML IJ SOLN
4.0000 mg | Freq: Four times a day (QID) | INTRAMUSCULAR | Status: DC | PRN
  Administered 2018-02-16: 4 mg via INTRAVENOUS
  Filled 2018-02-14: qty 2

## 2018-02-14 MED ORDER — BUPIVACAINE 0.5 % ON-Q PUMP SINGLE CATH 400 ML
INJECTION | Status: AC | PRN
  Administered 2018-02-14: 400 mL

## 2018-02-14 MED ORDER — ROCURONIUM BROMIDE 10 MG/ML (PF) SYRINGE
PREFILLED_SYRINGE | INTRAVENOUS | Status: DC | PRN
  Administered 2018-02-14: 20 mg via INTRAVENOUS
  Administered 2018-02-14: 10 mg via INTRAVENOUS
  Administered 2018-02-14: 20 mg via INTRAVENOUS
  Administered 2018-02-14: 50 mg via INTRAVENOUS

## 2018-02-14 MED ORDER — ACETAMINOPHEN 160 MG/5ML PO SOLN: 1000.0000 mg | Freq: Four times a day (QID) | ORAL | Status: DC

## 2018-02-14 MED ORDER — LACTATED RINGERS IV SOLN
INTRAVENOUS | Status: DC | PRN
  Administered 2018-02-14: 12:00:00 via INTRAVENOUS

## 2018-02-14 MED ORDER — DEXTROSE-NACL 5-0.45 % IV SOLN
INTRAVENOUS | Status: DC
  Administered 2018-02-14: 21:00:00 via INTRAVENOUS

## 2018-02-14 MED ORDER — HYDROMORPHONE HCL 1 MG/ML IJ SOLN
INTRAMUSCULAR | Status: AC
  Filled 2018-02-14: qty 1

## 2018-02-14 MED ORDER — DEXMEDETOMIDINE HCL IN NACL 200 MCG/50ML IV SOLN
INTRAVENOUS | Status: DC | PRN
  Administered 2018-02-14: 4 ug via INTRAVENOUS
  Administered 2018-02-14: 8 ug via INTRAVENOUS

## 2018-02-14 MED ORDER — ROCURONIUM BROMIDE 50 MG/5ML IV SOSY
PREFILLED_SYRINGE | INTRAVENOUS | Status: AC
  Filled 2018-02-14: qty 5

## 2018-02-14 MED ORDER — ENOXAPARIN SODIUM 40 MG/0.4ML ~~LOC~~ SOLN
40.0000 mg | Freq: Every day | SUBCUTANEOUS | Status: DC
  Administered 2018-02-15 – 2018-02-18 (×4): 40 mg via SUBCUTANEOUS
  Filled 2018-02-14 (×4): qty 0.4

## 2018-02-14 MED ORDER — BUPIVACAINE HCL (PF) 0.5 % IJ SOLN
INTRAMUSCULAR | Status: AC
  Filled 2018-02-14: qty 10

## 2018-02-14 MED ORDER — MIDAZOLAM HCL 5 MG/5ML IJ SOLN: INTRAMUSCULAR | Status: DC | PRN

## 2018-02-14 MED ORDER — FENTANYL CITRATE (PF) 100 MCG/2ML IJ SOLN
INTRAMUSCULAR | Status: AC
  Filled 2018-02-14: qty 2

## 2018-02-14 MED ORDER — LACTATED RINGERS IV SOLN
INTRAVENOUS | Status: DC
  Administered 2018-02-14 (×2): via INTRAVENOUS

## 2018-02-14 MED ORDER — HYDROMORPHONE HCL 1 MG/ML IJ SOLN
0.2500 mg | INTRAMUSCULAR | Status: DC | PRN
  Administered 2018-02-14 (×2): 0.5 mg via INTRAVENOUS

## 2018-02-14 MED ORDER — ONDANSETRON HCL 4 MG/2ML IJ SOLN
INTRAMUSCULAR | Status: DC | PRN
  Administered 2018-02-14: 4 mg via INTRAVENOUS

## 2018-02-14 SURGICAL SUPPLY — 106 items
ADH SKN CLS APL DERMABOND .7 (GAUZE/BANDAGES/DRESSINGS) ×2
APL SRG 22X2 LUM MLBL SLNT (VASCULAR PRODUCTS)
APL SRG 7X2 LUM MLBL SLNT (VASCULAR PRODUCTS)
APPLICATOR TIP COSEAL (VASCULAR PRODUCTS) IMPLANT
APPLICATOR TIP EXT COSEAL (VASCULAR PRODUCTS) IMPLANT
BAG SPEC RTRVL LRG 6X4 10 (ENDOMECHANICALS) ×2
BIT DRILL 5/64X5 DISP (BIT) ×1 IMPLANT
BLADE SURG 11 STRL SS (BLADE) IMPLANT
BRUSH CYTOL CELLEBRITY 1.5X140 (MISCELLANEOUS) IMPLANT
CANISTER SUCT 3000ML PPV (MISCELLANEOUS) ×3 IMPLANT
CATH KIT ON Q 5IN SLV (PAIN MANAGEMENT) IMPLANT
CATH KIT ON-Q SILVERSOAK 5 (CATHETERS) IMPLANT
CATH KIT ON-Q SILVERSOAK 5IN (CATHETERS) ×3 IMPLANT
CATH THORACIC 28FR (CATHETERS) IMPLANT
CATH THORACIC 36FR (CATHETERS) IMPLANT
CATH THORACIC 36FR RT ANG (CATHETERS) IMPLANT
CLIP VESOCCLUDE MED 6/CT (CLIP) ×2 IMPLANT
CONN ST 1/4X3/8  BEN (MISCELLANEOUS) ×1
CONN ST 1/4X3/8 BEN (MISCELLANEOUS) IMPLANT
CONT SPEC 4OZ CLIKSEAL STRL BL (MISCELLANEOUS) ×13 IMPLANT
COVER BACK TABLE 60X90IN (DRAPES) ×3 IMPLANT
CUTTER ECHEON FLEX ENDO 45 340 (ENDOMECHANICALS) ×1 IMPLANT
DERMABOND ADVANCED (GAUZE/BANDAGES/DRESSINGS) ×1
DERMABOND ADVANCED .7 DNX12 (GAUZE/BANDAGES/DRESSINGS) IMPLANT
DRAIN CHANNEL 28F RND 3/8 FF (WOUND CARE) IMPLANT
DRAIN CHANNEL 32F RND 10.7 FF (WOUND CARE) IMPLANT
DRAPE LAPAROSCOPIC ABDOMINAL (DRAPES) ×3 IMPLANT
DRAPE WARM FLUID 44X44 (DRAPE) ×2 IMPLANT
DRILL BIT 7/64X5 (BIT) IMPLANT
ELECT BLADE 4.0 EZ CLEAN MEGAD (MISCELLANEOUS) ×3
ELECT BLADE 6.5 EXT (BLADE) ×3 IMPLANT
ELECT REM PT RETURN 9FT ADLT (ELECTROSURGICAL) ×3
ELECTRODE BLDE 4.0 EZ CLN MEGD (MISCELLANEOUS) ×2 IMPLANT
ELECTRODE REM PT RTRN 9FT ADLT (ELECTROSURGICAL) ×2 IMPLANT
FELT TEFLON 1X6 (MISCELLANEOUS) ×1 IMPLANT
FORCEPS BIOP RJ4 1.8 (CUTTING FORCEPS) IMPLANT
GAUZE SPONGE 4X4 12PLY STRL (GAUZE/BANDAGES/DRESSINGS) ×2 IMPLANT
GAUZE SPONGE 4X4 12PLY STRL LF (GAUZE/BANDAGES/DRESSINGS) ×1 IMPLANT
GLOVE BIO SURGEON STRL SZ 6.5 (GLOVE) ×6 IMPLANT
GOWN STRL REUS W/ TWL LRG LVL3 (GOWN DISPOSABLE) ×8 IMPLANT
GOWN STRL REUS W/TWL LRG LVL3 (GOWN DISPOSABLE) ×18
HEMOSTAT SURGICEL 2X14 (HEMOSTASIS) ×1 IMPLANT
KIT BASIN OR (CUSTOM PROCEDURE TRAY) ×3 IMPLANT
KIT CLEAN ENDO COMPLIANCE (KITS) ×2 IMPLANT
KIT SUCTION CATH 14FR (SUCTIONS) ×2 IMPLANT
KIT TURNOVER KIT B (KITS) ×3 IMPLANT
MARKER SKIN DUAL TIP RULER LAB (MISCELLANEOUS) IMPLANT
NDL 18GX1X1/2 (RX/OR ONLY) (NEEDLE) IMPLANT
NEEDLE 18GX1X1/2 (RX/OR ONLY) (NEEDLE) ×3 IMPLANT
NS IRRIG 1000ML POUR BTL (IV SOLUTION) ×6 IMPLANT
OIL SILICONE PENTAX (PARTS (SERVICE/REPAIRS)) ×2 IMPLANT
PACK CHEST (CUSTOM PROCEDURE TRAY) ×3 IMPLANT
PAD ARMBOARD 7.5X6 YLW CONV (MISCELLANEOUS) ×10 IMPLANT
PASSER SUT SWANSON 36MM LOOP (INSTRUMENTS) ×1 IMPLANT
POUCH ENDO CATCH II 15MM (MISCELLANEOUS) ×1 IMPLANT
POUCH SPECIMEN RETRIEVAL 10MM (ENDOMECHANICALS) ×1 IMPLANT
RELOAD STAPLE 35X2.5 WHT THIN (STAPLE) IMPLANT
RELOAD STAPLE 45 4.1 GRN THCK (STAPLE) IMPLANT
RELOAD STAPLE 45 GOLD REG/THCK (STAPLE) IMPLANT
SCISSORS LAP 5X35 DISP (ENDOMECHANICALS) ×1 IMPLANT
SEALANT SURG COSEAL 4ML (VASCULAR PRODUCTS) IMPLANT
SEALANT SURG COSEAL 8ML (VASCULAR PRODUCTS) IMPLANT
SOLUTION ANTI FOG 6CC (MISCELLANEOUS) ×3 IMPLANT
SPONGE INTESTINAL PEANUT (DISPOSABLE) ×1 IMPLANT
SPONGE TONSIL TAPE 1 RFD (DISPOSABLE) ×1 IMPLANT
STAPLE RELOAD 2.5MM WHITE (STAPLE) ×9 IMPLANT
STAPLE RELOAD 45 GRN (STAPLE) ×2 IMPLANT
STAPLE RELOAD 45MM GOLD (STAPLE) ×18 IMPLANT
STAPLE RELOAD 45MM GREEN (STAPLE) ×3
STAPLER VASCULAR ECHELON 35 (CUTTER) ×1 IMPLANT
SUT PROLENE 3 0 SH DA (SUTURE) IMPLANT
SUT PROLENE 4 0 RB 1 (SUTURE) ×3
SUT PROLENE 4 0 SH DA (SUTURE) ×1 IMPLANT
SUT PROLENE 4-0 RB1 .5 CRCL 36 (SUTURE) IMPLANT
SUT SILK  1 MH (SUTURE) ×4
SUT SILK 1 MH (SUTURE) ×8 IMPLANT
SUT SILK 1 TIES 10X30 (SUTURE) IMPLANT
SUT SILK 2 0 SH (SUTURE) IMPLANT
SUT SILK 2 0SH CR/8 30 (SUTURE) IMPLANT
SUT SILK 3 0SH CR/8 30 (SUTURE) ×1 IMPLANT
SUT STEEL 1 (SUTURE) IMPLANT
SUT VIC AB 0 CTX 18 (SUTURE) ×2 IMPLANT
SUT VIC AB 1 CTX 18 (SUTURE) ×1 IMPLANT
SUT VIC AB 1 CTX 36 (SUTURE)
SUT VIC AB 1 CTX36XBRD ANBCTR (SUTURE) IMPLANT
SUT VIC AB 2-0 CTX 36 (SUTURE) ×1 IMPLANT
SUT VIC AB 2-0 UR6 27 (SUTURE) IMPLANT
SUT VIC AB 3-0 SH 8-18 (SUTURE) ×1 IMPLANT
SUT VIC AB 3-0 X1 27 (SUTURE) ×1 IMPLANT
SUT VICRYL 0 UR6 27IN ABS (SUTURE) IMPLANT
SUT VICRYL 2 TP 1 (SUTURE) ×1 IMPLANT
SYR 20ML ECCENTRIC (SYRINGE) ×2 IMPLANT
SYSTEM SAHARA CHEST DRAIN ATS (WOUND CARE) ×3 IMPLANT
TAPE CLOTH SURG 4X10 WHT LF (GAUZE/BANDAGES/DRESSINGS) ×1 IMPLANT
TAPE UMBILICAL COTTON 1/8X30 (MISCELLANEOUS) ×1 IMPLANT
TOWEL GREEN STERILE (TOWEL DISPOSABLE) ×3 IMPLANT
TOWEL GREEN STERILE FF (TOWEL DISPOSABLE) ×3 IMPLANT
TRAP SPECIMEN MUCOUS 40CC (MISCELLANEOUS) IMPLANT
TRAY FOLEY MTR SLVR 16FR STAT (SET/KITS/TRAYS/PACK) ×3 IMPLANT
TROCAR BLADELESS 12MM (ENDOMECHANICALS) IMPLANT
TROCAR XCEL 12X100 BLDLESS (ENDOMECHANICALS) ×1 IMPLANT
TROCAR XCEL BLADELESS 5X75MML (TROCAR) ×1 IMPLANT
TUBE CONNECTING 20X1/4 (TUBING) ×3 IMPLANT
TUNNELER SHEATH ON-Q 11GX8 DSP (PAIN MANAGEMENT) ×1 IMPLANT
VALVE DISPOSABLE (MISCELLANEOUS) ×2 IMPLANT
WATER STERILE IRR 1000ML POUR (IV SOLUTION) ×3 IMPLANT

## 2018-02-14 NOTE — H&P (Signed)
[address]       [place] [address]             [phone]                    [place] Medical Record #[id number] Date of Birth: 1950-09-19  Referring: Gwenevere Ghazi, MD Primary Care: Stephens Shire, MD Primary Cardiologist: No primary care provider on file.  Chief Complaint:    Chief Complaint  Patient presents with  . Lung Lesion right          History of Present Illness:    Shelby Green 67 y.o. female is seen in the office   for evaluation of a right lower lobe lung nodule.  The patient is states she has been a lifelong non-smoker.  She presented in June with upper respiratory complaints sinus drainage and cough.  Because of these complaints she was seen in urgent care and a chest x-ray was obtained, there was some opacity in the right lower lobe.  Subsequently the patient was referred to Dr. Glade Lloyd in [facility], CT scan of the chest and PET scan were performed.  Bronchoscopy was done that was unrevealing of any tissue diagnosis by Dr. Glade Lloyd.  This the plan was to obtain a follow-up CT scan in several months which was done last week.  The right lower lobe mass by report appears the same.  The patient will return and bring copies of the discs of her CT scans.  I have reviewed the PET scan images, and have requested pathology to amend the report as the lesion is in the right lower lobe not the right upper lobe.   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  Her husband has been a long-term smoker.  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  Currently she remains active walking up to 4 miles a day.    Current Activity/ Functional Status:  Patient is independent with mobility/ambulation, transfers, ADL's, IADL's.   Zubrod Score: At the time of surgery this patient's most appropriate activity status/level should be described as: [x]     0    Normal  activity, no symptoms []     1    Restricted in physical strenuous activity but ambulatory, able to do out light work []     2    Ambulatory and capable of self care, unable to do work activities, up and about               >50 % of waking hours                              []     3    Only limited self care, in bed greater than 50% of waking hours []     4    Completely disabled, no self care, confined to bed or chair []     5    Moribund   Past Medical History:  Diagnosis Date  . Allergy   . Back pain   . Hypertension   . Hypertension   . Lung mass   . Nodule of lower lobe of right lung   . Wears dentures   . Wears glasses     Past Surgical History:  Procedure Laterality Date  . COLONOSCOPY W/  BIOPSIES AND POLYPECTOMY    . MULTIPLE TOOTH EXTRACTIONS      Family History  Problem Relation Age of Onset  . Arthritis Mother   . Hypertension Mother   . Stroke Mother   . Hypertension Sister    Patient's family history is for father who died in his 69s in a tractor accident.  Since somebody ran into the back of patient's mother died at age 82 with issues related to stroke she had known coronary occlusive disease, one brother died at age 47 of myocardial infarction she has 1 brother insists 5 sisters who are all lung.  One aunt, -sister of her mother died at age 23 of lung cancer she was a lifelong non-smoker   Social History   Tobacco Use  Smoking Status Never Smoker  Smokeless Tobacco Never Used    Social History   Substance and Sexual Activity  Alcohol Use No     Allergies  Allergen Reactions  . Penicillin G Itching    Has patient had a PCN reaction causing immediate rash, facial/tongue/throat swelling, SOB or lightheadedness with hypotension: No Has patient had a PCN reaction causing severe rash involving mucus membranes or skin necrosis: No Has patient had a PCN reaction that required hospitalization: No Has patient had a PCN reaction occurring within the last 10  years: No If all of the above answers are "NO", then may proceed with Cephalosporin use.     Current Facility-Administered Medications  Medication Dose Route Frequency Provider Last Rate Last Dose  . fentaNYL (SUBLIMAZE) 100 MCG/2ML injection           . lactated ringers infusion   Intravenous Continuous Duane Boston, MD 10 mL/hr at 02/14/18 [phone]    . vancomycin (VANCOCIN) 1-5 GM/200ML-% IVPB           . vancomycin (VANCOCIN) IVPB 1000 mg/200 mL premix  1,000 mg Intravenous On Call to OR Grace Isaac, MD       Facility-Administered Medications Ordered in Other Encounters  Medication Dose Route Frequency Provider Last Rate Last Dose  . fentaNYL (SUBLIMAZE) injection   Intravenous Anesthesia Intra-op Glyn Ade, CRNA   50 mcg at 02/14/18 [phone]  . midazolam (VERSED) 5 MG/5ML injection    Anesthesia Intra-op Glyn Ade, CRNA   1 mg at 02/14/18 4010    Pertinent items are noted in HPI.   Review of Systems:     Cardiac Review of Systems: [Y] = yes  or   [ N ] = no   Chest Pain [ N ]  Resting SOB Aqua.Slicker ] Exertional SOB  [Y ]  Vertell Limber Aqua.Slicker ]   Pedal Edema Aqua.Slicker ]    Palpitations [ N] Syncope  [ N]   Presyncope [ N ]   General Review of Systems: [Y] = yes [  ]=no Constitional: recent weight change [  ];  Wt loss over the last 3 months [   ] anorexia [  ]; fatigue [  ]; nausea [  ]; night sweats [  ]; fever [  ]; or chills [  ];           Eye : blurred vision [  ]; diplopia [   ]; vision changes [  ];  Amaurosis fugax[  ]; Resp: cough [  ];  wheezing[  ];  hemoptysis[  ]; shortness of breath[  ]; paroxysmal nocturnal dyspnea[  ]; dyspnea on exertion[  ]; or orthopnea[  ];  GI:  gallstones[  ], vomiting[  ];  dysphagia[  ]; melena[  ];  hematochezia [  ]; heartburn[  ];   Hx of  Colonoscopy[ y ]; GU: Green stones [  ]; hematuria[  ];   dysuria [  ];  nocturia[  ];  history of     obstruction [  ]; urinary frequency [  ]             Skin: rash, swelling[  ];, hair  loss[  ];  peripheral edema[  ];  or itching[  ]; Musculosketetal: myalgias[  ];  joint swelling[  ];  joint erythema[  ];  joint pain[  ];  back pain[  ];  Heme/Lymph: bruising[  ];  bleeding[  ];  anemia[  ];  Neuro: TIA[  ];  headaches[  ];  stroke[  ];  vertigo[  ];  seizures[  ];   paresthesias[  ];  difficulty walking[  ];  Psych:depression[  ]; anxiety[  ];  Endocrine: diabetes[  ];  thyroid dysfunction[  ];  Immunizations: Flu up to date [  ]; Pneumococcal up to date [  ];  Other:    PHYSICAL EXAMINATION: BP (!) 180/98   Pulse (!) 59   Temp 98.8 F (37.1 C) (Oral)   Resp 18   Ht 5\' 4"  (1.626 m)   Wt 70.9 kg   SpO2 100%   BMI 26.83 kg/m  General appearance: alert, cooperative and no distress Head: Normocephalic, without obvious abnormality, atraumatic Neck: no adenopathy, no carotid bruit, no JVD, supple, symmetrical, trachea midline and thyroid not enlarged, symmetric, no tenderness/mass/nodules Lymph nodes: Cervical, supraclavicular, and axillary nodes normal. Resp: clear to auscultation bilaterally Cardio: regular rate and rhythm, S1, S2 normal, no murmur, click, rub or gallop GI: soft, non-tender; bowel sounds normal; no masses,  no organomegaly Extremities: extremities normal, atraumatic, no cyanosis or edema and Homans sign is negative, no sign of DVT Neurologic: Grossly normal  Diagnostic Studies & Laboratory data:     Recent Radiology Findings:   CLINICAL DATA: Initial treatment strategy for pulmonary nodule.  Final Report  ADDENDUM REPORT: 02/05/2018 15:37  ADDENDUM: Correction :  IMPRESSION:  Hypermetabolic RIGHT LOWER lobe nodule.  EXAM: NUCLEAR MEDICINE PET SKULL BASE TO THIGH  TECHNIQUE: Fifteen mCi F-18 FDG was injected intravenously. Full-ring PET imaging was performed from the skull base to thigh after the radiotracer. CT data was obtained and used for attenuation correction and anatomic localization.  Fasting blood glucose: 94  mg/dl  COMPARISON: None available  FINDINGS: Mediastinal blood pool activity: SUV max 1. Is 29  NECK: No hypermetabolic lymph nodes in the neck.  Incidental CT findings: none  CHEST: Within the RIGHT lower lobe 18 mm nodule above the RIGHT hemidiaphragm has associated metabolic activity with SUV max equal 4.8 no additional pulmonary nodules. No hypermetabolic mediastinal lymph nodes.  Incidental CT findings: None  ABDOMEN/PELVIS: No abnormal hypermetabolic activity within the liver, pancreas, adrenal glands, or spleen. No hypermetabolic lymph nodes in the abdomen or pelvis. No adnexal abnormality.  Incidental CT findings: none  SKELETON: No focal hypermetabolic activity to suggest skeletal metastasis.  Incidental CT findings: none  IMPRESSION: 1. Hypermetabolic RIGHT upper lobe pulmonary nodule is concerning for bronchogenic carcinoma. If no smoking history, consider a focus of pulmonary infection. As lesion would be difficult for percutaneous biopsy, consider short-term CT follow-up (6 weeks). If more immediate tissue sampling is warranted, consider bronchoscopy. 2. No hypermetabolic mediastinal nodes.   Electronically Signed  By: Suzy Bouchard M.D. On: 11/17/2017 15:56            I have independently reviewed the above radiology studies  and reviewed the findings with the patient.   Recent Lab Findings: Lab Results  Component Value Date   WBC 4.2 02/13/2018   HGB 13.1 02/13/2018   HCT 41.7 02/13/2018   PLT 221 02/13/2018   GLUCOSE 98 02/13/2018   ALT 17 02/13/2018   AST 19 02/13/2018   NA 142 02/13/2018   K 3.9 02/13/2018   CL 110 02/13/2018   CREATININE 0.84 02/13/2018   BUN 11 02/13/2018   CO2 25 02/13/2018   TSH 1.532 08/01/2012   INR 1.00 02/13/2018   PFT's  FEV1 1.72 88% DLCO 22.07  90% Interpretation: The FVC, FEV1, FEV1/FVC ratio and FEF25-75% are within normal limits, but there is curvature to the flow volume loop suggesting  minimal small airway disease. Patient effort was erratic, but the normal FEV1 indicates no significant obstruction. The airway resistance is normal. Following administration of bronchodilators, there is no significant response. The diffusing capacity is normal. However, the diffusing capacity was not corrected for the patient's hemoglobin. Conclusions: Minimal airway obstruction is present. In addition, lung volumes are reduced indicating a concurrent restrictive process. 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 -She is been referred for consideration of surgical resection.  I discussed with her the pros and cons of attempting a biopsy versus a surgical resection and biopsy simultaneously.  The CT scan disc she brought from [facility] were reviewed today.  Her PFTs are adequate for resection.  I recommended to her that we proceed with bronchoscopy right video-assisted thoracoscopy with lung resection, both for diagnostic reasons and therapeutic reasons if the mass is malignant. Risks and options of surgery have been discussed with her and detail and she is willing to proceed.    The goals risks and alternatives of the planned surgical procedure Procedure(s): VIDEO BRONCHOSCOPY (N/A) VIDEO ASSISTED THORACOSCOPY (VATS)/LUNG RESECTION (Right)  have been discussed with the patient in detail. The risks of the procedure including death, infection, stroke, myocardial infarction, bleeding, blood transfusion, persisyent air leak have all been discussed specifically.  I have quoted [place] a 1 % of perioperative mortality and a complication rate as high as 20 %. The patient's questions have been answered.Shelby Green is willing  to proceed with the  planned procedure.   Grace Isaac MD      [address] [place] [address] Office [phone]   Beeper [phone]  02/14/2018 11:45 AM

## 2018-02-14 NOTE — Anesthesia Procedure Notes (Signed)
Central Venous Catheter Insertion Performed by: Duane Boston, MD, anesthesiologist Start/End9/25/2019 9:29 AM, 02/14/2018 9:39 AM Patient location: Pre-op. Preanesthetic checklist: patient identified, IV checked, site marked, risks and benefits discussed, surgical consent, monitors and equipment checked, pre-op evaluation, timeout performed and anesthesia consent Position: Trendelenburg Lidocaine 1% used for infiltration and patient sedated Hand hygiene performed , maximum sterile barriers used  and Seldinger technique used Catheter size: 8 Fr Total catheter length 16. Central line was placed.Double lumen Procedure performed without using ultrasound guided technique. Attempts: 1 Following insertion, dressing applied, line sutured and Biopatch. Post procedure assessment: blood return through all ports, free fluid flow and no air  Patient tolerated the procedure well with no immediate complications.

## 2018-02-14 NOTE — Transfer of Care (Signed)
Immediate Anesthesia Transfer of Care Note  Patient: Shelby Green  Procedure(s) Performed: VIDEO BRONCHOSCOPY (N/A ) RIGHT  VIDEO ASSISTED THORACOSCOPY (VATS) WITH RIGHT LOWER LOBE (RLL) WEDGE RESECTION, COMPLETION RLL LOBECTOMY, LYMPH NODE DISSECTION, ON-Q PLACEMENT (Right Chest)  Patient Location: PACU  Anesthesia Type:General  Level of Consciousness: awake, alert , oriented and patient cooperative  Airway & Oxygen Therapy: Patient Spontanous Breathing and Patient connected to nasal cannula oxygen  Post-op Assessment: Report given to RN, Post -op Vital signs reviewed and stable and Patient moving all extremities  Post vital signs: Reviewed and stable  Last Vitals:  Vitals Value Taken Time  BP 193/129 02/14/2018  4:08 PM  Temp    Pulse 51 02/14/2018  4:12 PM  Resp 17 02/14/2018  4:12 PM  SpO2 100 % 02/14/2018  4:12 PM  Vitals shown include unvalidated device data.  Last Pain:  Vitals:   02/14/18 0831  TempSrc:   PainSc: 5       Patients Stated Pain Goal: 0 (14/78/29 5621)  Complications: No apparent anesthesia complications

## 2018-02-14 NOTE — Anesthesia Procedure Notes (Signed)
Arterial Line Insertion Start/End9/25/2019 9:40 AM Performed by: White, Amedeo Plenty, Immunologist, CRNA  Lidocaine 1% used for infiltration and patient sedated Right, radial was placed Catheter size: 20 G Hand hygiene performed  and maximum sterile barriers used  Allen's test indicative of satisfactory collateral circulation Attempts: 2 Procedure performed without using ultrasound guided technique. Following insertion, dressing applied and Biopatch. Post procedure assessment: normal  Patient tolerated the procedure well with no immediate complications.

## 2018-02-14 NOTE — Brief Op Note (Addendum)
      [address]       [place] [address]             [phone]      02/14/2018  3:53 PM  PATIENT:  Shelby Green  67 y.o. female  PRE-OPERATIVE DIAGNOSIS:  RLL LUNG NODULE  POST-OPERATIVE DIAGNOSIS:  RLL LUNG NODULE/ non small cell cancer of the lung   PROCEDURE:  Procedure(s): VIDEO BRONCHOSCOPY (N/A) RIGHT  VIDEO ASSISTED THORACOSCOPY (VATS) WITH RIGHT LOWER LOBE (RLL) WEDGE RESECTION, COMPLETION RLL LOBECTOMY (Right) Node dissection, on q  SURGEON:  Surgeon(s) and Role:    * Grace Isaac, MD - Primary  PHYSICIAN ASSISTANT:   Nicholes Rough, PA-C   ANESTHESIA:   general  EBL:  100  BLOOD ADMINISTERED:none  DRAINS: ROUTINE   LOCAL MEDICATIONS USED:  BUPIVICAINE   SPECIMEN:  Source of Specimen:  RIGHT LOWER LOBE  DISPOSITION OF SPECIMEN:  PATHOLOGY  COUNTS:  YES  DICTATION: .Dragon Dictation  PLAN OF CARE: Admit to inpatient   PATIENT DISPOSITION:  PACU - hemodynamically stable.   Delay start of Pharmacological VTE agent (>24hrs) due to surgical blood loss or risk of bleeding: no

## 2018-02-14 NOTE — Anesthesia Preprocedure Evaluation (Addendum)
Anesthesia Evaluation    Reviewed: Allergy & Precautions, Patient's Chart, lab work & pertinent test results  History of Anesthesia Complications Negative for: history of anesthetic complications  Airway Mallampati: II  TM Distance: >3 FB Neck ROM: Full    Dental  (+) Edentulous Upper, Partial Lower, Dental Advisory Given   Pulmonary    Pulmonary exam normal        Cardiovascular hypertension, Normal cardiovascular exam     Neuro/Psych negative neurological ROS  negative psych ROS   GI/Hepatic negative GI ROS, Neg liver ROS,   Endo/Other  negative endocrine ROS  Renal/GU negative Renal ROS  negative genitourinary   Musculoskeletal negative musculoskeletal ROS (+)   Abdominal   Peds negative pediatric ROS (+)  Hematology negative hematology ROS (+)   Anesthesia Other Findings   Reproductive/Obstetrics negative OB ROS                            Anesthesia Physical Anesthesia Plan  ASA: III  Anesthesia Plan: General   Post-op Pain Management:    Induction: Intravenous  PONV Risk Score and Plan: 3 and Ondansetron, Dexamethasone and Diphenhydramine  Airway Management Planned: Oral ETT  Additional Equipment:   Intra-op Plan:   Post-operative Plan: Extubation in OR  Informed Consent: I have reviewed the patients History and Physical, chart, labs and discussed the procedure including the risks, benefits and alternatives for the proposed anesthesia with the patient or authorized representative who has indicated his/her understanding and acceptance.   Dental advisory given  Plan Discussed with: CRNA and Anesthesiologist  Anesthesia Plan Comments:        Anesthesia Quick Evaluation

## 2018-02-14 NOTE — Anesthesia Procedure Notes (Signed)
Procedure Name: Intubation Date/Time: 02/14/2018 12:14 PM Performed by: Renato Shin, CRNA Pre-anesthesia Checklist: Patient identified, Emergency Drugs available, Suction available and Patient being monitored Patient Re-evaluated:Patient Re-evaluated prior to induction Oxygen Delivery Method: Circle system utilized Preoxygenation: Pre-oxygenation with 100% oxygen Induction Type: IV induction Ventilation: Mask ventilation without difficulty Laryngoscope Size: Miller and 2 Grade View: Grade I Endobronchial tube: Double lumen EBT, EBT position confirmed by fiberoptic bronchoscope, Left and EBT position confirmed by auscultation and 37 Fr Number of attempts: 1 Airway Equipment and Method: Rigid stylet Placement Confirmation: ETT inserted through vocal cords under direct vision,  positive ETCO2,  CO2 detector and breath sounds checked- equal and bilateral Tube secured with: Tape Dental Injury: Teeth and Oropharynx as per pre-operative assessment

## 2018-02-15 ENCOUNTER — Encounter (HOSPITAL_COMMUNITY): Payer: Self-pay | Admitting: Cardiothoracic Surgery

## 2018-02-15 ENCOUNTER — Inpatient Hospital Stay (HOSPITAL_COMMUNITY): Payer: Medicare PPO

## 2018-02-15 LAB — GLUCOSE, CAPILLARY
Glucose-Capillary: 100 mg/dL — ABNORMAL HIGH (ref 70–99)
Glucose-Capillary: 108 mg/dL — ABNORMAL HIGH (ref 70–99)
Glucose-Capillary: 108 mg/dL — ABNORMAL HIGH (ref 70–99)
Glucose-Capillary: 115 mg/dL — ABNORMAL HIGH (ref 70–99)
Glucose-Capillary: 136 mg/dL — ABNORMAL HIGH (ref 70–99)
Glucose-Capillary: 141 mg/dL — ABNORMAL HIGH (ref 70–99)
Glucose-Capillary: 224 mg/dL — ABNORMAL HIGH (ref 70–99)

## 2018-02-15 LAB — BLOOD GAS, ARTERIAL
Acid-Base Excess: 1.3 mmol/L (ref 0.0–2.0)
Bicarbonate: 25.8 mmol/L (ref 20.0–28.0)
Drawn by: 311011
O2 Content: 2 L/min
O2 Saturation: 96.9 %
Patient temperature: 98.9
pCO2 arterial: 45 mmHg (ref 32.0–48.0)
pH, Arterial: 7.378 (ref 7.350–7.450)
pO2, Arterial: 91.2 mmHg (ref 83.0–108.0)

## 2018-02-15 LAB — BASIC METABOLIC PANEL
Anion gap: 9 (ref 5–15)
BUN: 9 mg/dL (ref 8–23)
CO2: 24 mmol/L (ref 22–32)
Calcium: 8.6 mg/dL — ABNORMAL LOW (ref 8.9–10.3)
Chloride: 101 mmol/L (ref 98–111)
Creatinine, Ser: 0.81 mg/dL (ref 0.44–1.00)
GFR calc Af Amer: >60 mL/min (ref >60)
GFR calc non Af Amer: >60 mL/min (ref >60)
Glucose, Bld: 146 mg/dL — ABNORMAL HIGH (ref 70–99)
Potassium: 3.8 mmol/L (ref 3.5–5.1)
Sodium: 134 mmol/L — ABNORMAL LOW (ref 135–145)

## 2018-02-15 LAB — CBC
HCT: 39.5 % (ref 36.0–46.0)
Hemoglobin: 12.9 g/dL (ref 12.0–15.0)
MCH: 29.8 pg (ref 26.0–34.0)
MCHC: 32.7 g/dL (ref 30.0–36.0)
MCV: 91.2 fL (ref 78.0–100.0)
Platelets: 233 10*3/uL (ref 150–400)
RBC: 4.33 MIL/uL (ref 3.87–5.11)
RDW: 13.1 % (ref 11.5–15.5)
WBC: 11.6 10*3/uL — ABNORMAL HIGH (ref 4.0–10.5)

## 2018-02-15 MED ORDER — INFLUENZA VAC SPLIT HIGH-DOSE 0.5 ML IM SUSY
0.5000 mL | PREFILLED_SYRINGE | INTRAMUSCULAR | Status: AC
  Administered 2018-02-17: 0.5 mL via INTRAMUSCULAR
  Filled 2018-02-15: qty 0.5

## 2018-02-15 MED ORDER — LISINOPRIL 10 MG PO TABS
10.0000 mg | ORAL_TABLET | Freq: Every day | ORAL | Status: DC
  Administered 2018-02-15 – 2018-02-18 (×4): 10 mg via ORAL
  Filled 2018-02-15 (×4): qty 1

## 2018-02-15 NOTE — Anesthesia Postprocedure Evaluation (Signed)
Anesthesia Post Note  Patient: Shelby Green  Procedure(s) Performed: VIDEO BRONCHOSCOPY (N/A ) RIGHT  VIDEO ASSISTED THORACOSCOPY (VATS) WITH RIGHT LOWER LOBE (RLL) WEDGE RESECTION, COMPLETION RLL LOBECTOMY, LYMPH NODE DISSECTION, ON-Q PLACEMENT (Right Chest)     Patient location during evaluation: PACU Anesthesia Type: General Level of consciousness: sedated Pain management: pain level controlled Vital Signs Assessment: post-procedure vital signs reviewed and stable Respiratory status: spontaneous breathing and respiratory function stable Cardiovascular status: stable Postop Assessment: no apparent nausea or vomiting Anesthetic complications: no                 Mina Carlisi DANIEL

## 2018-02-15 NOTE — Progress Notes (Addendum)
      [address]       [place] [address]             [phone]      1 Day Post-Op Procedure(s) (LRB): VIDEO BRONCHOSCOPY (N/A) RIGHT  VIDEO ASSISTED THORACOSCOPY (VATS) WITH RIGHT LOWER LOBE (RLL) WEDGE RESECTION, COMPLETION RLL LOBECTOMY, LYMPH NODE DISSECTION, ON-Q PLACEMENT (Right) Subjective: She is feeling gassy this morning. She is having some incisional pain.  Objective: Vital signs in last 24 hours: Temp:  [97.2 F (36.2 C)-98.9 F (37.2 C)] 98.9 F (37.2 C) (09/25 2335) Pulse Rate:  [51-79] 70 (09/25 2335) Cardiac Rhythm: Sinus bradycardia (09/26 0433) Resp:  [13-26] 19 (09/26 0433) BP: (135-193)/(70-136) 170/94 (09/25 1925) SpO2:  [95 %-100 %] 95 % (09/26 0433) Arterial Line BP: (87-262)/(75-108) 106/89 (09/25 1845) Weight:  [70.9 kg-72.6 kg] 72.6 kg (09/25 1925)     Intake/Output from previous day: 09/25 0701 - 09/26 0700 In: 2934 [I.V.:2934] Out: 3090 [Urine:2600; Blood:200; Chest Tube:290] Intake/Output this shift: No intake/output data recorded.  General appearance: alert, cooperative and no distress Heart: regular rate and rhythm, S1, S2 normal, no murmur, click, rub or gallop Lungs: clear to auscultation bilaterally Abdomen: soft, non-tender; bowel sounds normal; no masses,  no organomegaly Extremities: extremities normal, atraumatic, no cyanosis or edema Wound: clean and dry dressed with a sterile dressing  Lab Results: Recent Labs    02/13/18 0851 02/15/18 0538  WBC 4.2 11.6*  HGB 13.1 12.9  HCT 41.7 39.5  PLT 221 233   BMET:  Recent Labs    02/13/18 0851 02/15/18 0538  NA 142 134*  K 3.9 3.8  CL 110 101  CO2 25 24  GLUCOSE 98 146*  BUN 11 9  CREATININE 0.84 0.81  CALCIUM 9.0 8.6*    PT/INR:  Recent Labs    02/13/18 0851  LABPROT 13.1  INR 1.00   ABG    Component Value Date/Time   PHART 7.378 02/15/2018 0325   HCO3 25.8 02/15/2018 0325   O2SAT 96.9 02/15/2018 0325   CBG (last 3)  Recent Labs   02/14/18 2047 02/15/18 0000 02/15/18 0410  GLUCAP 119* 141* 136*    Assessment/Plan: S/P Procedure(s) (LRB): VIDEO BRONCHOSCOPY (N/A) RIGHT  VIDEO ASSISTED THORACOSCOPY (VATS) WITH RIGHT LOWER LOBE (RLL) WEDGE RESECTION, COMPLETION RLL LOBECTOMY, LYMPH NODE DISSECTION, ON-Q PLACEMENT (Right)  1. CV-NSR to SB. BP elevated and not on any home medications. Will avoid BB since bradycardia. Will add ACEI.  2. Pulm-tolerating 2L [place] for oxygen support. Continue to encourage incentive spirometer. Chest tubes put of 290cc/since surgery-keep. 3. Renal-creatinine 0.81, electrolytes okay 4. H and H stable, expected acute blood loss anemia 5. Endo-blood glucose well controlled 6. Continue Lovenox for DVT prophylaxis  Plan: BP remains high. Will start on an ACEI-not on any medication at home but has had issues with HTN in the past. Keep pain well controlled on PCA and oral medication.     LOS: 1 day    Elgie Collard 02/15/2018  No air leak, ct to water seal  D/c foley , a line  I have seen and examined Shelby Green and agree with the above assessment  and plan.  Grace Isaac MD Beeper [phone] Office [phone] 02/15/2018 8:28 AM

## 2018-02-15 NOTE — Progress Notes (Signed)
Patient ID: Shelby Green, female   DOB: 04-09-1951, 67 y.o.   MRN: [id number] EVENING ROUNDS NOTE :     [address]       [place] [address],[place] [address]             [phone]                 1 Day Post-Op Procedure(s) (LRB): VIDEO BRONCHOSCOPY (N/A) RIGHT  VIDEO ASSISTED THORACOSCOPY (VATS) WITH RIGHT LOWER LOBE (RLL) WEDGE RESECTION, COMPLETION RLL LOBECTOMY, LYMPH NODE DISSECTION, ON-Q PLACEMENT (Right)  Total Length of Stay:  LOS: 1 day  BP 133/83 (BP Location: Left Arm)   Pulse 62   Temp 98.6 F (37 C) (Oral)   Resp 18   Ht 5\' 4"  (1.626 m)   Wt 72.6 kg   SpO2 99%   BMI 27.47 kg/m   .Intake/Output      09/25 0701 - 09/26 0700 09/26 0701 - 09/27 0700   P.O.  480   I.V. (mL/kg) 2934 (40.4) 390.2 (5.4)   IV Piggyback 0 0   Total Intake(mL/kg) 2934 (40.4) 870.2 (12)   Urine (mL/kg/hr) 2600 1200 (1.4)   Blood 200    Chest Tube 290 110   Total Output 3090 1310   Net -156 -439.8          . bupivacaine 0.5 % ON-Q pump SINGLE CATH 400 mL    . dextrose 5 % and 0.45% NaCl 10 mL/hr at 02/15/18 0840  . lactated ringers Stopped (02/15/18 0700)  . potassium chloride       Lab Results  Component Value Date   WBC 11.6 (H) 02/15/2018   HGB 12.9 02/15/2018   HCT 39.5 02/15/2018   PLT 233 02/15/2018   GLUCOSE 146 (H) 02/15/2018   ALT 17 02/13/2018   AST 19 02/13/2018   NA 134 (L) 02/15/2018   K 3.8 02/15/2018   CL 101 02/15/2018   CREATININE 0.81 02/15/2018   BUN 9 02/15/2018   CO2 24 02/15/2018   TSH 1.532 08/01/2012   INR 1.00 02/13/2018   Stable day No air leak    Grace Isaac MD  Beeper [phone] Office [phone] 02/15/2018 6:49 PM

## 2018-02-16 ENCOUNTER — Inpatient Hospital Stay (HOSPITAL_COMMUNITY): Payer: Medicare PPO

## 2018-02-16 LAB — COMPREHENSIVE METABOLIC PANEL
ALT: 22 U/L (ref 0–44)
AST: 24 U/L (ref 15–41)
Albumin: 3.6 g/dL (ref 3.5–5.0)
Alkaline Phosphatase: 61 U/L (ref 38–126)
Anion gap: 7 (ref 5–15)
BUN: 17 mg/dL (ref 8–23)
CO2: 28 mmol/L (ref 22–32)
Calcium: 8.8 mg/dL — ABNORMAL LOW (ref 8.9–10.3)
Chloride: 104 mmol/L (ref 98–111)
Creatinine, Ser: 0.94 mg/dL (ref 0.44–1.00)
GFR calc Af Amer: >60 mL/min (ref >60)
GFR calc non Af Amer: >60 mL/min (ref >60)
Glucose, Bld: 103 mg/dL — ABNORMAL HIGH (ref 70–99)
Potassium: 4 mmol/L (ref 3.5–5.1)
Sodium: 139 mmol/L (ref 135–145)
Total Bilirubin: 1 mg/dL (ref 0.3–1.2)
Total Protein: 6 g/dL — ABNORMAL LOW (ref 6.5–8.1)

## 2018-02-16 LAB — CBC
HCT: 41.3 % (ref 36.0–46.0)
Hemoglobin: 13.1 g/dL (ref 12.0–15.0)
MCH: 29.2 pg (ref 26.0–34.0)
MCHC: 31.7 g/dL (ref 30.0–36.0)
MCV: 92.2 fL (ref 78.0–100.0)
Platelets: 226 10*3/uL (ref 150–400)
RBC: 4.48 MIL/uL (ref 3.87–5.11)
RDW: 13.5 % (ref 11.5–15.5)
WBC: 12.5 10*3/uL — ABNORMAL HIGH (ref 4.0–10.5)

## 2018-02-16 LAB — GLUCOSE, CAPILLARY
Glucose-Capillary: 95 mg/dL (ref 70–99)
Glucose-Capillary: 88 mg/dL (ref 70–99)
Glucose-Capillary: 102 mg/dL — ABNORMAL HIGH (ref 70–99)
Glucose-Capillary: 94 mg/dL (ref 70–99)
Glucose-Capillary: 108 mg/dL — ABNORMAL HIGH (ref 70–99)

## 2018-02-16 MED ORDER — LACTULOSE 10 GM/15ML PO SOLN
20.0000 g | Freq: Every day | ORAL | Status: DC | PRN
  Filled 2018-02-16: qty 30

## 2018-02-16 NOTE — Progress Notes (Signed)
Anterior chest tube removed, occlusive dressing applied over site. Pt tolerated well, no complains, VS stable, o2 stats 98%. Will continue to monitor pt.

## 2018-02-16 NOTE — Progress Notes (Addendum)
      [address]       [place] [address]             [phone]      2 Days Post-Op Procedure(s) (LRB): VIDEO BRONCHOSCOPY (N/A) RIGHT  VIDEO ASSISTED THORACOSCOPY (VATS) WITH RIGHT LOWER LOBE (RLL) WEDGE RESECTION, COMPLETION RLL LOBECTOMY, LYMPH NODE DISSECTION, ON-Q PLACEMENT (Right) Subjective: No issues overnight. She had a good day yesterday. She is eating well.   Objective: Vital signs in last 24 hours: Temp:  [98.2 F (36.8 C)-98.8 F (37.1 C)] 98.2 F (36.8 C) (09/27 0337) Pulse Rate:  [56-80] 62 (09/27 0337) Cardiac Rhythm: Normal sinus rhythm (09/27 0700) Resp:  [14-27] 18 (09/27 0337) BP: (114-159)/(72-103) 159/96 (09/27 0337) SpO2:  [95 %-100 %] 95 % (09/27 0337)    Intake/Output from previous day: 09/26 0701 - 09/27 0700 In: 975.1 [P.O.:480; I.V.:495.1] Out: 1950 [Urine:1750; Chest Tube:200] Intake/Output this shift: No intake/output data recorded.  General appearance: alert, cooperative and no distress Heart: regular rate and rhythm, S1, S2 normal, no murmur, click, rub or gallop Lungs: clear to auscultation bilaterally Abdomen: soft, non-tender; bowel sounds normal; no masses,  no organomegaly Extremities: extremities normal, atraumatic, no cyanosis or edema Wound: clean and dry  Lab Results: Recent Labs    02/15/18 0538 02/16/18 0335  WBC 11.6* 12.5*  HGB 12.9 13.1  HCT 39.5 41.3  PLT 233 226   BMET:  Recent Labs    02/15/18 0538 02/16/18 0335  NA 134* 139  K 3.8 4.0  CL 101 104  CO2 24 28  GLUCOSE 146* 103*  BUN 9 17  CREATININE 0.81 0.94  CALCIUM 8.6* 8.8*    PT/INR:  Recent Labs    02/13/18 0851  LABPROT 13.1  INR 1.00   ABG    Component Value Date/Time   PHART 7.378 02/15/2018 0325   HCO3 25.8 02/15/2018 0325   O2SAT 96.9 02/15/2018 0325   CBG (last 3)  Recent Labs    02/15/18 2024 02/15/18 2340 02/16/18 0332  GLUCAP 115* 108* 95    Assessment/Plan: S/P Procedure(s) (LRB): VIDEO  BRONCHOSCOPY (N/A) RIGHT  VIDEO ASSISTED THORACOSCOPY (VATS) WITH RIGHT LOWER LOBE (RLL) WEDGE RESECTION, COMPLETION RLL LOBECTOMY, LYMPH NODE DISSECTION, ON-Q PLACEMENT (Right)  1. CV-NSR to SB. BP slightly improved. Will avoid BB since bradycardia. Continue ACEI.  2. Pulm-tolerating room air with good oxygenation. Continue to encourage incentive spirometer. Chest tubes put of 200cc/24 hours. CT on water seal. CXR is stable. 3. Renal-creatinine 0.81, electrolytes okay 4. H and H stable, expected acute blood loss anemia 5. Endo-blood glucose well controlled 6. Continue Lovenox for DVT prophylaxis  Plan: one chest tube can likely be removed today. CXR stable and no air leak. Work on pain control and walking in the halls. Encouraged incentive spirometer.    LOS: 2 days    Shelby Green 02/16/2018  path still pending D/c anterior chest tube  I have seen and examined Shelby Green and agree with the above assessment  and plan.  Grace Isaac MD Beeper [phone] Office [phone] 02/16/2018 11:19 AM

## 2018-02-16 NOTE — Discharge Summary (Signed)
Physician Discharge Summary  Patient ID: Shelby Green MRN: [id number] DOB/AGE: Oct 19, 1950 67 y.o.  Admit date: 02/14/2018 Discharge date: 02/18/2018  Admission Diagnoses: Patient Active Problem List   Diagnosis Date Noted  . Benign essential hypertension 03/28/2016    Discharge Diagnoses:  Active Problems:   S/P lobectomy of lung   Discharged Condition: good  HPI:  Shelby Green 67 y.o. female is seen in the office   for evaluation of a right lower lobe lung nodule.  The patient is states she has been a lifelong non-smoker.  She presented in June with upper respiratory complaints sinus drainage and cough.  Because of these complaints she was seen in urgent care and a chest x-ray was obtained, there was some opacity in the right lower lobe.  Subsequently the patient was referred to Dr. Glade Lloyd in [facility], CT scan of the chest and PET scan were performed.  Bronchoscopy was done that was unrevealing of any tissue diagnosis by Dr. Glade Lloyd.  This the plan was to obtain a follow-up CT scan in several months which was done last week.  The right lower lobe mass by report appears the same.  The patient will return and bring copies of the discs of her CT scans.  I have reviewed the PET scan images, and have requested pathology to amend the report as the lesion is in the right lower lobe not the right upper lobe.   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  Her husband has been a long-term smoker.  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  Currently she remains active walking up to 4 miles a day.  Hospital Course:   [name] underwent a video bronchoscopy, right video-assisted thoracoscopy, wedge resection, and complete right lower lobe lobectomy on 02/14/2018 with Dr. Servando Snare.  She tolerated the procedure well and was transferred to the PACU for continued care.  Postop day 1 her blood pressure was  slightly elevated therefore we added an ACE inhibitor.  She was not on any medications at home.  She is tolerating 2 L nasal cannula with excellent oxygenation.  We changed her chest tubes to waterseal but kept them in place due to output.  Her renal function remains stable.  We initiated Lovenox for DVT prophylaxis.  We discontinued her Foley catheter and arterial line.  Postop day 2 she continued to progress.  Her chest output decreased and she did not have a air leak on waterseal, therefore we removed her anterior chest tube. We continued to work on pain control. POD 3 we removed her posterior chest tube. She continued to progress. We discontinued her PCA pump and her pain was well controlled on oral medication. POD 4 she was ambulating with limited assistance, her incisions were healing well, and she was tolerating room air. She is ready for discharge home.   Consults: None  Significant Diagnostic Studies:   CLINICAL DATA:  Follow-up chest tube  EXAM: PORTABLE CHEST 1 VIEW  COMPARISON:  02/15/2018  FINDINGS: Cardiac shadow is mildly enlarged but stable. Right subclavian central line is noted in satisfactory position. Two right chest tubes are seen. No pneumothorax is identified. Postsurgical changes are noted on the right inferiorly. Left lung shows some minimal basilar atelectasis. No bony abnormality is noted.  IMPRESSION: No pneumothorax.  New minimal left basilar atelectasis.   Electronically Signed   By: Linus Mako.D.  On: 02/16/2018 08:17  Treatments:  02/14/2018  3:53 PM  PATIENT:  Shelby Green  67 y.o. female  PRE-OPERATIVE DIAGNOSIS:  RLL LUNG NODULE  POST-OPERATIVE DIAGNOSIS:  RLL LUNG NODULE/ non small cell cancer of the lung   PROCEDURE:  Procedure(s): VIDEO BRONCHOSCOPY (N/A) RIGHT  VIDEO ASSISTED THORACOSCOPY (VATS) WITH RIGHT LOWER LOBE (RLL) WEDGE RESECTION, COMPLETION RLL LOBECTOMY (Right) Node dissection, on q  SURGEON:   Surgeon(s) and Role:    * Grace Isaac, MD - Primary  PHYSICIAN ASSISTANT:   Nicholes Rough, PA-C   ANESTHESIA:   general  EBL:  100  BLOOD ADMINISTERED:none  DRAINS: ROUTINE   LOCAL MEDICATIONS USED:  BUPIVICAINE   SPECIMEN:  Source of Specimen:  RIGHT LOWER LOBE  DISPOSITION OF SPECIMEN:  PATHOLOGY  COUNTS:  YES  DICTATION: .Dragon Dictation  PLAN OF CARE: Admit to inpatient   PATIENT DISPOSITION:  PACU - hemodynamically stable.   Delay start of Pharmacological VTE agent (>24hrs) due to surgical blood loss or risk of bleeding: no   Discharge Exam: Blood pressure 119/76, pulse (!) 57, temperature 98.2 F (36.8 C), temperature source Oral, resp. rate (!) 21, height 5\' 4"  (1.626 m), weight 72.6 kg, SpO2 98 %.   General appearance: alert, cooperative and no distress Neurologic: intact Heart: regular rate and rhythm Lungs: diminished breath sounds right base Wound clean and dry  Disposition:   Discharge Instructions    Discharge patient   Complete by:  As directed    Discharge disposition:  01-Home or Self Care   Discharge patient date:  02/18/2018     Allergies as of 02/18/2018      Reactions   Penicillin G Itching   Has patient had a PCN reaction causing immediate rash, facial/tongue/throat swelling, SOB or lightheadedness with hypotension: No Has patient had a PCN reaction causing severe rash involving mucus membranes or skin necrosis: No Has patient had a PCN reaction that required hospitalization: No Has patient had a PCN reaction occurring within the last 10 years: No If all of the above answers are "NO", then may proceed with Cephalosporin use.      Medication List    TAKE these medications   acetaminophen 500 MG tablet Commonly known as:  TYLENOL Take 2 tablets (1,000 mg total) by mouth every 6 (six) hours.   lisinopril 10 MG tablet Commonly known as:  PRINIVIL,ZESTRIL Take 1 tablet (10 mg total) by mouth daily. Start taking  on:  02/19/2018   oxyCODONE 5 MG immediate release tablet Commonly known as:  Oxy IR/ROXICODONE Take 1 tablet (5 mg total) by mouth every 6 (six) hours as needed for severe pain.   Travoprost (BAK Free) 0.004 % Soln ophthalmic solution Commonly known as:  TRAVATAN Place 1 drop into both eyes at bedtime.      Follow-up Information    Gwenevere Ghazi, MD. Call in 1 day(s).   Specialty:  Pulmonary Disease Contact information: [facility] [place] [address] [phone]        Grace Isaac, MD Follow up.   Specialty:  Cardiothoracic Surgery Why:  Your routine follow-up appointment is on March 06, 2018 at 1 PM.  Please arrive at 12:30 PM for a chest x-ray located at [facility] which is on the first floor of our building. Contact information: [address] [place] [address] [phone]        Stephens Shire, MD. Call in 1 day(s).  Specialty:  Family Medicine Contact information: [place] [address] [phone]        nursing appointment Follow up.   Why:  Please arrive at 10:30am on Monday Oct 7th for a suture removal appointment.  Contact information: Dr. Everrett Coombe office          Signed: Elgie Collard 02/18/2018, 10:34 AM

## 2018-02-16 NOTE — Discharge Instructions (Signed)
Video-Assisted Thoracic Surgery, Care After ° °This sheet gives you information about how to care for yourself after your procedure. Your health care provider may also give you more specific instructions. If you have problems or questions, contact your health care provider. °What can I expect after the procedure? °After the procedure, it is common to have: °· Some pain and soreness in your chest. °· Pain when breathing in (inhaling) and coughing. °· Constipation. °· Fatigue. °· Difficulty sleeping. ° °Follow these instructions at home: °Preventing pneumonia °· Take deep breaths or do breathing exercises as instructed by your health care provider. Doing this helps prevent lung infection (pneumonia). °· Cough frequently. Coughing may cause discomfort, but it is important to clear mucus (phlegm) and expand your lungs. If it hurts to cough, hold a pillow against your chest or place the palms of both hands on top of the incision (use splinting) when you cough. This may help relieve discomfort. °· If you were given an incentive spirometer, use it as directed. An incentive spirometer is a tool that measures how well you are filling your lungs with each breath. °· Participate in pulmonary rehabilitation as directed by your health care provider. This is a program that combines education, exercise, and support from a team of specialists. The goal is to help you heal and get back to your normal activities as soon as possible. °Medicines °· Take over-the-counter or prescription medicines only as told by your health care provider. °· If you have pain, take pain-relieving medicine before your pain becomes severe. This is important because if your pain is under control, you will be able to breathe and cough more comfortably. °· If you were prescribed an antibiotic medicine, take it as told by your health care provider. Do not stop taking the antibiotic even if you start to feel better. °Activity °· Ask your health care provider  what activities are safe for you. °· Avoid activities that use your chest muscles for at least 3-4 weeks. °· Do not lift anything that is heavier than 10 lb (4.5 kg), or the limit that your health care provider tells you, until he or she says that it is safe. °Incision care °· Follow instructions from your health care provider about how to take care of your incision(s). Make sure you: °? Wash your hands with soap and water before you change your bandage (dressing). If soap and water are not available, use hand sanitizer. °? Change your dressing as told by your health care provider. °? Leave stitches (sutures), skin glue, or adhesive strips in place. These skin closures may need to stay in place for 2 weeks or longer. If adhesive strip edges start to loosen and curl up, you may trim the loose edges. Do not remove adhesive strips completely unless your health care provider tells you to do that. °· Keep your dressing dry until it has been removed. °· Check your incision area every day for signs of infection. Check for: °? Redness, swelling, or pain. °? Fluid or blood. °? Warmth. °? Pus or a bad smell. °Bathing °· Do not take baths, swim, or use a hot tub until your health care provider approves. You may take showers. °· After your dressing has been removed, use soap and water to gently wash your incision area. Do not use anything else to clean your incision(s) unless your health care provider tells you to do this. °Driving °· Do not drive until your health care provider approves. °· Do not drive   or use heavy machinery while taking prescription pain medicine. °Eating and drinking °· Eat a healthy, balanced diet as instructed by your health care provider. A healthy diet includes plenty of fresh fruits and vegetables, whole grains, and low-fat (lean) proteins. °· Limit foods that are high in fat and processed sugars, such as fried and sweet foods. °· Drink enough fluid to keep your urine clear or pale yellow. °General  instructions °· To prevent or treat constipation while you are taking prescription pain medicine, your health care provider may recommend that you: °? Take over-the-counter or prescription medicines. °? Eat foods that are high in fiber, such as beans, fresh fruits and vegetables, and whole grains. °· Do not use any products that contain nicotine or tobacco, such as cigarettes and e-cigarettes. If you need help quitting, ask your health care provider. °· Avoid secondhand smoke. °· Wear compression stockings as told by your health care provider. These stockings help to prevent blood clots and reduce swelling in your legs. °· If you have a chest tube, care for it as instructed by your health care provider. Do not travel by airplane during the 2 weeks after your chest tube is removed, or until your health care provider says that this is safe. °· Keep all follow-up visits as told by your health care provider. This is important. °Contact a health care provider if: °· You have redness, swelling, or pain around an incision. °· You have fluid or blood coming from an incision. °· Your incision area feels warm to the touch. °· You have pus or a bad smell coming from an incision. °· You have a fever or chills. °· You have nausea or vomiting. °· You have pain that does not get better with medicine. °Get help right away if: °· You have chest pain. °· Your heart is fluttering or beating rapidly. °· You develop a rash. °· You have shortness of breath or trouble breathing. °· You are confused. °· You have trouble speaking. °· You feel weak, light-headed, or dizzy. °· You faint. °Summary °· To help prevent lung infection (pneumonia), take deep breaths or do breathing exercises as instructed by your health care provider. °· Cough frequently to clear mucus (phlegm) and expand your lungs. If it hurts to cough, hold a pillow against your chest or place the palms of both hands on top of the incision (use splinting) when you cough. °· If  you have pain, take pain-relieving medicine before your pain becomes severe. This is important because if your pain is under control, you will be able to breathe and cough more comfortably. °· Ask your health care provider what activities are safe for you. °This information is not intended to replace advice given to you by your health care provider. Make sure you discuss any questions you have with your health care provider. °Document Released: 09/03/2012 Document Revised: 04/18/2016 Document Reviewed: 04/18/2016 °Elsevier Interactive Patient Education © 2017 [employer] ° °

## 2018-02-17 ENCOUNTER — Inpatient Hospital Stay (HOSPITAL_COMMUNITY): Payer: Medicare PPO

## 2018-02-17 LAB — GLUCOSE, CAPILLARY
Glucose-Capillary: 108 mg/dL — ABNORMAL HIGH (ref 70–99)
Glucose-Capillary: 74 mg/dL (ref 70–99)
Glucose-Capillary: 79 mg/dL (ref 70–99)
Glucose-Capillary: 88 mg/dL (ref 70–99)
Glucose-Capillary: 91 mg/dL (ref 70–99)

## 2018-02-17 MED ORDER — FENTANYL 40 MCG/ML IV SOLN
INTRAVENOUS | Status: AC
  Filled 2018-02-17: qty 25

## 2018-02-17 NOTE — Progress Notes (Signed)
PCA pump discontinued . Fentanyl 40mls  Wasted . Witnessed by [employer].

## 2018-02-17 NOTE — Progress Notes (Signed)
3 Days Post-Op Procedure(s) (LRB): VIDEO BRONCHOSCOPY (N/A) RIGHT  VIDEO ASSISTED THORACOSCOPY (VATS) WITH RIGHT LOWER LOBE (RLL) WEDGE RESECTION, COMPLETION RLL LOBECTOMY, LYMPH NODE DISSECTION, ON-Q PLACEMENT (Right) Subjective: No complaints this AM  Objective: Vital signs in last 24 hours: Temp:  [97.8 F (36.6 C)-98.7 F (37.1 C)] 98.7 F (37.1 C) (09/28 0740) Pulse Rate:  [56-60] 57 (09/28 0406) Cardiac Rhythm: Sinus bradycardia (09/28 0406) Resp:  [15-20] 18 (09/28 0759) BP: (105-141)/(67-87) 105/78 (09/28 0740) SpO2:  [94 %-100 %] 99 % (09/28 0759)  Hemodynamic parameters for last 24 hours:    Intake/Output from previous day: 09/27 0701 - 09/28 0700 In: 480.1 [P.O.:240; I.V.:240.1] Out: 610 [Urine:400; Chest Tube:210] Intake/Output this shift: No intake/output data recorded.  General appearance: alert, cooperative and no distress Neurologic: intact Heart: regular rate and rhythm Lungs: diminished breath sounds right base no air leak  Lab Results: Recent Labs    02/15/18 0538 02/16/18 0335  WBC 11.6* 12.5*  HGB 12.9 13.1  HCT 39.5 41.3  PLT 233 226   BMET:  Recent Labs    02/15/18 0538 02/16/18 0335  NA 134* 139  K 3.8 4.0  CL 101 104  CO2 24 28  GLUCOSE 146* 103*  BUN 9 17  CREATININE 0.81 0.94  CALCIUM 8.6* 8.8*    PT/INR: No results for input(s): LABPROT, INR in the last 72 hours. ABG    Component Value Date/Time   PHART 7.378 02/15/2018 0325   HCO3 25.8 02/15/2018 0325   O2SAT 96.9 02/15/2018 0325   CBG (last 3)  Recent Labs    02/17/18 0055 02/17/18 0403 02/17/18 0821  GLUCAP 91 79 74    Assessment/Plan: S/P Procedure(s) (LRB): VIDEO BRONCHOSCOPY (N/A) RIGHT  VIDEO ASSISTED THORACOSCOPY (VATS) WITH RIGHT LOWER LOBE (RLL) WEDGE RESECTION, COMPLETION RLL LOBECTOMY, LYMPH NODE DISSECTION, ON-Q PLACEMENT (Right) -POD # 3 Right lower lobectomy No air leak- will dc chest tube DC PCA, use PO pain meds Continue ambulation CBG  normal- dc CBGs   LOS: 3 days    Melrose Nakayama 02/17/2018

## 2018-02-18 ENCOUNTER — Inpatient Hospital Stay (HOSPITAL_COMMUNITY): Payer: Medicare PPO

## 2018-02-18 LAB — GLUCOSE, CAPILLARY: Glucose-Capillary: 98 mg/dL (ref 70–99)

## 2018-02-18 MED ORDER — ACETAMINOPHEN 500 MG PO TABS: 1000.0000 mg | ORAL_TABLET | Freq: Four times a day (QID) | ORAL | 0 refills | Status: DC

## 2018-02-18 MED ORDER — OXYCODONE HCL 5 MG PO TABS: 5.0000 mg | ORAL_TABLET | Freq: Four times a day (QID) | ORAL | 0 refills | Status: DC | PRN

## 2018-02-18 MED ORDER — LISINOPRIL 10 MG PO TABS: 10.0000 mg | ORAL_TABLET | Freq: Every day | ORAL | 1 refills | Status: DC

## 2018-02-18 NOTE — Progress Notes (Signed)
4 Days Post-Op Procedure(s) (LRB): VIDEO BRONCHOSCOPY (N/A) RIGHT  VIDEO ASSISTED THORACOSCOPY (VATS) WITH RIGHT LOWER LOBE (RLL) WEDGE RESECTION, COMPLETION RLL LOBECTOMY, LYMPH NODE DISSECTION, ON-Q PLACEMENT (Right) Subjective: No complaints this AM, wants to go home  Objective: Vital signs in last 24 hours: Temp:  [97.9 F (36.6 C)-98.8 F (37.1 C)] 98.2 F (36.8 C) (09/29 0802) Pulse Rate:  [57-65] 57 (09/29 0411) Cardiac Rhythm: (P) Normal sinus rhythm (09/29 0800) Resp:  [19-22] 21 (09/29 0411) BP: (101-119)/(61-79) 119/76 (09/29 0802) SpO2:  [98 %-99 %] 98 % (09/29 0802)  Hemodynamic parameters for last 24 hours:    Intake/Output from previous day: 09/28 0701 - 09/29 0700 In: 480 [P.O.:480] Out: -  Intake/Output this shift: No intake/output data recorded.  General appearance: alert, cooperative and no distress Neurologic: intact Heart: regular rate and rhythm Lungs: diminished breath sounds right base Wound clean and dry  Lab Results: Recent Labs    02/16/18 0335  WBC 12.5*  HGB 13.1  HCT 41.3  PLT 226   BMET:  Recent Labs    02/16/18 0335  NA 139  K 4.0  CL 104  CO2 28  GLUCOSE 103*  BUN 17  CREATININE 0.94  CALCIUM 8.8*    PT/INR: No results for input(s): LABPROT, INR in the last 72 hours. ABG    Component Value Date/Time   PHART 7.378 02/15/2018 0325   HCO3 25.8 02/15/2018 0325   O2SAT 96.9 02/15/2018 0325   CBG (last 3)  Recent Labs    02/17/18 1640 02/17/18 1937 02/18/18 0807  GLUCAP 88 108* 98    Assessment/Plan: S/P Procedure(s) (LRB): VIDEO BRONCHOSCOPY (N/A) RIGHT  VIDEO ASSISTED THORACOSCOPY (VATS) WITH RIGHT LOWER LOBE (RLL) WEDGE RESECTION, COMPLETION RLL LOBECTOMY, LYMPH NODE DISSECTION, ON-Q PLACEMENT (Right) Plan for discharge: see discharge orders  DC central line Tolerating Pos, on RA, pain well controlled DC home later today   LOS: 4 days    Shelby Green 02/18/2018

## 2018-02-18 NOTE — Progress Notes (Signed)
Nsg Discharge Note  Admit Date:  02/14/2018 Discharge date: 02/18/2018   Shelby Green to be D/C'd home per MD order.  AVS completed.  Copy for chart, and copy for patient signed, and dated. Patient/caregiver able to verbalize understanding.  Discharge Medication: Allergies as of 02/18/2018      Reactions   Penicillin G Itching   Has patient had a PCN reaction causing immediate rash, facial/tongue/throat swelling, SOB or lightheadedness with hypotension: No Has patient had a PCN reaction causing severe rash involving mucus membranes or skin necrosis: No Has patient had a PCN reaction that required hospitalization: No Has patient had a PCN reaction occurring within the last 10 years: No If all of the above answers are "NO", then may proceed with Cephalosporin use.      Medication List    TAKE these medications   acetaminophen 500 MG tablet Commonly known as:  TYLENOL Take 2 tablets (1,000 mg total) by mouth every 6 (six) hours.   lisinopril 10 MG tablet Commonly known as:  PRINIVIL,ZESTRIL Take 1 tablet (10 mg total) by mouth daily. Start taking on:  02/19/2018   oxyCODONE 5 MG immediate release tablet Commonly known as:  Oxy IR/ROXICODONE Take 1 tablet (5 mg total) by mouth every 6 (six) hours as needed for severe pain.   Travoprost (BAK Free) 0.004 % Soln ophthalmic solution Commonly known as:  TRAVATAN Place 1 drop into both eyes at bedtime.       Discharge Assessment: Vitals:   02/18/18 0411 02/18/18 0802  BP: 115/71 119/76  Pulse: (!) 57   Resp: (!) 21   Temp: 98.2 F (36.8 C) 98.2 F (36.8 C)  SpO2: 98% 98%   Skin clean, dry and intact without evidence of skin break down, no evidence of skin tears noted. IV catheter discontinued intact. Site without signs and symptoms of complications - no redness or edema noted at insertion site, patient denies c/o pain - only slight tenderness at site.  Dressing with slight pressure applied.  D/c  Instructions-Education: Discharge instructions given to patient/family with verbalized understanding. D/c education completed with patient/family including follow up instructions, medication list, d/c activities limitations if indicated, with other d/c instructions as indicated by MD - patient able to verbalize understanding, all questions fully answered. Patient instructed to return to ED, call 911, or call MD for any changes in condition.  Patient escorted via [place], and D/C home via private auto.  Earlie Server Tyrese Capriotti RN 02/18/2018 12:11 PM

## 2018-02-18 NOTE — Progress Notes (Signed)
Patient left unit in wheelchair accompanied by  Transporter to have cxr done.

## 2018-02-18 NOTE — Progress Notes (Signed)
Patient returned fomr xray department.

## 2018-02-19 ENCOUNTER — Encounter: Payer: Self-pay | Admitting: Cardiothoracic Surgery

## 2018-02-19 DIAGNOSIS — C3491 Malignant neoplasm of unspecified part of right bronchus or lung: Secondary | ICD-10-CM | POA: Insufficient documentation

## 2018-02-19 NOTE — Op Note (Signed)
NAME: Shelby Green, Shelby Green. MEDICAL RECORD [id number] ACCOUNT [id number] DATE OF BIRTH:26-Jun-1950 FACILITY: [facility] LOCATION: [facility] PHYSICIAN:Mishal Probert Maryruth Bun, MD  OPERATIVE REPORT  DATE OF PROCEDURE:  02/14/2018  PREOPERATIVE DIAGNOSIS:  Right lower lobe lung mass.  POSTOPERATIVE DIAGNOSIS:  Right lower lobe lung mass.  Nonsmall nonsmall-cell lung cancer.  PROCEDURE PERFORMED:  Bronchoscopy, right video-assisted thoracoscopy, wedge resection of the right lower lobe, completion right lower lobectomy with lymph node dissection and On-Q placement.  SURGEON:  Lanelle Bal, MD  FIRST ASSISTANT:  Nicholes Rough, PA.  BRIEF HISTORY:  The patient had been followed for several months with a small right lower lobe lung mass.  Initially thought possibly inflammatory.  However, its persistence between June and August on serial CT scans done by Dr. Glade Lloyd indicates that the  mass had not resolved and she was referred for surgical treatment.  Risks and options of surgery were discussed with the patient and she was agreeable with proceeding with the most direct approach including bronchoscopy, right video-assisted  thoracoscopy, wedge resection of the mass and possible lobectomy if it was malignant.  Risks and options were discussed with her in detail.  She was agreeable and signed informed consent.  Pulmonary function studies preop were adequate for lobectomy.  DESCRIPTION OF PROCEDURE:  The patient underwent general endotracheal anesthesia without incident.  Arterial line and central line had been placed by anesthesia.  Appropriate timeout was performed and we proceeded with video bronchoscopy to the  subsegmental level in the right and left tracheobronchial tree.  The double lumen endotracheal tube was in good position.  The scope was removed.  The patient was turned in lateral decubitus position with the right side up.  The right side had been  previously marked.  Radiographs were confirmatory.  The  right chest was prepped with Betadine and draped in the usual sterile manner.  We initially made 3 port sites, 1 anterior and 1 posterior in approximately the sixth intercostal space and in the mid  axillary line slightly higher.  Through these 3 ports, we were able to mobilize the lung and identified an area on the surface of the right lower lobe that was suspicious for the mass.  We then proceeded with a staple and then performed a wedge  resection.  The specimen placed in a specimen bag and brought out through the incision.  The followup frozen section confirmed nonsmall-cell cancer.  We then proceeded with formal right lower lobectomy.  One of the port sites was enlarged slightly and  became our utilitary incision and through the other 2 port sites.  We proceeded with dividing the inferior pulmonary ligament, encircling the inferior pulmonary vein and divided it with a vascular stapler.  The fissure was complete for the most part.   The arterial branches to the lower lobe were easily encircled and stapled with a vascular stapler.  The green load stapler was then passed across the right lower lobe bronchus.  The inflation of the lung was tested in the upper and middle lobe, inflated  nicely.  The stapler was fired and the specimen placed in a large specimen bag and brought out through the incision.  We then proceeded with sampling the lymph nodes in a systematic manner.  Each were labeled as their appropriate anatomic location.  An  On-Q catheter was tunneled subpleurally along posteriorly.  One small incision was prepared to be closed.  Chest tubes 1 anterior and 1 posterior were placed through the other 2 port sites.  The lung inflated nicely.  The incision was closed with  interrupted 0 Vicryl, running 3-0 Vicryl, subcutaneous tissue and 4-0 subcuticular stitch in skin edges.  Dermabond was placed on the incision.  There was no air leak at the completion of the procedure.  The patient tolerated the  procedure without  obvious complication.  Sponge and needle count was reported as correct.  Total estimated blood loss was 100 mL.  The patient was transferred to the recovery room for further postoperative observation and care.  AN/NUANCE  D:02/19/2018 T:02/19/2018 JOB:[id number]

## 2018-02-20 ENCOUNTER — Telehealth: Payer: Self-pay | Admitting: Cardiothoracic Surgery

## 2018-02-20 NOTE — Telephone Encounter (Signed)
Patient called to review the final pathology report on her recent lung resection. Cancer Staging Bronchogenic lung cancer, right lower lobe ; [facility]) Staging form: Lung, AJCC 8th Edition - Pathologic stage from 02/19/2018: Stage IB (pT2a, pN0, cM0) - Signed by Grace Isaac, MD on 02/19/2018

## 2018-02-26 ENCOUNTER — Ambulatory Visit (INDEPENDENT_AMBULATORY_CARE_PROVIDER_SITE_OTHER): Payer: Self-pay

## 2018-02-26 ENCOUNTER — Other Ambulatory Visit: Payer: Self-pay

## 2018-02-26 DIAGNOSIS — Z4802 Encounter for removal of sutures: Secondary | ICD-10-CM

## 2018-02-26 NOTE — Progress Notes (Signed)
Patient arrived for nurse visit to remove 5 sutures post- procedure VATS on 02/14/18 with Dr. Servando Snare.  Sutures removed with no signs/ symptoms of infection noted.  Patient tolerated procedure well.  Patient/ family instructed to keep the incision sites clean and dry.  Patient/ family acknowledged instructions given.  She is also aware of her return appointment.

## 2018-03-06 ENCOUNTER — Other Ambulatory Visit: Payer: Self-pay

## 2018-03-06 ENCOUNTER — Ambulatory Visit
Admission: RE | Admit: 2018-03-06 | Discharge: 2018-03-06 | Disposition: A | Discharge status: Home / Self Care | Payer: Medicare PPO | Source: Ambulatory Visit | Attending: Thoracic Surgery (Cardiothoracic Vascular Surgery) | Admitting: Thoracic Surgery (Cardiothoracic Vascular Surgery)

## 2018-03-06 ENCOUNTER — Other Ambulatory Visit: Payer: Self-pay | Admitting: Cardiothoracic Surgery

## 2018-03-06 ENCOUNTER — Encounter: Payer: Self-pay | Admitting: Physician Assistant

## 2018-03-06 ENCOUNTER — Ambulatory Visit (INDEPENDENT_AMBULATORY_CARE_PROVIDER_SITE_OTHER): Payer: Self-pay | Admitting: Physician Assistant

## 2018-03-06 VITALS — BP 140/95 | HR 86 | Resp 16 | Ht 64.0 in | Wt 155.4 lb

## 2018-03-06 DIAGNOSIS — C3491 Malignant neoplasm of unspecified part of right bronchus or lung: Secondary | ICD-10-CM

## 2018-03-06 DIAGNOSIS — Z902 Acquired absence of lung [part of]: Secondary | ICD-10-CM

## 2018-03-06 NOTE — Progress Notes (Signed)
Surgery: Bronchoscopy, right video-assisted thoracoscopy, wedge resection of the right lower lobe, completion right lower lobectomy with lymph node dissection and On-Q placement by Dr. Servando Snare on 02/14/2018.  HPI: Patient returns for routine postoperative follow-up. Pathology revealed Stage IB. Since hospital discharge, the patient reports she is feeling fairly well, She at times has some discomfort on the right side.    Current Outpatient Medications  Medication Sig Dispense Refill  . acetaminophen (TYLENOL) 500 MG tablet Take 2 tablets (1,000 mg total) by mouth every 6 (six) hours. 30 tablet 0  . lisinopril (PRINIVIL,ZESTRIL) 10 MG tablet Take 1 tablet (10 mg total) by mouth daily. 30 tablet 1  . oxyCODONE (OXY IR/ROXICODONE) 5 MG immediate release tablet Take 1 tablet (5 mg total) by mouth every 6 (six) hours as needed for severe pain. 30 tablet 0  . Travoprost, BAK Free, (TRAVATAN) 0.004 % SOLN ophthalmic solution Place 1 drop into both eyes at bedtime.    Vital Signs: BP 140/95, HR 86,  RR 16,  Oxygenation 97% on room air  Physical Exam: CV-RRR Pulmonary-Clear to auscultation on the left and slightly diminished at right base Extremities-No LE edema Wound-Clean and dry.   Diagnostic Tests: CLINICAL DATA:  Right lung surgery.  Soreness right chest.  EXAM: CHEST - 2 VIEW  COMPARISON:  02/18/2018.  FINDINGS: Interim removal of right central line. Mediastinum and hilar structures are normal. Postsurgical changes right chest with right base atelectatic changes and small right pleural effusion. No pneumothorax. Stable mild cardiomegaly. No pulmonary venous congestion. No acute bony abnormality.  IMPRESSION: 1.  Interim removal of right tube. 2. Postsurgical changes right chest with mild right base atelectasis and small right pleural effusion again noted.   Electronically Signed   By: Marcello Moores  Register   On: 03/06/2018 12:48  Impression and Plan: Overall, she is  recovering from right lower lobectomy. She is not taking narcotics for pain. She was instructed she may drive. She was started on Lisinopril in the hospital for high blood pressure. Her systolic blood pressure is elevated on this visit. She does have a medical doctor so I asked her to make an appointment as may need to increase Lisinopril or add a second medications if blood pressure is still elevated. She will return for further surveillance to see Dr. Servando Snare in 4-6 weeks.     Nani Skillern, PA-C [facility] [phone]

## 2018-03-12 ENCOUNTER — Other Ambulatory Visit: Payer: Self-pay | Admitting: Physician Assistant

## 2018-04-05 ENCOUNTER — Encounter: Payer: Self-pay | Admitting: Cardiothoracic Surgery

## 2018-04-05 ENCOUNTER — Ambulatory Visit (INDEPENDENT_AMBULATORY_CARE_PROVIDER_SITE_OTHER): Payer: Self-pay | Admitting: Cardiothoracic Surgery

## 2018-04-05 ENCOUNTER — Other Ambulatory Visit: Payer: Self-pay

## 2018-04-05 VITALS — BP 132/88 | HR 59 | Resp 18 | Ht 64.0 in | Wt 157.0 lb

## 2018-04-05 DIAGNOSIS — Z902 Acquired absence of lung [part of]: Secondary | ICD-10-CM

## 2018-04-05 NOTE — Progress Notes (Signed)
[address]       [place] [address]             [phone]      S[place] Medical Record #[id number] Date of Birth: Sep 08, 1950  Referring: Gwenevere Ghazi, MD Primary Care: [name], PA-C Primary Cardiologist: No primary care provider on file.   Chief Complaint:   POST OP FOLLOW UP OPERATIVE REPORT DATE OF PROCEDURE:  02/14/2018 PREOPERATIVE DIAGNOSIS:  Right lower lobe lung mass. POSTOPERATIVE DIAGNOSIS:  Right lower lobe lung mass.  Nonsmall nonsmall-cell lung cancer. PROCEDURE PERFORMED:  Bronchoscopy, right video-assisted thoracoscopy, wedge resection of the right lower lobe, completion right lower lobectomy with lymph node dissection and On-Q placement. SURGEON:  Lanelle Bal, MD  History of Present Illness:     Patient doing well since resection of a right lower lobe lung mass that was found to be a stage Ib non-small cell lung cancer.  She has tolerated resection without difficulty.  She is returning to near normal activities without problem.  Cancer Staging Bronchogenic lung cancer, right lower lobe ; [facility]) Staging form: Lung, AJCC 8th Edition - Pathologic stage from 02/19/2018: Stage IB (pT2a, pN0, cM0) - Signed by Grace Isaac, MD on 02/19/2018     Past Medical History:  Diagnosis Date  . Allergy   . Back pain   . Hypertension   . Hypertension   . Lung mass   . Wears dentures   . Wears glasses      Social History   Tobacco Use  Smoking Status Never Smoker  Smokeless Tobacco Never Used    Social History   Substance and Sexual Activity  Alcohol Use No     Allergies  Allergen Reactions  . Penicillin G Itching    Has patient had a PCN reaction causing immediate rash, facial/tongue/throat swelling, SOB or lightheadedness with hypotension: No Has patient had a PCN reaction causing severe rash involving mucus membranes or skin necrosis: No Has patient had a PCN reaction that required  hospitalization: No Has patient had a PCN reaction occurring within the last 10 years: No If all of the above answers are "NO", then may proceed with Cephalosporin use.     Current Outpatient Medications  Medication Sig Dispense Refill  . acetaminophen (TYLENOL) 500 MG tablet Take 2 tablets (1,000 mg total) by mouth every 6 (six) hours. 30 tablet 0  . lisinopril (PRINIVIL,ZESTRIL) 10 MG tablet Take 1 tablet (10 mg total) by mouth daily. 30 tablet 1  . amLODipine (NORVASC) 5 MG tablet Take 5 mg by mouth daily.  1  . latanoprost (XALATAN) 0.005 % ophthalmic solution      No current facility-administered medications for this visit.        Physical Exam: BP 132/88 (BP Location: Left Arm, Patient Position: Sitting, Cuff Size: Normal)   Pulse (!) 59   Resp 18   Ht 5\' 4"  (1.626 m)   Wt 157 lb (71.2 kg)   SpO2 97% Comment: RA  BMI 26.95 kg/m   General appearance: alert and cooperative Neurologic: intact Heart: regular rate and rhythm, S1, S2 normal, no murmur, click, rub or gallop Lungs: clear to auscultation bilaterally Abdomen: soft, non-tender; bowel sounds normal; no masses,  no organomegaly Extremities: extremities normal, atraumatic, no cyanosis or edema Wound: Incisions over the right chest are healing well   Diagnostic Studies & Laboratory data:     Recent Radiology Findings:  No chest x-ray  done today several weeks ago her chest x-ray appeared clear postop.  Recent Lab Findings: Lab Results  Component Value Date   WBC 12.5 (H) 02/16/2018   HGB 13.1 02/16/2018   HCT 41.3 02/16/2018   PLT 226 02/16/2018   GLUCOSE 103 (H) 02/16/2018   ALT 22 02/16/2018   AST 24 02/16/2018   NA 139 02/16/2018   K 4.0 02/16/2018   CL 104 02/16/2018   CREATININE 0.94 02/16/2018   BUN 17 02/16/2018   CO2 28 02/16/2018   TSH 1.532 08/01/2012   INR 1.00 02/13/2018      Assessment / Plan:      Patient now status post right lower lobectomy for stage Ib non-small cell carcinoma  the lung.  The patient's case was presented at the multidisciplinary thoracic oncology conference, no further therapy other than follow-up was recommended.  The patient will return 6 months postop with a follow-up CT of the chest.   Grace Isaac MD      [address] [place] [address] Office [phone]   Beeper [phone]  04/05/2018 11:58 AM

## 2018-06-21 ENCOUNTER — Other Ambulatory Visit: Payer: Self-pay | Admitting: Cardiothoracic Surgery

## 2018-06-21 DIAGNOSIS — C3490 Malignant neoplasm of unspecified part of unspecified bronchus or lung: Secondary | ICD-10-CM

## 2018-06-21 DIAGNOSIS — C3491 Malignant neoplasm of unspecified part of right bronchus or lung: Secondary | ICD-10-CM

## 2018-08-09 ENCOUNTER — Telehealth: Payer: Self-pay | Admitting: Cardiothoracic Surgery

## 2018-08-09 ENCOUNTER — Ambulatory Visit
Admission: RE | Admit: 2018-08-09 | Discharge: 2018-08-09 | Disposition: A | Discharge status: Home / Self Care | Payer: Medicare PPO | Source: Ambulatory Visit | Attending: Cardiothoracic Surgery | Admitting: Cardiothoracic Surgery

## 2018-08-09 ENCOUNTER — Ambulatory Visit: Payer: Medicare PPO | Admitting: Cardiothoracic Surgery

## 2018-08-09 ENCOUNTER — Other Ambulatory Visit: Payer: Self-pay

## 2018-08-09 DIAGNOSIS — C3490 Malignant neoplasm of unspecified part of unspecified bronchus or lung: Secondary | ICD-10-CM

## 2018-08-09 NOTE — Telephone Encounter (Signed)
[address]       [place] [address]             [phone]      Magenta W [place] Medical Record #[id number] Date of Birth: Nov 14, 1950 Referring: Gwenevere Ghazi, MD Primary Care: [name], PA-C Primary Cardiologist: No primary care provider on file.   Chief Complaint:   POST OP FOLLOW UP OPERATIVE REPORT DATE OF PROCEDURE: 02/14/2018 PREOPERATIVE DIAGNOSIS: Right lower lobe lung mass. POSTOPERATIVE DIAGNOSIS: Right lower lobe lung mass. Nonsmall nonsmall-cell lung cancer. PROCEDURE PERFORMED: Bronchoscopy, right video-assisted thoracoscopy, wedge resection of the right lower lobe, completion right lower lobectomy with lymph node dissection and On-Q placement. SURGEON: Lanelle Bal, MD   History of Present Illness:      Due to covid restrictions patient had her CT scan, but preferred not to come to the office.  I discussed over the phone with her her symptoms.  Since last seen she has been doing fine, denies shortness of breath, remains active walking 3 to 4 miles a day, has had no respiratory difficulties or infection    Past Medical History:  Diagnosis Date   Allergy    Back pain    Hypertension    Hypertension    Lung mass    Wears dentures    Wears glasses      Social History   Tobacco Use  Smoking Status Never Smoker  Smokeless Tobacco Never Used    Social History   Substance and Sexual Activity  Alcohol Use No     Allergies  Allergen Reactions   Penicillin G Itching    Has patient had a PCN reaction causing immediate rash, facial/tongue/throat swelling, SOB or lightheadedness with hypotension: No Has patient had a PCN reaction causing severe rash involving mucus membranes or skin necrosis: No Has patient had a PCN reaction that required hospitalization: No Has patient had a PCN reaction occurring within the last 10 years: No If all of the above answers are "NO", then may proceed with  Cephalosporin use.     Current Outpatient Medications  Medication Sig Dispense Refill   acetaminophen (TYLENOL) 500 MG tablet Take 2 tablets (1,000 mg total) by mouth every 6 (six) hours. 30 tablet 0   amLODipine (NORVASC) 5 MG tablet Take 5 mg by mouth daily.  1   latanoprost (XALATAN) 0.005 % ophthalmic solution      lisinopril (PRINIVIL,ZESTRIL) 10 MG tablet Take 1 tablet (10 mg total) by mouth daily. 30 tablet 1   No current facility-administered medications for this visit.       Diagnostic Studies & Laboratory data:     Recent Radiology Findings:   Ct Chest Wo Contrast  Result Date: 08/09/2018 CLINICAL DATA:  68 year old female with history of lung cancer. Chronic cough. No current fever. EXAM: CT CHEST WITHOUT CONTRAST TECHNIQUE: Multidetector CT imaging of the chest was performed following the standard protocol without IV contrast. COMPARISON:  PET-CT 11/17/2017. FINDINGS: Cardiovascular: Heart size is normal. There is no significant pericardial fluid, thickening or pericardial calcification. Aortic atherosclerosis. Mediastinum/Nodes: No pathologically enlarged mediastinal or hilar lymph nodes. Please note that accurate exclusion of hilar adenopathy is limited on noncontrast CT scans. Esophagus is unremarkable in appearance. No axillary lymphadenopathy. Lungs/Pleura: Status post right lower lobectomy. Compensatory hyperexpansion of the right upper and right middle lobes. Mild pleural thickening in the right hemithorax. No acute consolidative airspace disease. No pleural effusions. Tiny 2 mm right middle lobe pulmonary  nodule (axial image 98 of series 8) and 3 mm ground-glass attenuation left upper lobe nodule (axial image 34 of series 8). No other larger more suspicious appearing pulmonary nodules or masses are noted. Upper Abdomen: Unremarkable. Musculoskeletal: Post thoracotomy changes in the right chest wall. There are no aggressive appearing lytic or blastic lesions noted in the  visualized portions of the skeleton. IMPRESSION: 1. Status post right lower lobectomy. No findings to suggest metastatic disease in the thorax. 2. Tiny nonspecific pulmonary nodules measuring 3 mm or less in size in the lungs bilaterally, strongly favored to be benign. Attention on follow-up studies is recommended. 3. Aortic atherosclerosis. Electronically Signed   By: Vinnie Langton M.D.   On: 08/09/2018 11:05   I have independently reviewed the above radiology studies  and reviewed the findings with the patient.    Recent Lab Findings: Lab Results  Component Value Date   WBC 12.5 (H) 02/16/2018   HGB 13.1 02/16/2018   HCT 41.3 02/16/2018   PLT 226 02/16/2018   GLUCOSE 103 (H) 02/16/2018   ALT 22 02/16/2018   AST 24 02/16/2018   NA 139 02/16/2018   K 4.0 02/16/2018   CL 104 02/16/2018   CREATININE 0.94 02/16/2018   BUN 17 02/16/2018   CO2 28 02/16/2018   TSH 1.532 08/01/2012   INR 1.00 02/13/2018      Assessment / Plan:      I reviewed the patient's CT scans with her there is no evidence of recurrent disease, was explained to her that she needs to continue with episodic CT scans of the chest.  We will plan on her returning in 6 months with a follow-up CT scan of the chest no contrast   Grace Isaac MD      [address] [place] [address] Office [phone]   Beeper [phone]  08/09/2018 12:29 PM

## 2018-08-27 ENCOUNTER — Other Ambulatory Visit: Payer: Self-pay | Admitting: Physician Assistant

## 2018-08-27 DIAGNOSIS — Z1231 Encounter for screening mammogram for malignant neoplasm of breast: Secondary | ICD-10-CM

## 2018-10-23 ENCOUNTER — Other Ambulatory Visit: Payer: Self-pay

## 2018-10-23 ENCOUNTER — Ambulatory Visit
Admission: RE | Admit: 2018-10-23 | Discharge: 2018-10-23 | Disposition: A | Discharge status: Home / Self Care | Payer: Medicare PPO | Source: Ambulatory Visit | Attending: Physician Assistant | Admitting: Physician Assistant

## 2018-10-23 DIAGNOSIS — Z1231 Encounter for screening mammogram for malignant neoplasm of breast: Secondary | ICD-10-CM

## 2019-01-04 ENCOUNTER — Other Ambulatory Visit: Payer: Self-pay | Admitting: *Deleted

## 2019-01-04 DIAGNOSIS — C3490 Malignant neoplasm of unspecified part of unspecified bronchus or lung: Secondary | ICD-10-CM

## 2019-01-04 DIAGNOSIS — C3491 Malignant neoplasm of unspecified part of right bronchus or lung: Secondary | ICD-10-CM

## 2019-02-14 ENCOUNTER — Other Ambulatory Visit: Payer: Medicare PPO

## 2019-02-14 ENCOUNTER — Ambulatory Visit: Payer: Medicare PPO | Admitting: Cardiothoracic Surgery

## 2019-02-28 ENCOUNTER — Other Ambulatory Visit: Payer: Medicare PPO

## 2019-02-28 ENCOUNTER — Ambulatory Visit: Payer: Medicare PPO | Admitting: Cardiothoracic Surgery

## 2019-03-07 ENCOUNTER — Other Ambulatory Visit: Payer: Self-pay

## 2019-03-07 ENCOUNTER — Ambulatory Visit: Payer: Medicare PPO | Admitting: Cardiothoracic Surgery

## 2019-03-07 ENCOUNTER — Ambulatory Visit
Admission: RE | Admit: 2019-03-07 | Discharge: 2019-03-07 | Disposition: A | Discharge status: Home / Self Care | Payer: Medicare PPO | Source: Ambulatory Visit | Attending: Cardiothoracic Surgery | Admitting: Cardiothoracic Surgery

## 2019-03-07 VITALS — BP 145/85 | HR 64 | Temp 97.6°F | Resp 20 | Ht 64.0 in | Wt 156.0 lb

## 2019-03-07 DIAGNOSIS — C3491 Malignant neoplasm of unspecified part of right bronchus or lung: Secondary | ICD-10-CM | POA: Diagnosis not present

## 2019-03-07 DIAGNOSIS — C3490 Malignant neoplasm of unspecified part of unspecified bronchus or lung: Secondary | ICD-10-CM

## 2019-03-07 DIAGNOSIS — Z902 Acquired absence of lung [part of]: Secondary | ICD-10-CM | POA: Diagnosis not present

## 2019-03-07 NOTE — Progress Notes (Signed)
[address]       [place] [address]             [phone]      Shelby Green [place] Medical Record #[id number] Date of Birth: [birth year]  Referring: Gwenevere Ghazi, MD Primary Care: [name], PA-C Primary Cardiologist: No primary care provider on file.   Chief Complaint:   POST OP FOLLOW UP OPERATIVE REPORT DATE OF PROCEDURE:  02/14/2018 PREOPERATIVE DIAGNOSIS:  Right lower lobe lung mass. POSTOPERATIVE DIAGNOSIS:  Right lower lobe lung mass.  Nonsmall nonsmall-cell lung cancer. PROCEDURE PERFORMED:  Bronchoscopy, right video-assisted thoracoscopy, wedge resection of the right lower lobe, completion right lower lobectomy with lymph node dissection and On-Q placement. SURGEON:  Lanelle Bal, MD  History of Present Illness:     Patient doing well since resection of a right lower lobe lung mass that was found to be a stage Ib non-small cell lung cancer.  She has tolerated resection without difficulty.  She is returning to near normal activities without problem. Patient's had no respiratory symptoms.  She notes she is current on her flu vaccination     Cancer Staging Bronchogenic lung cancer, right lower lobe ; [facility]) Staging form: Lung, AJCC 8th Edition - Pathologic stage from 02/19/2018: Stage IB (pT2a, pN0, cM0) - Signed by Grace Isaac, MD on 02/19/2018     Past Medical History:  Diagnosis Date  . Allergy   . Back pain   . Hypertension   . Hypertension   . Lung mass   . Wears dentures   . Wears glasses      Social History   Tobacco Use  Smoking Status Never Smoker  Smokeless Tobacco Never Used    Social History   Substance and Sexual Activity  Alcohol Use No     Allergies  Allergen Reactions  . Penicillin G Itching    Has patient had a PCN reaction causing immediate rash, facial/tongue/throat swelling, SOB or lightheadedness with hypotension: No Has patient had a PCN reaction causing severe  rash involving mucus membranes or skin necrosis: No Has patient had a PCN reaction that required hospitalization: No Has patient had a PCN reaction occurring within the last 10 years: No If all of the above answers are "NO", then may proceed with Cephalosporin use.     Current Outpatient Medications  Medication Sig Dispense Refill  . latanoprost (XALATAN) 0.005 % ophthalmic solution      No current facility-administered medications for this visit.        Physical Exam: BP (!) 145/85   Pulse 64   Temp 97.6 F (36.4 C) (Skin)   Resp 20   Ht 5\' 4"  (1.626 m)   Wt 156 lb (70.8 kg)   BMI 26.78 kg/m   .General appearance: alert, cooperative and no distress Head: Normocephalic, without obvious abnormality, atraumatic Neck: no adenopathy, no carotid bruit, no JVD, supple, symmetrical, trachea midline and thyroid not enlarged, symmetric, no tenderness/mass/nodules Lymph nodes: Cervical, supraclavicular, and axillary nodes normal. Resp: clear to auscultation bilaterally Back: symmetric, no curvature. ROM normal. No CVA tenderness. Cardio: regular rate and rhythm, S1, S2 normal, no murmur, click, rub or gallop GI: soft, non-tender; bowel sounds normal; no masses,  no organomegaly Extremities: extremities normal, atraumatic, no cyanosis or edema and Homans sign is negative, no sign of DVT Neurologic: Grossly normal   Diagnostic Studies & Laboratory data:     Recent Radiology Findings:   Ct Chest  Wo Contrast  Result Date: 03/07/2019 CLINICAL DATA:  Followup non-small cell lung cancer treated with VATS/wedge resection. EXAM: CT CHEST WITHOUT CONTRAST TECHNIQUE: Multidetector CT imaging of the chest was performed following the standard protocol without IV contrast. COMPARISON:  08/09/2018 FINDINGS: Cardiovascular: The heart size appears within normal limits. No pericardial effusion. Aortic atherosclerosis. Mediastinum/Nodes: No enlarged mediastinal or axillary lymph nodes. Thyroid  gland, trachea, and esophagus demonstrate no significant findings. Lungs/Pleura: No pleural effusion. Status post right lower lobectomy. Mild pleural thickening overlying the right lung is noted. No airspace consolidation, atelectasis or pneumothorax. 2 mm posterior right middle lobe lung nodule is unchanged, image 83/5. 3 mm ground-glass attenuating nodule in the left upper lobe is unchanged, image 42/5. Solid-appearing nodule within the anterior left upper lobe measures 2 mm. This is more conspicuous compared with examination from 08/09/2018. Upper Abdomen: No acute abnormality identified. Left kidney cyst noted measuring 2.4 cm. Incompletely characterized without IV contrast material. Musculoskeletal: Thoracolumbar scoliosis. No acute or suspicious osseous abnormality. IMPRESSION: 1. Stable appearance of the right hemithorax status post right lower lobectomy. No findings to suggest local tumor recurrence or metastatic disease. 2. Previous 2 mm posterior right middle lobe lung nodule and 3 mm left upper lobe ground-glass nodule are unchanged. 3. There is a 2 mm anterior left upper lobe lung nodule which is more conspicuous compared with the previous exam. Nonspecific but worthy of attention on follow-up imaging. 4.  Aortic Atherosclerosis (ICD10-I70.0). Electronically Signed   By: Kerby Moors M.D.   On: 03/07/2019 11:21   I have independently reviewed the above radiology studies  and reviewed the findings with the patient.    Recent Lab Findings: Lab Results  Component Value Date   WBC 12.5 (H) 02/16/2018   HGB 13.1 02/16/2018   HCT 41.3 02/16/2018   PLT 226 02/16/2018   GLUCOSE 103 (H) 02/16/2018   ALT 22 02/16/2018   AST 24 02/16/2018   NA 139 02/16/2018   K 4.0 02/16/2018   CL 104 02/16/2018   CREATININE 0.94 02/16/2018   BUN 17 02/16/2018   CO2 28 02/16/2018   TSH 1.532 08/01/2012   INR 1.00 02/13/2018      Assessment / Plan:      Patient now 1 year  post right lower lobectomy  for stage Ib non-small cell carcinoma the lung.   Follow-up CT scan shows no obvious evidence of recurrence, scan was reviewed with the patient We we will obtain a follow-up CT scan in 6 months.   Grace Isaac MD      [address] [place] [address] Office [phone]   Beeper [phone]  03/07/2019 12:08 PM

## 2019-06-12 ENCOUNTER — Other Ambulatory Visit: Payer: Self-pay | Admitting: Physician Assistant

## 2019-06-12 DIAGNOSIS — E2839 Other primary ovarian failure: Secondary | ICD-10-CM

## 2019-06-14 ENCOUNTER — Other Ambulatory Visit: Payer: Self-pay

## 2019-06-14 ENCOUNTER — Ambulatory Visit
Admission: RE | Admit: 2019-06-14 | Discharge: 2019-06-14 | Disposition: A | Discharge status: Home / Self Care | Payer: Medicare PPO | Source: Ambulatory Visit | Attending: Physician Assistant | Admitting: Physician Assistant

## 2019-06-14 DIAGNOSIS — E2839 Other primary ovarian failure: Secondary | ICD-10-CM

## 2019-07-26 ENCOUNTER — Other Ambulatory Visit: Payer: Self-pay | Admitting: Cardiothoracic Surgery

## 2019-07-26 DIAGNOSIS — C3491 Malignant neoplasm of unspecified part of right bronchus or lung: Secondary | ICD-10-CM

## 2019-07-31 ENCOUNTER — Other Ambulatory Visit: Payer: Self-pay | Admitting: Cardiothoracic Surgery

## 2019-07-31 DIAGNOSIS — C3491 Malignant neoplasm of unspecified part of right bronchus or lung: Secondary | ICD-10-CM

## 2019-09-12 ENCOUNTER — Ambulatory Visit: Payer: Medicare PPO | Admitting: Cardiothoracic Surgery

## 2019-09-12 ENCOUNTER — Other Ambulatory Visit: Payer: Self-pay

## 2019-09-12 ENCOUNTER — Ambulatory Visit
Admission: RE | Admit: 2019-09-12 | Discharge: 2019-09-12 | Disposition: A | Discharge status: Home / Self Care | Payer: Medicare PPO | Source: Ambulatory Visit | Attending: Cardiothoracic Surgery | Admitting: Cardiothoracic Surgery

## 2019-09-12 VITALS — BP 140/84 | HR 62 | Temp 97.9°F | Resp 20 | Ht 64.0 in | Wt 152.0 lb

## 2019-09-12 DIAGNOSIS — Z902 Acquired absence of lung [part of]: Secondary | ICD-10-CM

## 2019-09-12 DIAGNOSIS — C3491 Malignant neoplasm of unspecified part of right bronchus or lung: Secondary | ICD-10-CM | POA: Diagnosis not present

## 2019-09-12 NOTE — Progress Notes (Signed)
[address]       [place] [address]             [phone]      Shelby Green [place] Medical Record #[id number] Date of Birth: Jan 19, 1951  Referring: Gwenevere Ghazi, MD Primary Care: [name], PA-C Primary Cardiologist: No primary care provider on file.   Chief Complaint:   POST OP FOLLOW UP OPERATIVE REPORT DATE OF PROCEDURE:  02/14/2018 PREOPERATIVE DIAGNOSIS:  Right lower lobe lung mass. POSTOPERATIVE DIAGNOSIS:  Right lower lobe lung mass.  Nonsmall nonsmall-cell lung cancer. PROCEDURE PERFORMED:  Bronchoscopy, right video-assisted thoracoscopy, wedge resection of the right lower lobe, completion right lower lobectomy with lymph node dissection and On-Q placement. SURGEON:  Lanelle Bal, MD  History of Present Illness:     Patient returns to the office today for follow-up visit after resection of a right lower lobe lung mass stage Ib non-small cell carcinoma .  She has tolerated resection without difficulty.  Patient is a lifelong non-smoker continues to be so.  She notes her husband does smoke heavily.    Cancer Staging Bronchogenic lung cancer, right lower lobe ; [facility] Staging form: Lung, AJCC 8th Edition - Pathologic stage from 02/19/2018: Stage IB (pT2a, pN0, cM0) - Signed by Grace Isaac, MD on 02/19/2018     Past Medical History:  Diagnosis Date  . Allergy   . Back pain   . Hypertension   . Hypertension   . Lung mass   . Wears dentures   . Wears glasses      Social History   Tobacco Use  Smoking Status Never Smoker  Smokeless Tobacco Never Used    Social History   Substance and Sexual Activity  Alcohol Use No     Allergies  Allergen Reactions  . Penicillin G Itching    Has patient had a PCN reaction causing immediate rash, facial/tongue/throat swelling, SOB or lightheadedness with hypotension: No Has patient had a PCN reaction causing severe rash involving mucus membranes or skin  necrosis: No Has patient had a PCN reaction that required hospitalization: No Has patient had a PCN reaction occurring within the last 10 years: No If all of the above answers are "NO", then may proceed with Cephalosporin use.     Current Outpatient Medications  Medication Sig Dispense Refill  . doxazosin (CARDURA) 2 MG tablet Take 2 mg by mouth daily.    Marland Kitchen latanoprost (XALATAN) 0.005 % ophthalmic solution      No current facility-administered medications for this visit.       Physical Exam: BP 140/84 (BP Location: Left Arm, Patient Position: Sitting, Cuff Size: Normal) Comment: manual check  Pulse 62   Temp 97.9 F (36.6 C) (Temporal)   Resp 20   Ht 5\' 4"  (1.626 m)   Wt 152 lb (68.9 kg)   SpO2 97% Comment: RA  BMI 26.09 kg/m  General appearance: alert, cooperative and no distress Neck: no adenopathy, no carotid bruit, no JVD, supple, symmetrical, trachea midline and thyroid not enlarged, symmetric, no tenderness/mass/nodules Lymph nodes: Cervical, supraclavicular, and axillary nodes normal. Resp: clear to auscultation bilaterally Cardio: regular rate and rhythm, S1, S2 normal, no murmur, click, rub or gallop GI: soft, non-tender; bowel sounds normal; no masses,  no organomegaly Extremities: extremities normal, atraumatic, no cyanosis or edema and Homans sign is negative, no sign of DVT Neurologic: Grossly normal   Diagnostic Studies & Laboratory data:     Recent  Radiology Findings:   CT Chest Wo Contrast  Result Date: 09/12/2019 CLINICAL DATA:  Small-cell lung cancer monitoring EXAM: CT CHEST WITHOUT CONTRAST TECHNIQUE: Multidetector CT imaging of the chest was performed following the standard protocol without IV contrast. COMPARISON:  03/07/2019 FINDINGS: Cardiovascular: Aortic atherosclerosis. Normal heart size. No pericardial effusion. Mediastinum/Nodes: No enlarged mediastinal, hilar, or axillary lymph nodes. Thyroid gland, trachea, and esophagus demonstrate no  significant findings. Lungs/Pleura: Redemonstrated postoperative findings of right lower lobectomy. Stable 3 mm pulmonary nodule of the right middle lobe (series 8, image 87). Interval enlargement of a pulmonary nodule in the anterior left upper lobe measuring 3 mm, with an adjacent new nodule measuring 3 mm (series 8, image 33, 37). Unchanged, small adjacent 3 mm ground-glass nodule. No pleural effusion or pneumothorax. Upper Abdomen: No acute abnormality. Musculoskeletal: No chest wall mass or suspicious bone lesions identified. IMPRESSION: 1.  Unchanged postoperative findings of right lower lobectomy. 2. New and enlarged adjacent nodules of the anterior left upper lobe measuring up to 3 mm. Given adjacent location, suspect these are infectious or inflammatory, and less likely manifestation of metastatic disease. Attention on follow-up. 3.  Aortic Atherosclerosis (ICD10-I70.0). Electronically Signed   By: Eddie Candle M.D.   On: 09/12/2019 15:25   I have independently reviewed the above radiology studies  and reviewed the findings with the patient.    Recent Lab Findings: Lab Results  Component Value Date   WBC 12.5 (H) 02/16/2018   HGB 13.1 02/16/2018   HCT 41.3 02/16/2018   PLT 226 02/16/2018   GLUCOSE 103 (H) 02/16/2018   ALT 22 02/16/2018   AST 24 02/16/2018   NA 139 02/16/2018   K 4.0 02/16/2018   CL 104 02/16/2018   CREATININE 0.94 02/16/2018   BUN 17 02/16/2018   CO2 28 02/16/2018   TSH 1.532 08/01/2012   INR 1.00 02/13/2018      Assessment / Plan:   #1 non-small cell carcinoma right lower lobe-resected stage Ib now approximately 1-1/2 years postop-no definitive evidence on exam or CT of recurrence.  We will plan on follow-up CT scan in 6 months.        Grace Isaac MD      [address] [place] [address] Office [phone]   Beeper [phone]  09/12/2019 3:29 PM

## 2019-11-29 ENCOUNTER — Other Ambulatory Visit: Payer: Self-pay | Admitting: Physician Assistant

## 2019-11-29 DIAGNOSIS — Z1231 Encounter for screening mammogram for malignant neoplasm of breast: Secondary | ICD-10-CM

## 2019-12-11 ENCOUNTER — Ambulatory Visit
Admission: RE | Admit: 2019-12-11 | Discharge: 2019-12-11 | Disposition: A | Discharge status: Home / Self Care | Payer: Medicare PPO | Source: Ambulatory Visit | Attending: Physician Assistant | Admitting: Physician Assistant

## 2019-12-11 ENCOUNTER — Other Ambulatory Visit: Payer: Self-pay

## 2019-12-11 DIAGNOSIS — Z1231 Encounter for screening mammogram for malignant neoplasm of breast: Secondary | ICD-10-CM

## 2020-02-10 ENCOUNTER — Other Ambulatory Visit: Payer: Self-pay | Admitting: *Deleted

## 2020-02-10 DIAGNOSIS — Z85118 Personal history of other malignant neoplasm of bronchus and lung: Secondary | ICD-10-CM

## 2020-03-19 ENCOUNTER — Encounter: Payer: Medicare PPO | Admitting: Cardiothoracic Surgery

## 2020-03-19 ENCOUNTER — Ambulatory Visit
Admission: RE | Admit: 2020-03-19 | Discharge: 2020-03-19 | Disposition: A | Discharge status: Home / Self Care | Payer: Medicare PPO | Source: Ambulatory Visit | Attending: Cardiothoracic Surgery | Admitting: Cardiothoracic Surgery

## 2020-03-19 DIAGNOSIS — Z85118 Personal history of other malignant neoplasm of bronchus and lung: Secondary | ICD-10-CM

## 2020-03-26 ENCOUNTER — Ambulatory Visit (INDEPENDENT_AMBULATORY_CARE_PROVIDER_SITE_OTHER): Payer: Medicare PPO | Admitting: Cardiothoracic Surgery

## 2020-03-26 ENCOUNTER — Other Ambulatory Visit: Payer: Self-pay | Admitting: *Deleted

## 2020-03-26 ENCOUNTER — Other Ambulatory Visit: Payer: Self-pay

## 2020-03-26 VITALS — BP 150/87 | HR 70 | Resp 20 | Ht 64.0 in | Wt 152.0 lb

## 2020-03-26 DIAGNOSIS — C3490 Malignant neoplasm of unspecified part of unspecified bronchus or lung: Secondary | ICD-10-CM

## 2020-03-26 DIAGNOSIS — C3491 Malignant neoplasm of unspecified part of right bronchus or lung: Secondary | ICD-10-CM | POA: Diagnosis not present

## 2020-03-26 NOTE — Progress Notes (Signed)
[address]       [place] [address]             [phone]      Shelby Green Medical Record #[id number] Date of Birth: 02-08-1951  Referring: Gwenevere Ghazi, MD Primary Care: [name], PA-C Primary Cardiologist: No primary care provider on file.   Chief Complaint:   POST OP FOLLOW UP OPERATIVE REPORT DATE OF PROCEDURE:  02/14/2018 PREOPERATIVE DIAGNOSIS:  Right lower lobe lung mass. POSTOPERATIVE DIAGNOSIS:  Right lower lobe lung mass.  Nonsmall nonsmall-cell lung cancer. PROCEDURE PERFORMED:  Bronchoscopy, right video-assisted thoracoscopy, wedge resection of the right lower lobe, completion right lower lobectomy with lymph node dissection and On-Q placement. SURGEON:  Lanelle Bal, MD  History of Present Illness:     Patient returns to the office today for follow-up visit after resection of a right lower lobe lung mass stage Ib non-small cell carcinoma, this was done approximately 13 months ago.  She has tolerated resection without difficulty.  Patient is a lifelong non-smoker continues to be so.  She notes her husband does smoke heavily. Since surgery she has been followed postoperatively with CT scan of the chest every 6 months.    Cancer Staging Bronchogenic lung cancer, right lower lobe ; [facility]) Staging form: Lung, AJCC 8th Edition - Pathologic stage from 02/19/2018: Stage IB (pT2a, pN0, cM0) - Signed by Grace Isaac, MD on 02/19/2018     Past Medical History:  Diagnosis Date  . Allergy   . Back pain   . Hypertension   . Hypertension   . Lung mass   . Wears dentures   . Wears glasses      Social History   Tobacco Use  Smoking Status Never Smoker  Smokeless Tobacco Never Used    Social History   Substance and Sexual Activity  Alcohol Use No     Allergies  Allergen Reactions  . Metoprolol Other (See Comments)    Had left shoulder/arm pain and left sided back pain  . Penicillin G  Itching    Has patient had a PCN reaction causing immediate rash, facial/tongue/throat swelling, SOB or lightheadedness with hypotension: No Has patient had a PCN reaction causing severe rash involving mucus membranes or skin necrosis: No Has patient had a PCN reaction that required hospitalization: No Has patient had a PCN reaction occurring within the last 10 years: No If all of the above answers are "NO", then may proceed with Cephalosporin use.     Current Outpatient Medications  Medication Sig Dispense Refill  . doxazosin (CARDURA) 2 MG tablet Take 2 mg by mouth daily.    Marland Kitchen latanoprost (XALATAN) 0.005 % ophthalmic solution      No current facility-administered medications for this visit.       Physical Exam: BP (!) 150/87   Pulse 70   Resp 20   Ht 5\' 4"  (1.626 m)   Wt 152 lb (68.9 kg)   SpO2 98% Comment: RA  BMI 26.09 kg/m  General appearance: alert, cooperative and no distress Head: Normocephalic, without obvious abnormality, atraumatic Neck: no adenopathy, no carotid bruit, no JVD, supple, symmetrical, trachea midline and thyroid not enlarged, symmetric, no tenderness/mass/nodules Lymph nodes: Cervical, supraclavicular, and axillary nodes normal. Resp: clear to auscultation bilaterally Cardio: regular rate and rhythm, S1, S2 normal, no murmur, click, rub or gallop GI: soft, non-tender; bowel sounds normal; no masses,  no organomegaly Extremities: extremities normal, atraumatic, no  cyanosis or edema and Homans sign is negative, no sign of DVT Neurologic: Grossly normal  Diagnostic Studies & Laboratory data:     Recent Radiology Findings:   CT CHEST WO CONTRAST  Result Date: 03/19/2020 CLINICAL DATA:  Right lung cancer, status post VATS.  Restaging. EXAM: CT CHEST WITHOUT CONTRAST TECHNIQUE: Multidetector CT imaging of the chest was performed following the standard protocol without IV contrast. COMPARISON:  09/12/2019 FINDINGS: Cardiovascular: Heart size upper  normal. Trace pericardial effusion noted. Atherosclerotic calcification is noted in the wall of the thoracic aorta. Mediastinum/Nodes: No mediastinal lymphadenopathy. No evidence for gross hilar lymphadenopathy although assessment is limited by the lack of intravenous contrast on today's study. The esophagus has normal imaging features. There is no axillary lymphadenopathy. Lungs/Pleura: 3 mm anterior left upper lobe pulmonary nodule measured previously is 6 mm on image 34/8 today. A second 3 mm left upper lobe nodule measured on the prior study is 5 mm today (30/8). 5 mm nodule posterior right upper lobe (93/8) was 3 mm previously (remeasured). Tiny 1-2 mm posterior right middle lobe nodule on 90/8 is unchanged. Status post right lower lobectomy with lateral scarring in the right middle lobe, stable. No focal airspace consolidation. No evidence for pulmonary edema. Tiny layering left pleural effusion is new in the interval. Upper Abdomen: 2.6 cm exophytic water density lesion upper pole left kidney is stable, compatible with a cyst. Musculoskeletal: No worrisome lytic or sclerotic osseous abnormality. IMPRESSION: 1. Small bilateral pulmonary nodules (2 left upper lobe and 1 right middle lobe) show minimal but definite interval increase in size since 09/12/2019. Imaging features concerning for metastatic disease. No new pulmonary nodule or mass. 2. Tiny layering left pleural effusion, new in the interval. 3. Aortic Atherosclerosis (ICD10-I70.0). Electronically Signed   By: Misty Stanley M.D.   On: 03/19/2020 10:22   I have independently reviewed the above radiology studies  and reviewed the findings with the patient.    Recent Lab Findings: Lab Results  Component Value Date   WBC 12.5 (H) 02/16/2018   HGB 13.1 02/16/2018   HCT 41.3 02/16/2018   PLT 226 02/16/2018   GLUCOSE 103 (H) 02/16/2018   ALT 22 02/16/2018   AST 24 02/16/2018   NA 139 02/16/2018   K 4.0 02/16/2018   CL 104 02/16/2018    CREATININE 0.94 02/16/2018   BUN 17 02/16/2018   CO2 28 02/16/2018   TSH 1.532 08/01/2012   INR 1.00 02/13/2018      Assessment / Plan:   #1  Previous right lower lobectomy for stage Ib non-small cell carcinoma the lung done 13 months ago-follow-up CT scan suggests increasing bilateral pulmonary nodules suspicious for metastatic disease-we will plan restaging PET scan for the known previously resected non-small cell carcinoma of the lung   Plan to see the patient back immediately after PET scan is done and depending on the results of this develop a strategy for further treatment.      Grace Isaac MD      [address] [place] [address] Office [phone]   Beeper [phone]  03/27/2020 2:34 PM

## 2020-04-09 ENCOUNTER — Ambulatory Visit: Payer: Medicare PPO | Admitting: Cardiothoracic Surgery

## 2020-04-09 ENCOUNTER — Other Ambulatory Visit: Payer: Self-pay

## 2020-04-09 ENCOUNTER — Encounter (HOSPITAL_COMMUNITY)
Admission: RE | Admit: 2020-04-09 | Discharge: 2020-04-09 | Disposition: A | Discharge status: Home / Self Care | Payer: Medicare PPO | Source: Ambulatory Visit | Attending: Cardiothoracic Surgery | Admitting: Cardiothoracic Surgery

## 2020-04-09 VITALS — BP 134/79 | HR 74 | Resp 20 | Ht 64.0 in | Wt 154.0 lb

## 2020-04-09 DIAGNOSIS — C3490 Malignant neoplasm of unspecified part of unspecified bronchus or lung: Secondary | ICD-10-CM | POA: Insufficient documentation

## 2020-04-09 DIAGNOSIS — C3491 Malignant neoplasm of unspecified part of right bronchus or lung: Secondary | ICD-10-CM

## 2020-04-09 LAB — GLUCOSE, CAPILLARY: Glucose-Capillary: 90 mg/dL (ref 70–99)

## 2020-04-09 MED ORDER — FLUDEOXYGLUCOSE F - 18 (FDG) INJECTION
7.5000 | Freq: Once | INTRAVENOUS | Status: AC | PRN
  Administered 2020-04-09: 7.5 via INTRAVENOUS

## 2020-04-09 NOTE — Progress Notes (Signed)
[address]       [place] [address]             [phone]      Sabriyah Williams [facility]ppins [facility] Medical Record #[id number] Date of Birth: 1950/08/28  Referring: Gwenevere Ghazi, MD Primary Care: [name], PA-C Primary Cardiologist: No primary care provider on file.   Chief Complaint:   POST OP FOLLOW UP OPERATIVE REPORT DATE OF PROCEDURE:  02/14/2018 PREOPERATIVE DIAGNOSIS:  Right lower lobe lung mass. POSTOPERATIVE DIAGNOSIS:  Right lower lobe lung mass.  Nonsmall nonsmall-cell lung cancer. PROCEDURE PERFORMED:  Bronchoscopy, right video-assisted thoracoscopy, wedge resection of the right lower lobe, completion right lower lobectomy with lymph node dissection and On-Q placement. SURGEON:  Lanelle Bal, MD  History of Present Illness:     Patient returns to the office today for follow-up visit after resection of a right lower lobe lung mass stage Ib non-small cell carcinoma, this was done approximately 13 months ago.  She has tolerated resection without difficulty.  Patient is a lifelong non-smoker continues to be so.  She notes her husband does smoke heavily. Since surgery she has been followed postoperatively with CT scan of the chest every 6 months.  Follow-up CT scan done last week was suspicious for small pulmonary nodules 2 in the left upper lobe and one in the right To further evaluate this a PET scan was done this morning and the patient returned to the office  Cancer Staging Bronchogenic lung cancer, right lower lobe ; [facility]) Staging form: Lung, AJCC 8th Edition - Pathologic stage from 02/19/2018: Stage IB (pT2a, pN0, cM0) - Signed by Grace Isaac, MD on 02/19/2018     Past Medical History:  Diagnosis Date  . Allergy   . Back pain   . Hypertension   . Hypertension   . Lung mass   . Wears dentures   . Wears glasses      Social History   Tobacco Use  Smoking Status Never Smoker  Smokeless Tobacco Never Used     Social History   Substance and Sexual Activity  Alcohol Use No     Allergies  Allergen Reactions  . Metoprolol Other (See Comments)    Had left shoulder/arm pain and left sided back pain  . Penicillin G Itching    Has patient had a PCN reaction causing immediate rash, facial/tongue/throat swelling, SOB or lightheadedness with hypotension: No Has patient had a PCN reaction causing severe rash involving mucus membranes or skin necrosis: No Has patient had a PCN reaction that required hospitalization: No Has patient had a PCN reaction occurring within the last 10 years: No If all of the above answers are "NO", then may proceed with Cephalosporin use.     Current Outpatient Medications  Medication Sig Dispense Refill  . doxazosin (CARDURA) 2 MG tablet Take 2 mg by mouth daily.    Marland Kitchen latanoprost (XALATAN) 0.005 % ophthalmic solution      No current facility-administered medications for this visit.       Physical Exam: BP 134/79 (BP Location: Right Arm, Patient Position: Sitting)   Pulse 74   Resp 20   Ht 5\' 4"  (1.626 m)   Wt 154 lb (69.9 kg)   SpO2 95% Comment: RA with mask on  BMI 26.43 kg/m  General appearance: alert, cooperative and no distress Lymph nodes: Cervical, supraclavicular, and axillary nodes normal. Cardio: regular rate and rhythm, S1, S2 normal, no murmur, click,  rub or gallop GI: soft, non-tender; bowel sounds normal; no masses,  no organomegaly Extremities: extremities normal, atraumatic, no cyanosis or edema and Homans sign is negative, no sign of DVT Neurologic: Grossly normal   Diagnostic Studies & Laboratory data:     Recent Radiology Findings:   NM PET Image Restag (PS) Skull Base To Thigh  Result Date: 04/09/2020 CLINICAL DATA:  Subsequent treatment strategy for non-small cell lung cancer. History of right lower lobe lobectomy. Enlarging pulmonary nodules. Evaluate for metastatic disease. EXAM: NUCLEAR MEDICINE PET SKULL BASE TO THIGH  TECHNIQUE: 7.5 mCi F-18 FDG was injected intravenously. Full-ring PET imaging was performed from the skull base to thigh after the radiotracer. CT data was obtained and used for attenuation correction and anatomic localization. Fasting blood glucose: 90 mg/dl COMPARISON:  Prior CT scans 03/07/2019, 09/12/2019 and 03/19/2020. FINDINGS: Mediastinal blood pool activity: SUV max 2.18 Liver activity: SUV max NA NECK: No hypermetabolic lymph nodes in the neck. Incidental CT findings: none CHEST: 2 left upper lobe pulmonary nodules and 1 right lower lobe pulmonary nodule are again demonstrated. These are below the limits of resolution for accurate PET-CT characterization. However, the 4 mm left upper lobe nodule slightly more anterior and inferior compared to the adjacent nodule appears to have slight hypermetabolism with SUV max of 1.91. Given its small size is is worrisome. I do not see any definite uptake in the adjacent nodule or in the right lower lobe nodule. There is a small focus of hypermetabolism noted in the right lower mediastinum adjacent to the right lower lobe bronchus appears difficult to identify lymph node in this area without contrast but the SUV max is 7.51 and certainly suspicious. A contrast enhanced CT scan may be helpful for further evaluation. No breast masses, supraclavicular or axillary adenopathy. Incidental CT findings: none ABDOMEN/PELVIS: No abnormal hypermetabolic activity within the liver, pancreas, adrenal glands, or spleen. No hypermetabolic lymph nodes in the abdomen or pelvis. Incidental CT findings: Periumbilical abdominal wall hernia containing fat is noted. Scattered vascular calcifications. Retroverted uterus with small fibroids. Large left-sided sacral Tarlov cyst. SKELETON: No focal hypermetabolic activity to suggest skeletal metastasis. Incidental CT findings: none IMPRESSION: 1. Three pulmonary nodules are below the limits of resolution for PET-CT. However, a left upper lobe  nodule measuring 4 mm appears to be mildly hypermetabolic and is suspicious for a metastatic focus. 2. Focus of hypermetabolism in the right lower mediastinum is suspicious for a metastatic lymph node although difficult to see on the CT scan. 3. No other areas of metastatic disease are identified. Electronically Signed   By: Marijo Sanes M.D.   On: 04/09/2020 14:30   CT CHEST WO CONTRAST  Result Date: 03/19/2020 CLINICAL DATA:  Right lung cancer, status post VATS.  Restaging. EXAM: CT CHEST WITHOUT CONTRAST TECHNIQUE: Multidetector CT imaging of the chest was performed following the standard protocol without IV contrast. COMPARISON:  09/12/2019 FINDINGS: Cardiovascular: Heart size upper normal. Trace pericardial effusion noted. Atherosclerotic calcification is noted in the wall of the thoracic aorta. Mediastinum/Nodes: No mediastinal lymphadenopathy. No evidence for gross hilar lymphadenopathy although assessment is limited by the lack of intravenous contrast on today's study. The esophagus has normal imaging features. There is no axillary lymphadenopathy. Lungs/Pleura: 3 mm anterior left upper lobe pulmonary nodule measured previously is 6 mm on image 34/8 today. A second 3 mm left upper lobe nodule measured on the prior study is 5 mm today (30/8). 5 mm nodule posterior right upper lobe (93/8) was 3 mm  previously (remeasured). Tiny 1-2 mm posterior right middle lobe nodule on 90/8 is unchanged. Status post right lower lobectomy with lateral scarring in the right middle lobe, stable. No focal airspace consolidation. No evidence for pulmonary edema. Tiny layering left pleural effusion is new in the interval. Upper Abdomen: 2.6 cm exophytic water density lesion upper pole left kidney is stable, compatible with a cyst. Musculoskeletal: No worrisome lytic or sclerotic osseous abnormality. IMPRESSION: 1. Small bilateral pulmonary nodules (2 left upper lobe and 1 right middle lobe) show minimal but definite interval  increase in size since 09/12/2019. Imaging features concerning for metastatic disease. No new pulmonary nodule or mass. 2. Tiny layering left pleural effusion, new in the interval. 3. Aortic Atherosclerosis (ICD10-I70.0). Electronically Signed   By: Misty Stanley M.D.   On: 03/19/2020 10:22   I have independently reviewed the above radiology studies  and reviewed the findings with the patient.    Recent Lab Findings: Lab Results  Component Value Date   WBC 12.5 (H) 02/16/2018   HGB 13.1 02/16/2018   HCT 41.3 02/16/2018   PLT 226 02/16/2018   GLUCOSE 103 (H) 02/16/2018   ALT 22 02/16/2018   AST 24 02/16/2018   NA 139 02/16/2018   K 4.0 02/16/2018   CL 104 02/16/2018   CREATININE 0.94 02/16/2018   BUN 17 02/16/2018   CO2 28 02/16/2018   TSH 1.532 08/01/2012   INR 1.00 02/13/2018      Assessment / Plan:   #1  Previous right lower lobectomy for stage Ib non-small cell carcinoma the lung done 13 months ago-follow-up CT scan suggests increasing bilateral pulmonary nodules suspicious for metastatic disease-pulmonary nodules are small and would be difficult to directly biopsy.  PET scan shows some mild activity in the right lower mediastinum without definite CT findings of enlarged nodes.  We will plan to review the findings at the multidisciplinary thoracic oncology conference and with medical oncology.  She will need MRI of the brain.  She is a lifelong non-smoker, send a previous biopsy specimens for molecular testing.  We will have the patient see medical oncology as we evaluate the best biopsy strategy.       Grace Isaac MD      [address] [place] [address] Office [phone]   Beeper [phone]  04/13/2020 1:23 PM

## 2020-04-13 ENCOUNTER — Telehealth: Payer: Self-pay | Admitting: Cardiothoracic Surgery

## 2020-04-13 ENCOUNTER — Other Ambulatory Visit: Payer: Self-pay | Admitting: *Deleted

## 2020-04-13 DIAGNOSIS — G4489 Other headache syndrome: Secondary | ICD-10-CM

## 2020-04-13 DIAGNOSIS — C3491 Malignant neoplasm of unspecified part of right bronchus or lung: Secondary | ICD-10-CM

## 2020-04-13 NOTE — Telephone Encounter (Signed)
      [address]       [place] [address]             [phone]    Called and further discussed phone report on the PET scan with the patient. With the concern of possible recurrent disease we will have the patient be seen by Dr. Darl Pikes oncology Also will obtain MRI of the brain to rule out metastasis After the scan will further evaluate for the best way to obtain repeat biopsy Previous resection specimen has been sent for molecular testing Patient is aware that radiology will call for scheduling a MRI of the brain and also Dr. Worthy Flank office will call for an appointment

## 2020-04-14 ENCOUNTER — Telehealth: Payer: Self-pay | Admitting: *Deleted

## 2020-04-14 DIAGNOSIS — C3491 Malignant neoplasm of unspecified part of right bronchus or lung: Secondary | ICD-10-CM

## 2020-04-14 NOTE — Telephone Encounter (Signed)
I updated Dr. Julien Nordmann on referral.  He would like to see her mid Dec due to more tissue testing.  I called and spoke with Mr. Runyan and updated her on appt.  She verbalized understanding of appt time and place.

## 2020-04-23 ENCOUNTER — Encounter: Payer: Self-pay | Admitting: *Deleted

## 2020-04-23 NOTE — Progress Notes (Signed)
On 04/13/20 per Dr. Julien Nordmann, I notified pathology team to send molecular testing and PDL 1 from tissue 02/14/2018.  I checked the [facility] portal but did not see that the tissue made it there. I will follow up with pathology team for an update.

## 2020-05-01 ENCOUNTER — Other Ambulatory Visit: Payer: Self-pay

## 2020-05-01 ENCOUNTER — Ambulatory Visit
Admission: RE | Admit: 2020-05-01 | Discharge: 2020-05-01 | Disposition: A | Discharge status: Home / Self Care | Payer: Medicare PPO | Source: Ambulatory Visit | Attending: Cardiothoracic Surgery | Admitting: Cardiothoracic Surgery

## 2020-05-01 DIAGNOSIS — G4489 Other headache syndrome: Secondary | ICD-10-CM

## 2020-05-01 DIAGNOSIS — C3491 Malignant neoplasm of unspecified part of right bronchus or lung: Secondary | ICD-10-CM

## 2020-05-01 MED ORDER — GADOBENATE DIMEGLUMINE 529 MG/ML IV SOLN
14.0000 mL | Freq: Once | INTRAVENOUS | Status: AC | PRN
  Administered 2020-05-01: 14 mL via INTRAVENOUS

## 2020-05-04 ENCOUNTER — Encounter (HOSPITAL_COMMUNITY): Payer: Self-pay | Admitting: Internal Medicine

## 2020-05-05 ENCOUNTER — Encounter: Payer: Self-pay | Admitting: Internal Medicine

## 2020-05-05 ENCOUNTER — Inpatient Hospital Stay: Payer: Medicare PPO

## 2020-05-05 ENCOUNTER — Other Ambulatory Visit: Payer: Self-pay

## 2020-05-05 ENCOUNTER — Inpatient Hospital Stay: Payer: Medicare PPO | Attending: Internal Medicine | Admitting: Internal Medicine

## 2020-05-05 VITALS — BP 154/91 | HR 64 | Temp 97.5°F | Resp 18 | Ht 64.0 in | Wt 153.1 lb

## 2020-05-05 DIAGNOSIS — C3491 Malignant neoplasm of unspecified part of right bronchus or lung: Secondary | ICD-10-CM | POA: Diagnosis not present

## 2020-05-05 DIAGNOSIS — C3431 Malignant neoplasm of lower lobe, right bronchus or lung: Secondary | ICD-10-CM | POA: Diagnosis not present

## 2020-05-05 LAB — CMP (CANCER CENTER ONLY)
ALT: 21 U/L (ref 0–44)
AST: 21 U/L (ref 15–41)
Albumin: 3.7 g/dL (ref 3.5–5.0)
Alkaline Phosphatase: 71 U/L (ref 38–126)
Anion gap: 6 (ref 5–15)
BUN: 16 mg/dL (ref 8–23)
CO2: 29 mmol/L (ref 22–32)
Calcium: 9.1 mg/dL (ref 8.9–10.3)
Chloride: 107 mmol/L (ref 98–111)
Creatinine: 0.99 mg/dL (ref 0.44–1.00)
GFR, Estimated: >60 mL/min (ref >60)
Glucose, Bld: 85 mg/dL (ref 70–99)
Potassium: 3.8 mmol/L (ref 3.5–5.1)
Sodium: 142 mmol/L (ref 135–145)
Total Bilirubin: 0.9 mg/dL (ref 0.3–1.2)
Total Protein: 6.6 g/dL (ref 6.5–8.1)

## 2020-05-05 LAB — CBC WITH DIFFERENTIAL (CANCER CENTER ONLY)
Abs Immature Granulocytes: 0.01 10*3/uL (ref 0.00–0.07)
Basophils Absolute: 0 10*3/uL (ref 0.0–0.1)
Basophils Relative: 1 %
Eosinophils Absolute: 0.2 10*3/uL (ref 0.0–0.5)
Eosinophils Relative: 4 %
HCT: 38.9 % (ref 36.0–46.0)
Hemoglobin: 12.7 g/dL (ref 12.0–15.0)
Immature Granulocytes: 0 %
Lymphocytes Relative: 40 %
Lymphs Abs: 1.7 10*3/uL (ref 0.7–4.0)
MCH: 29.5 pg (ref 26.0–34.0)
MCHC: 32.6 g/dL (ref 30.0–36.0)
MCV: 90.3 fL (ref 80.0–100.0)
Monocytes Absolute: 0.4 10*3/uL (ref 0.1–1.0)
Monocytes Relative: 8 %
Neutro Abs: 2.1 10*3/uL (ref 1.7–7.7)
Neutrophils Relative %: 47 %
Platelet Count: 220 10*3/uL (ref 150–400)
RBC: 4.31 MIL/uL (ref 3.87–5.11)
RDW: 13.4 % (ref 11.5–15.5)
WBC Count: 4.3 10*3/uL (ref 4.0–10.5)
nRBC: 0 % (ref 0.0–0.2)

## 2020-05-05 NOTE — Progress Notes (Signed)
[place] [phone]   Fax:([phone]  CONSULT NOTE  REFERRING PHYSICIAN: Dr. Lanelle Bal  REASON FOR CONSULTATION:  69 years old African-American female diagnosed with lung cancer.  HPI Shelby Green is a 69 y.o. female with past medical history significant for hypertension, allergy, back pain as well as history of a stage IB (T2a, N0, M0) non-small cell lung cancer diagnosed in September 2019 status post right lower lobectomy with lymph node dissection.  The patient has been in observation for the last 2 years with repeat imaging studies by Dr. Servando Snare.  Repeat CT scan of the chest on March 19, 2020 showed 3 mm anterior left upper lobe pulmonary nodule.  There was also a second 3 mm left upper lobe nodule and a 5 mm nodule in the posterior right upper lobe that increased in size compared to the previous imaging studies.  The patient had a PET scan on April 09, 2020 that showed these 3 pulmonary nodules were below the limits of resolution for the PET-CT.  A left upper lobe nodule measuring 4 mm appears to be mildly hypermetabolic and suspicious for a metastatic focus.  There was also a focus of hypermetabolism in the right lower mediastinum suspicious for a metastatic lymph node but difficult to see on the CT scan.  No other areas of metastatic disease identified.  MRI of the brain performed on May 01, 2020 was negative for metastatic disease to the brain. Dr. Servando Snare kindly referred the patient to me today for evaluation and recommendation regarding her condition. When seen today she is feeling fine except for shortness of breath with exertion but no significant chest pain, cough or hemoptysis.  She has no significant weight loss or night sweats.  She has no nausea, vomiting, diarrhea or constipation.  She has no headache or visual changes. Family history significant for mother with hypertension, father died in an accident.  Brother had  heart disease and 2 aunts with lung and stomach cancer. The patient is married and has 1 son.  She used to work as a Theatre manager and currently working in [employer].  She has no history of smoking, alcohol or drug abuse.  HPI  Past Medical History:  Diagnosis Date  . Allergy   . Back pain   . Hypertension   . Hypertension   . Lung mass   . Wears dentures   . Wears glasses     Past Surgical History:  Procedure Laterality Date  . COLONOSCOPY W/ BIOPSIES AND POLYPECTOMY    . MULTIPLE TOOTH EXTRACTIONS    . VIDEO ASSISTED THORACOSCOPY (VATS)/WEDGE RESECTION Right 02/14/2018   Procedure: RIGHT  VIDEO ASSISTED THORACOSCOPY (VATS) WITH RIGHT LOWER LOBE (RLL) WEDGE RESECTION, COMPLETION RLL LOBECTOMY, LYMPH NODE DISSECTION, ON-Q PLACEMENT;  Surgeon: Grace Isaac, MD;  Location: [facility];  Service: Thoracic;  Laterality: Right;  Marland Kitchen VIDEO BRONCHOSCOPY N/A 02/14/2018   Procedure: VIDEO BRONCHOSCOPY;  Surgeon: Grace Isaac, MD;  Location: [facility] OR;  Service: Thoracic;  Laterality: N/A;    Family History  Problem Relation Age of Onset  . Arthritis Mother   . Hypertension Mother   . Stroke Mother   . Hypertension Sister     Social History Social History   Tobacco Use  . Smoking status: Never Smoker  . Smokeless tobacco: Never Used  Substance Use Topics  . Alcohol use: No  . Drug use: No    Allergies  Allergen Reactions  . Metoprolol  Other (See Comments)    Had left shoulder/arm pain and left sided back pain  . Penicillin G Itching    Has patient had a PCN reaction causing immediate rash, facial/tongue/throat swelling, SOB or lightheadedness with hypotension: No Has patient had a PCN reaction causing severe rash involving mucus membranes or skin necrosis: No Has patient had a PCN reaction that required hospitalization: No Has patient had a PCN reaction occurring within the last 10 years: No If all of the above answers are "NO", then may proceed with Cephalosporin  use.     Current Outpatient Medications  Medication Sig Dispense Refill  . doxazosin (CARDURA) 2 MG tablet Take 2 mg by mouth daily.    . latanoprost (XALATAN) 0.005 % ophthalmic solution      No current facility-administered medications for this visit.    Review of Systems  Constitutional: negative Eyes: negative Ears, nose, mouth, throat, and face: negative Respiratory: positive for dyspnea on exertion Cardiovascular: negative Gastrointestinal: negative Genitourinary:negative Integument/breast: negative Hematologic/lymphatic: negative Musculoskeletal:negative Neurological: negative Behavioral/Psych: negative Endocrine: negative Allergic/Immunologic: negative  Physical Exam  RAL:alert, healthy, no distress, well nourished and well developed SKIN: skin color, texture, turgor are normal, no rashes or significant lesions HEAD: Normocephalic, No masses, lesions, tenderness or abnormalities EYES: normal, PERRLA, Conjunctiva are pink and non-injected EARS: External ears normal, Canals clear OROPHARYNX:no exudate, no erythema and lips, buccal mucosa, and tongue normal  NECK: supple, no adenopathy, no JVD LYMPH:  no palpable lymphadenopathy, no hepatosplenomegaly BREAST:not examined LUNGS: clear to auscultation , and palpation HEART: regular rate & rhythm, no murmurs and no gallops ABDOMEN:abdomen soft, non-tender, normal bowel sounds and no masses or organomegaly BACK: No CVA tenderness, Range of motion is normal EXTREMITIES:no joint deformities, effusion, or inflammation, no edema  NEURO: alert & oriented x 3 with fluent speech, no focal motor/sensory deficits  PERFORMANCE STATUS: ECOG 1  LABORATORY DATA: Lab Results  Component Value Date   WBC 4.3 05/05/2020   HGB 12.7 05/05/2020   HCT 38.9 05/05/2020   MCV 90.3 05/05/2020   PLT 220 05/05/2020      Chemistry      Component Value Date/Time   NA 142 05/05/2020 1352   K 3.8 05/05/2020 1352   CL 107 05/05/2020  1352   CO2 29 05/05/2020 1352   BUN 16 05/05/2020 1352   CREATININE 0.99 05/05/2020 1352   CREATININE 0.83 08/06/2015 1616      Component Value Date/Time   CALCIUM 9.1 05/05/2020 1352   ALKPHOS 71 05/05/2020 1352   AST 21 05/05/2020 1352   ALT 21 05/05/2020 1352   BILITOT 0.9 05/05/2020 1352       RADIOGRAPHIC STUDIES: MR Brain W Wo Contrast  Result Date: 05/01/2020 CLINICAL DATA:  Lung cancer. EXAM: MRI HEAD WITHOUT AND WITH CONTRAST TECHNIQUE: Multiplanar, multiecho pulse sequences of the brain and surrounding structures were obtained without and with intravenous contrast. CONTRAST:  14mL MULTIHANCE GADOBENATE DIMEGLUMINE 529 MG/ML IV SOLN COMPARISON:  None. FINDINGS: Brain: There is no evidence of an acute infarct, intracranial hemorrhage, mass, midline shift, or extra-axial fluid collection. The ventricles and sulci are normal. Scattered small foci of T2 hyperintensity in the cerebral white matter bilaterally are nonspecific but compatible with minimal chronic small vessel ischemic disease. A developmental venous anomaly is incidentally noted in the left cerebellar hemisphere. No abnormal brain parenchymal or meningeal enhancement is seen to suggest metastatic disease. Vascular: Major intracranial vascular flow voids are preserved. Skull and upper cervical spine: Unremarkable bone marrow signal.   Sinuses/Orbits: Unremarkable orbits. Paranasal sinuses and mastoid air cells are clear. Other: None. IMPRESSION: 1. No evidence of intracranial metastases. 2. Minimal chronic small vessel ischemic disease. Electronically Signed   By: Allen  Grady M.D.   On: 05/01/2020 17:57   NM PET Image Restag (PS) Skull Base To Thigh  Result Date: 04/09/2020 CLINICAL DATA:  Subsequent treatment strategy for non-small cell lung cancer. History of right lower lobe lobectomy. Enlarging pulmonary nodules. Evaluate for metastatic disease. EXAM: NUCLEAR MEDICINE PET SKULL BASE TO THIGH TECHNIQUE: 7.5 mCi F-18 FDG  was injected intravenously. Full-ring PET imaging was performed from the skull base to thigh after the radiotracer. CT data was obtained and used for attenuation correction and anatomic localization. Fasting blood glucose: 90 mg/dl COMPARISON:  Prior CT scans 03/07/2019, 09/12/2019 and 03/19/2020. FINDINGS: Mediastinal blood pool activity: SUV max 2.18 Liver activity: SUV max NA NECK: No hypermetabolic lymph nodes in the neck. Incidental CT findings: none CHEST: 2 left upper lobe pulmonary nodules and 1 right lower lobe pulmonary nodule are again demonstrated. These are below the limits of resolution for accurate PET-CT characterization. However, the 4 mm left upper lobe nodule slightly more anterior and inferior compared to the adjacent nodule appears to have slight hypermetabolism with SUV max of 1.91. Given its small size is is worrisome. I do not see any definite uptake in the adjacent nodule or in the right lower lobe nodule. There is a small focus of hypermetabolism noted in the right lower mediastinum adjacent to the right lower lobe bronchus appears difficult to identify lymph node in this area without contrast but the SUV max is 4.22 and certainly suspicious. A contrast enhanced CT scan may be helpful for further evaluation. No breast masses, supraclavicular or axillary adenopathy. Incidental CT findings: none ABDOMEN/PELVIS: No abnormal hypermetabolic activity within the liver, pancreas, adrenal glands, or spleen. No hypermetabolic lymph nodes in the abdomen or pelvis. Incidental CT findings: Periumbilical abdominal wall hernia containing fat is noted. Scattered vascular calcifications. Retroverted uterus with small fibroids. Large left-sided sacral Tarlov cyst. SKELETON: No focal hypermetabolic activity to suggest skeletal metastasis. Incidental CT findings: none IMPRESSION: 1. Three pulmonary nodules are below the limits of resolution for PET-CT. However, a left upper lobe nodule measuring 4 mm appears  to be mildly hypermetabolic and is suspicious for a metastatic focus. 2. Focus of hypermetabolism in the right lower mediastinum is suspicious for a metastatic lymph node although difficult to see on the CT scan. 3. No other areas of metastatic disease are identified. Electronically Signed   By: P.  Gallerani M.D.   On: 04/09/2020 14:30    ASSESSMENT: This is a very pleasant 68 years old never smoker African-American female with history of a stage Ib (T2 a, N0, M0) non-small cell lung cancer status post right lower lobectomy with lymph node dissection on February 10, 2018 under the care of Dr. Gerhardt. The patient presented recently with bilateral pulmonary nodules including 2 nodules in the left upper lobe and 1 nodule in the right middle lobe suspicious for metastatic disease.   PLAN: I had a lengthy discussion with the patient and her friend today about her current disease condition and further work-up and investigation as well as potential treatment options.  I recommended for the patient to send her tissue block for molecular studies and PD-L1 expression.  Her PD-L1 expression was reported to be 1% but the molecular studies by [facility] are still pending. If the molecular study showed an actionable mutation, the patient   may benefit from treatment with targeted therapy with close monitoring of her disease. If the patient has no actionable mutations, I would recommend for her to continue close monitoring and observation with repeat CT scan of the chest in 3 months for restaging of her disease and consideration of SBRT to the pulmonary nodules if continues to increase in size after consideration of repeat biopsy.  She would also be considered for systemic therapy if she developed more nodules or evidence of metastatic disease. The patient agreed to the current plan. We will call her after the availability of the molecular studies with more recommendation and follow-up visit as needed. She was  advised to call immediately if she has any concerning symptoms in the interval.  The patient voices understanding of current disease status and treatment options and is in agreement with the current care plan.  All questions were answered. The patient knows to call the clinic with any problems, questions or concerns. We can certainly see the patient much sooner if necessary.  Thank you so much for allowing me to participate in the care of [place]. I will continue to follow up the patient with you and assist in her care. The total time spent in the appointment was 70 minutes.  Disclaimer: This note was dictated with voice recognition software. Similar sounding words can inadvertently be transcribed and may not be corrected upon review.   Eilleen Kempf May 05, 2020, 2:52 PM

## 2020-05-06 ENCOUNTER — Encounter: Payer: Self-pay | Admitting: *Deleted

## 2020-05-06 NOTE — Progress Notes (Signed)
Oncology Nurse Navigator Documentation  Oncology Nurse Navigator Flowsheets 05/06/2020  Abnormal Finding Date 03/19/2020  Diagnosis Status Recurrent  Planned Course of Treatment Observation;Targeted Therapy  Navigator Follow Up Date: 05/12/2020  Navigator Follow Up Reason: Molecular Testing  Navigator Location [facility]  Navigator Encounter Type Other:  Patient Visit Type Other  Treatment Phase Abnormal Scans  Interventions Coordination of Care  Acuity Level 2-Minimal Needs (1-2 Barriers Identified)  Coordination of Care Pathology;Other  Time Spent with Patient 30

## 2020-05-08 ENCOUNTER — Encounter: Payer: Self-pay | Admitting: *Deleted

## 2020-05-08 NOTE — Progress Notes (Signed)
I followed up on Shelby Green's moleculars.  I printed and placed on Dr. Worthy Flank desk.

## 2020-05-11 ENCOUNTER — Encounter: Payer: Self-pay | Admitting: *Deleted

## 2020-05-11 ENCOUNTER — Encounter (HOSPITAL_COMMUNITY): Payer: Self-pay | Admitting: Internal Medicine

## 2020-05-11 NOTE — Progress Notes (Signed)
I updated Dr. Julien Nordmann on Mr. Shelby Green molecular results.  He will see her in 3 months with follow up scan to discuss possible treatment options.

## 2020-05-14 ENCOUNTER — Encounter (HOSPITAL_COMMUNITY): Payer: Self-pay

## 2020-05-27 ENCOUNTER — Telehealth: Payer: Self-pay | Admitting: Medical Oncology

## 2020-05-27 ENCOUNTER — Telehealth: Payer: Self-pay | Admitting: Internal Medicine

## 2020-05-27 NOTE — Telephone Encounter (Signed)
Scheduled follow-up appointment per 1/5 schedule message. Patient is aware.

## 2020-05-27 NOTE — Telephone Encounter (Signed)
Pt asking for molecular results.Message sent to [facility].

## 2020-06-02 ENCOUNTER — Other Ambulatory Visit: Payer: Self-pay

## 2020-06-02 ENCOUNTER — Telehealth: Payer: Self-pay | Admitting: Internal Medicine

## 2020-06-02 ENCOUNTER — Inpatient Hospital Stay: Payer: Medicare HMO | Attending: Internal Medicine | Admitting: Internal Medicine

## 2020-06-02 VITALS — BP 165/96 | HR 82 | Temp 97.2°F | Resp 15 | Ht 64.0 in | Wt 149.7 lb

## 2020-06-02 DIAGNOSIS — I10 Essential (primary) hypertension: Secondary | ICD-10-CM | POA: Insufficient documentation

## 2020-06-02 DIAGNOSIS — C3490 Malignant neoplasm of unspecified part of unspecified bronchus or lung: Secondary | ICD-10-CM

## 2020-06-02 DIAGNOSIS — Z85118 Personal history of other malignant neoplasm of bronchus and lung: Secondary | ICD-10-CM | POA: Insufficient documentation

## 2020-06-02 DIAGNOSIS — Z5111 Encounter for antineoplastic chemotherapy: Secondary | ICD-10-CM

## 2020-06-02 DIAGNOSIS — Z7189 Other specified counseling: Secondary | ICD-10-CM | POA: Insufficient documentation

## 2020-06-02 NOTE — Telephone Encounter (Signed)
Scheduled appointments per 1/11 los. Spoke to patient who is aware of appointments dates and times.

## 2020-06-02 NOTE — Progress Notes (Signed)
[place] Telephone:([phone]   Fax:(336) [phone]  OFFICE PROGRESS NOTE  [name], PA-C [address] [place] [address]  DIAGNOSIS: Recurrent non-small cell lung cancer initially diagnosed as stage Ib (T2 a, N0, M0) non-small cell lung cancer, adenocarcinoma in September 2019.  The patient had bilateral pulmonary nodules including 2 nodules in the left upper lobe and 1 nodule in the right middle lobe in December 2021. Biomarker Findings Microsatellite status - MS-Stable Tumor Mutational Burden - 4 Muts/Mb Genomic Findings For a complete list of the genes assayed, please refer to the Appendix. 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 7 Disease relevant genes with no reportable alterations: ALK, BRAF, ERBB2, KRAS, MET, RET, ROS1  PDL1 Expression: 1%.  PRIOR THERAPY: Status post right lower lobectomy with lymph node dissection on February 10, 2018 under the care of Dr. Servando Snare.  CURRENT THERAPY: None.  INTERVAL HISTORY: Shelby Green 70 y.o. female returns to the clinic today for follow-up visit accompanied by friend.  The patient is feeling fine today with no concerning complaints except for anxiety.  She denied having any current chest pain, shortness of breath, cough or hemoptysis.  She denied having any fever or chills.  She has no nausea, vomiting, diarrhea or constipation.  She denied having any headache or visual changes.  She has no recent weight loss or night sweats.  She had molecular studies performed by [facility] and she is here today for evaluation and discussion of her molecular studies and treatment options.  MEDICAL HISTORY: Past Medical History:  Diagnosis Date  . Allergy   . Back pain   . Hypertension   . Hypertension   . Lung mass   . Wears dentures   . Wears glasses     ALLERGIES:  is allergic to metoprolol and penicillin g.  MEDICATIONS:  Current Outpatient  Medications  Medication Sig Dispense Refill  . doxazosin (CARDURA) 2 MG tablet Take 2 mg by mouth daily.    Marland Kitchen latanoprost (XALATAN) 0.005 % ophthalmic solution      No current facility-administered medications for this visit.    SURGICAL HISTORY:  Past Surgical History:  Procedure Laterality Date  . COLONOSCOPY W/ BIOPSIES AND POLYPECTOMY    . MULTIPLE TOOTH EXTRACTIONS    . VIDEO ASSISTED THORACOSCOPY (VATS)/WEDGE RESECTION Right 02/14/2018   Procedure: RIGHT  VIDEO ASSISTED THORACOSCOPY (VATS) WITH RIGHT LOWER LOBE (RLL) WEDGE RESECTION, COMPLETION RLL LOBECTOMY, LYMPH NODE DISSECTION, ON-Q PLACEMENT;  Surgeon: Grace Isaac, MD;  Location: [place];  Service: Thoracic;  Laterality: Right;  Marland Kitchen VIDEO BRONCHOSCOPY N/A 02/14/2018   Procedure: VIDEO BRONCHOSCOPY;  Surgeon: Grace Isaac, MD;  Location: [facility] OR;  Service: Thoracic;  Laterality: N/A;    REVIEW OF SYSTEMS:  Constitutional: negative Eyes: negative Ears, nose, mouth, throat, and face: negative Respiratory: negative Cardiovascular: negative Gastrointestinal: negative Genitourinary:negative Integument/breast: negative Hematologic/lymphatic: negative Musculoskeletal:negative Neurological: negative Behavioral/Psych: positive for anxiety Endocrine: negative Allergic/Immunologic: negative   PHYSICAL EXAMINATION: General appearance: alert, cooperative and no distress Head: Normocephalic, without obvious abnormality, atraumatic Neck: no adenopathy, no JVD, supple, symmetrical, trachea midline and thyroid not enlarged, symmetric, no tenderness/mass/nodules Lymph nodes: Cervical, supraclavicular, and axillary nodes normal. Resp: clear to auscultation bilaterally Back: symmetric, no curvature. ROM normal. No CVA tenderness. Cardio: regular rate and rhythm, S1, S2 normal, no murmur, click, rub or gallop GI: soft, non-tender; bowel sounds normal; no masses,  no organomegaly Extremities: extremities normal,  atraumatic, no  cyanosis or edema Neurologic: Alert and oriented X 3, normal strength and tone. Normal symmetric reflexes. Normal coordination and gait  ECOG PERFORMANCE STATUS: 1 - Symptomatic but completely ambulatory  Blood pressure (!) 165/96, pulse 82, temperature (!) 97.2 F (36.2 C), temperature source Tympanic, resp. rate 15, height '5\' 4"'  (1.626 m), weight 149 lb 11.2 oz (67.9 kg), SpO2 100 %.  LABORATORY DATA: Lab Results  Component Value Date   WBC 4.3 05/05/2020   HGB 12.7 05/05/2020   HCT 38.9 05/05/2020   MCV 90.3 05/05/2020   PLT 220 05/05/2020      Chemistry      Component Value Date/Time   NA 142 05/05/2020 1352   K 3.8 05/05/2020 1352   CL 107 05/05/2020 1352   CO2 29 05/05/2020 1352   BUN 16 05/05/2020 1352   CREATININE 0.99 05/05/2020 1352   CREATININE 0.83 08/06/2015 1616      Component Value Date/Time   CALCIUM 9.1 05/05/2020 1352   ALKPHOS 71 05/05/2020 1352   AST 21 05/05/2020 1352   ALT 21 05/05/2020 1352   BILITOT 0.9 05/05/2020 1352       RADIOGRAPHIC STUDIES: No results found.  ASSESSMENT AND PLAN: This is a very pleasant 70 years old African-American female with highly suspicious recurrent non-small cell lung cancer, initially diagnosed as a stage Ib (T2 a, N0, M0) non-small cell lung cancer, adenocarcinoma status post right lower lobectomy with lymph node dissection in September 2019 under the care of Dr. Servando Snare.  The patient had evidence for disease recurrence in December 2021 presenting with 2 pulmonary nodules in the left upper lobe as well as 1 nodule in the right middle lobe. 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  Her PD-L1 expression is 1%. I had a lengthy discussion with the patient and her friend today about her current condition and treatment options. I discussed with the patient few options for management of her condition including continuous observation and close monitoring of the  pulmonary nodule for any further progression or development of other nodules.  I also discussed with her proceeding with treatment with targeted therapy with Tagrisso 80 mg p.o. daily especially with the high suspicion of the current pulmonary nodules.  The patient would like to wait a little bit longer before starting the treatment. I will see her back for follow-up visit in 3 months for evaluation with repeat CT scan of the chest for restaging of her disease. For the hypertension she did not take her blood pressure medication earlier today.  I strongly advised the patient to take her blood pressure medication as prescribed and to monitor it closely at home. She was advised to call immediately if she has any other concerning symptoms in the interval. The patient voices understanding of current disease status and treatment options and is in agreement with the current care plan.  All questions were answered. The patient knows to call the clinic with any problems, questions or concerns. We can certainly see the patient much sooner if necessary.  The total time spent in the appointment was 30 minutes.  Disclaimer: This note was dictated with voice recognition software. Similar sounding words can inadvertently be transcribed and may not be corrected upon review.

## 2020-06-02 NOTE — Patient Instructions (Signed)
Osimertinib oral tablets What is this medicine? Osimertinib (OH sim ER ti nib) is a medicine that targets proteins in cancer cells and stops the cancer cells from growing. It is used to treat non-small cell lung cancer. This medicine may be used for other purposes; ask your health care provider or pharmacist if you have questions. COMMON BRAND NAME(S): Tagrisso What should I tell my health care provider before I take this medicine? They need to know if you have any of these conditions:  heart disease  history of irregular heartbeat  history of low levels of calcium, magnesium, or potassium in the blood  lung or breathing disease, like asthma  QT prolongation  scarring or thickening of the lungs  an unusual or allergic reaction to osimertinib, other medicines, foods, dyes, or preservatives  pregnant or trying to get pregnant  breast-feeding How should I use this medicine? Take osimertinib tablets by mouth with a glass of water. You can take it with or without food. Follow the directions on the prescription label. Take your medicine at regular intervals. Do not take it more often than directed. Do not stop taking except on your doctor's advice. If swallowing is difficult, you can take the tablet by placing your dose in a container with 2 ounces of cool water only. Stir until the tablet is in small pieces. The tablet will not completely dissolve. Do not crush or chew. Drink the water and the tablet pieces right away. Then add 4 to 8 ounces of water to the same container and drink to make sure you take your full dose. Talk to your pediatrician regarding the use of this medicine in children. Special care may be needed. Overdosage: If you think you have taken too much of this medicine contact a poison control center or emergency room at once. NOTE: This medicine is only for you. Do not share this medicine with others. What if I miss a dose? If you miss a dose, do not make up for the missed  dose. Take your next dose at your regular time. Do not take extra or double doses. What may interact with this medicine? Do not take this medicine with any of the following medications:  cisapride  dronedarone  pimozide  thioridazine This medicine may interact with the following medications:  certain medicines for seizures like carbamazepine, phenobarbital, phenytoin  other medicines that prolong the QT interval (cause an abnormal heart rhythm) like dofetilide, ziprasidone  rifampin  rosuvastatin  St. John's Wort  sulfasalazine  topotecan This list may not describe all possible interactions. Give your health care provider a list of all the medicines, herbs, non-prescription drugs, or dietary supplements you use. Also tell them if you smoke, drink alcohol, or use illegal drugs. Some items may interact with your medicine. What should I watch for while using this medicine? Visit your doctor for regular check ups. Report any side effects. Continue your course of treatment unless your doctor tells you to stop. You will need blood work done while you are taking this medicine. Do not become pregnant while taking this medicine or for 6 weeks after the last dose. Males with female partners of reproductive potential should use effective contraception for 4 months after the last dose. Women should inform their doctor if they wish to become pregnant or think they might be pregnant. There is a potential for serious side effects to an unborn child. This medicine may interfere with the ability to have a child for both men and women.  You should talk with your doctor or health care professional if you are concerned about your fertility. Talk to your health care professional or pharmacist for more information. Do not breast-feed an infant while taking this medicine or for 2 weeks after the last dose. This drug may make you feel generally unwell. This is not uncommon, as chemotherapy can affect healthy  cells as well as cancer cells. Report any side effects. Continue your course of treatment even though you feel ill unless your doctor tells you to stop. What side effects may I notice from receiving this medicine? Side effects that you should report to your doctor or health care professional as soon as possible:  allergic reactions like skin rash, itching or hives, swelling of the face, lips, or tongue  red spots on the skin  redness, blistering, peeling, or loosening of the skin, including inside the mouth  signs and symptoms of a dangerous change in heartbeat or heart rhythm like chest pain; dizziness; fast or irregular heartbeat; palpitations; feeling faint or lightheaded, falls; breathing problems  signs and symptoms of increased potassium like muscle weakness; chest pain; or fast, irregular heartbeat  signs and symptoms of low potassium like muscle cramps or muscle pain; chest pain; dizziness; feeling faint or lightheaded, falls; palpitations; breathing problems; or fast, irregular heartbeat  swelling of the legs or ankles  unusually weak or tired Side effects that usually do not require medical attention (report to your doctor or health care professional if they continue or are bothersome):  diarrhea  dry skin  nail changes This list may not describe all possible side effects. Call your doctor for medical advice about side effects. You may report side effects to FDA at 1-800-FDA-1088. Where should I keep my medicine? Keep out of the reach of children. Store at room temperature between 20 and 25 degrees C (68 and 77 degrees F). Throw away any unused medicine after the expiration date. NOTE: This sheet is a summary. It may not cover all possible information. If you have questions about this medicine, talk to your doctor, pharmacist, or health care provider.  2021 Elsevier/Gold Standard (2019-04-10 13:19:08) Lung Cancer Lung cancer is an abnormal growth of cancerous cells that  forms a mass (malignant tumor) in a lung. There are several types of lung cancer. The types are based on the appearance of the tumor cells. The two most common types are:  Non-small cell lung cancer. This type of lung cancer is the most common type. Non-small cell lung cancers include squamous cell carcinoma, adenocarcinoma, and large cell carcinoma.  Small cell lung cancer. In this type of lung cancer, abnormal cells are smaller than those of non-small cell lung cancer. Small cell lung cancer gets worse (progresses) faster than non-small cell lung cancer. What are the causes? The most common cause of lung cancer is smoking tobacco. The second most common cause is exposure to a chemical called radon. What increases the risk? You are more likely to develop this condition if:  You smoke tobacco.  You have been exposed to: ? Secondhand tobacco smoke. ? Radon gas. ? Uranium. ? Asbestos. ? Arsenic in drinking water. ? Air pollution.  You have a family or personal history of lung cancer.  You have had lung radiation therapy in the past.  You are older than age 58. What are the signs or symptoms? In the early stages, you may not have any symptoms. As the cancer progresses, symptoms may include:  A lasting cough, possibly with  blood.  Fatigue.  Unexplained weight loss.  Shortness of breath.  Loud breathing (wheezing).  Chest pain.  Loss of appetite. Symptoms of advanced lung cancer include:  Hoarseness.  Bone or joint pain.  Weakness.  Change in the structure of the fingernails (clubbing), so that the nail looks like an upside-down spoon.  Swelling of the face or arms.  Inability to move the face (paralysis).  Drooping eyelids. How is this diagnosed? This condition may be diagnosed based on:  Your symptoms and medical history.  A physical exam.  A chest X-ray.  A CT scan.  Blood tests.  Sputum tests.  Removal of a sample of lung tissue (lung biopsy) for  testing. Your cancer will be assessed (staged) to determine how severe it is and how much it has spread (metastasized). How is this treated? Treatment depends on the type and stage of your cancer. Treatment may include one or more of the following:  Surgery to remove as much of the cancer as possible. Lymph nodes in the area may be removed and tested for cancer as well.  Medicines that kill cancer cells (chemotherapy).  High-energy rays that kill cancer cells (radiation therapy).  Targeted therapy. This targets specific parts of cancer cells and the area around them to block the growth and spread of the cancer. Targeted therapy can help limit the damage to healthy cells. Follow these instructions at home: Eating and drinking  Some of your treatments might affect your appetite. If you are having problems eating, or if you do not have an appetite, meet with a dietitian.  If you have side effects that affect your appetite, it may help to: ? Eat smaller meals and snacks often. ? Drink high-nutrition and high-calorie shakes or supplements. ? Eat bland and soft foods that are easy to eat. ? Avoid eating foods that are hot, spicy, or hard to swallow. General instructions  Do not use any products that contain nicotine or tobacco, such as cigarettes and e-cigarettes. If you need help quitting, ask your health care provider.  Do not drink alcohol.  If you are admitted to the hospital, make sure your cancer specialist (oncologist) is aware. Your cancer may affect your treatment for other conditions.  Take over-the-counter and prescription medicines only as told by your health care provider.  Consider joining a support group for people who have been diagnosed with lung cancer.  Work with your health care provider to manage any side effects of treatment.  Keep all follow-up visits as told by your health care provider. This is important.   Where to find more information  American Cancer  Society: https://www.cancer.[place]): https://www.cancer.gov Contact a health care provider if you:  Lose weight without trying.  Have a persistent cough and wheezing.  Feel short of breath.  Get tired easily.  Have bone or joint pain.  Have difficulty swallowing.  Notice that your voice is changing or getting hoarse.  Have pain that does not get better with medicine. Get help right away if you:  Cough up blood.  Have new breathing problems.  Have chest pain.  Have a fever.  Have swelling in an ankle, leg, or arm, or the face or neck.  Have paralysis in your face.  Are very confused.  Have a drooping eyelid. Summary  Lung cancer is an abnormal growth of cancerous cells that forms a mass (malignant tumor) in a lung.  There are several types of lung cancer. The types are  based on the appearance of the tumor cells. The two most common types are non-small cell and small cell.  The most common cause of lung cancer is smoking tobacco.  Early symptoms include a lasting cough, possibly with blood, and fatigue, unexplained weight loss, and shortness of breath.  After diagnosis, treatment depends on the type and stage of your cancer. This information is not intended to replace advice given to you by your health care provider. Make sure you discuss any questions you have with your health care provider. Document Revised: 01/09/2020 Document Reviewed: 01/09/2020 Elsevier Patient Education  2021 [employer].

## 2020-06-09 ENCOUNTER — Other Ambulatory Visit: Payer: Self-pay | Admitting: *Deleted

## 2020-06-09 DIAGNOSIS — R911 Solitary pulmonary nodule: Secondary | ICD-10-CM

## 2020-06-29 ENCOUNTER — Emergency Department (HOSPITAL_COMMUNITY)
Admission: EM | Admit: 2020-06-29 | Discharge: 2020-06-29 | Disposition: A | Discharge status: Home / Self Care | Payer: Medicare HMO | Attending: Emergency Medicine | Admitting: Emergency Medicine

## 2020-06-29 ENCOUNTER — Encounter (HOSPITAL_COMMUNITY): Payer: Self-pay

## 2020-06-29 ENCOUNTER — Emergency Department (HOSPITAL_COMMUNITY): Payer: Medicare HMO

## 2020-06-29 ENCOUNTER — Other Ambulatory Visit: Payer: Self-pay

## 2020-06-29 DIAGNOSIS — I10 Essential (primary) hypertension: Secondary | ICD-10-CM | POA: Diagnosis not present

## 2020-06-29 DIAGNOSIS — Z85118 Personal history of other malignant neoplasm of bronchus and lung: Secondary | ICD-10-CM | POA: Diagnosis not present

## 2020-06-29 DIAGNOSIS — W108XXA Fall (on) (from) other stairs and steps, initial encounter: Secondary | ICD-10-CM | POA: Diagnosis not present

## 2020-06-29 DIAGNOSIS — S0003XA Contusion of scalp, initial encounter: Secondary | ICD-10-CM | POA: Diagnosis not present

## 2020-06-29 DIAGNOSIS — S0990XA Unspecified injury of head, initial encounter: Secondary | ICD-10-CM | POA: Diagnosis present

## 2020-06-29 DIAGNOSIS — Y92009 Unspecified place in unspecified non-institutional (private) residence as the place of occurrence of the external cause: Secondary | ICD-10-CM | POA: Diagnosis not present

## 2020-06-29 DIAGNOSIS — W19XXXA Unspecified fall, initial encounter: Secondary | ICD-10-CM

## 2020-06-29 HISTORY — DX: Malignant (primary) neoplasm, unspecified: C80.1

## 2020-06-29 MED ORDER — IBUPROFEN 600 MG PO TABS: 600.0000 mg | ORAL_TABLET | Freq: Four times a day (QID) | ORAL | 0 refills | Status: DC | PRN

## 2020-06-29 MED ORDER — ACETAMINOPHEN 500 MG PO TABS
1000.0000 mg | ORAL_TABLET | Freq: Once | ORAL | Status: AC
  Administered 2020-06-29: 1000 mg via ORAL
  Filled 2020-06-29: qty 2

## 2020-06-29 NOTE — ED Triage Notes (Signed)
Patient states she fell coming out of her house and fell on the steps due to ice. Patient states she fell backwards and hit her head.patient has a small abrasion to the left forearm. Patient denies LOC.

## 2020-06-29 NOTE — ED Provider Notes (Signed)
Care transferred to me.  Head CT is benign.  She feels well.  Will discharge home is a minor head injury.  We discussed return precautions.   Sherwood Gambler, MD 06/29/20 1640

## 2020-06-29 NOTE — ED Provider Notes (Signed)
[facility] DEPT Provider Note   CSN: [id number] Arrival date & time: 06/29/20  3151     History No chief complaint on file.   Shelby Green is a 70 y.o. female.  Pt presents to the ED today with a headache after a fall.  Pt said she was walking out of her house and slipped on some ice.  She fell backwards and hit her head.  She had no loc.  She is not on blood thinners.  No n/v.  She sustained a small abrasion to her left forearm, but otherwise, no other pain.         Past Medical History:  Diagnosis Date  . Allergy   . Back pain   . Cancer ([place])   . Hypertension   . Hypertension   . Lung mass   . Wears dentures   . Wears glasses     Patient Active Problem List   Diagnosis Date Noted  . Encounter for antineoplastic chemotherapy 06/02/2020  . 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 Benign essential hypertension 03/28/2016    Past Surgical History:  Procedure Laterality Date  . COLONOSCOPY W/ BIOPSIES AND POLYPECTOMY    . MULTIPLE TOOTH EXTRACTIONS    . VIDEO ASSISTED THORACOSCOPY (VATS)/WEDGE RESECTION Right 02/14/2018   Procedure: RIGHT  VIDEO ASSISTED THORACOSCOPY (VATS) WITH RIGHT LOWER LOBE (RLL) WEDGE RESECTION, COMPLETION RLL LOBECTOMY, LYMPH NODE DISSECTION, ON-Q PLACEMENT;  Surgeon: Grace Isaac, MD;  Location: [facility];  Service: Thoracic;  Laterality: Right;  Marland Kitchen VIDEO BRONCHOSCOPY N/A 02/14/2018   Procedure: VIDEO BRONCHOSCOPY;  Surgeon: Grace Isaac, MD;  Location: [facility] OR;  Service: Thoracic;  Laterality: N/A;     OB History   No obstetric history on file.     Family History  Problem Relation Age of Onset  . Arthritis Mother   . Hypertension Mother   . Stroke Mother   . Hypertension Sister     Social History   Tobacco Use  . Smoking status: Never Smoker  . Smokeless tobacco: Never Used  Substance Use Topics  . Alcohol use: No  . Drug  use: No    Home Medications Prior to Admission medications   Medication Sig Start Date End Date Taking? Authorizing Provider  ibuprofen (ADVIL) 600 MG tablet Take 1 tablet (600 mg total) by mouth every 6 (six) hours as needed. 06/29/20  Yes Isla Pence, MD  doxazosin (CARDURA) 2 MG tablet Take 2 mg by mouth daily.    [provider]  latanoprost (XALATAN) 0.005 % ophthalmic solution  03/30/18   [provider]    Allergies    Metoprolol and Penicillin g  Review of Systems   Review of Systems  Neurological: Positive for headaches.  All other systems reviewed and are negative.   Physical Exam Updated Vital Signs BP (!) 154/91 (BP Location: Left Arm)   Pulse 61   Temp 99 F (37.2 C) (Oral)   Resp 16   Ht 5' 3.5" (1.613 m)   Wt 68.9 kg   SpO2 99%   BMI 26.50 kg/m   Physical Exam Vitals and nursing note reviewed.  Constitutional:      Appearance: Normal appearance.  HENT:     Head: Normocephalic and atraumatic.      Right Ear: External ear normal.     Left Ear: External ear normal.     Nose: Nose normal.  Mouth/Throat:     Mouth: Mucous membranes are moist.     Pharynx: Oropharynx is clear.  Eyes:     Extraocular Movements: Extraocular movements intact.     Conjunctiva/sclera: Conjunctivae normal.     Pupils: Pupils are equal, round, and reactive to light.  Cardiovascular:     Rate and Rhythm: Normal rate and regular rhythm.     Pulses: Normal pulses.     Heart sounds: Normal heart sounds.  Pulmonary:     Effort: Pulmonary effort is normal.     Breath sounds: Normal breath sounds.  Abdominal:     General: Abdomen is flat. Bowel sounds are normal.     Palpations: Abdomen is soft.  Musculoskeletal:        General: Normal range of motion.       Arms:     Cervical back: Normal range of motion and neck supple.  Skin:    General: Skin is warm.     Capillary Refill: Capillary refill takes less than 2 seconds.  Neurological:     General:  No focal deficit present.     Mental Status: She is alert and oriented to person, place, and time.  Psychiatric:        Mood and Affect: Mood normal.        Behavior: Behavior normal.        Thought Content: Thought content normal.        Judgment: Judgment normal.     ED Results / Procedures / Treatments   Labs (all labs ordered are listed, but only abnormal results are displayed) Labs Reviewed - No data to display  EKG None  Radiology No results found.  Procedures Procedures   Medications Ordered in ED Medications - No data to display  ED Course  I have reviewed the triage vital signs and the nursing notes.  Pertinent labs & imaging results that were available during my care of the patient were reviewed by me and considered in my medical decision making (see chart for details).    MDM Rules/Calculators/A&P                          CT scan ordered due to headache after fall.  Pt's left arm has a small abrasion, but good rom and no swelling.  No need for xray.  Pt signed out at shift change to Dr. Regenia Skeeter pending ct head.  Final Clinical Impression(s) / ED Diagnoses Final diagnoses:  Fall, initial encounter  Contusion of scalp, initial encounter    Rx / DC Orders ED Discharge Orders         Ordered    ibuprofen (ADVIL) 600 MG tablet  Every 6 hours PRN        06/29/20 1529           Isla Pence, MD 06/29/20 1529

## 2020-07-16 ENCOUNTER — Ambulatory Visit: Payer: Medicare HMO | Admitting: Cardiothoracic Surgery

## 2020-07-16 ENCOUNTER — Ambulatory Visit
Admission: RE | Admit: 2020-07-16 | Discharge: 2020-07-16 | Disposition: A | Discharge status: Home / Self Care | Payer: Medicare HMO | Source: Ambulatory Visit | Attending: Cardiothoracic Surgery | Admitting: Cardiothoracic Surgery

## 2020-07-16 ENCOUNTER — Other Ambulatory Visit: Payer: Self-pay

## 2020-07-16 VITALS — BP 170/90 | HR 64 | Resp 20 | Ht 64.0 in | Wt 152.0 lb

## 2020-07-16 DIAGNOSIS — R911 Solitary pulmonary nodule: Secondary | ICD-10-CM | POA: Diagnosis not present

## 2020-07-16 DIAGNOSIS — Z85118 Personal history of other malignant neoplasm of bronchus and lung: Secondary | ICD-10-CM | POA: Diagnosis not present

## 2020-07-16 DIAGNOSIS — Z902 Acquired absence of lung [part of]: Secondary | ICD-10-CM | POA: Diagnosis not present

## 2020-07-16 NOTE — Progress Notes (Signed)
[address]       [place] [address]             [phone]      Shelby Green Medical Record #[id number] Date of Birth: 12-22-1950  Referring: Gwenevere Ghazi, MD Primary Care: [name], PA-C Primary Cardiologist: No primary care provider on file.   Chief Complaint:   POST OP FOLLOW UP OPERATIVE REPORT DATE OF PROCEDURE:  02/14/2018 PREOPERATIVE DIAGNOSIS:  Right lower lobe lung mass. POSTOPERATIVE DIAGNOSIS:  Right lower lobe lung mass.  Nonsmall nonsmall-cell lung cancer. PROCEDURE PERFORMED:  Bronchoscopy, right video-assisted thoracoscopy, wedge resection of the right lower lobe, completion right lower lobectomy with lymph node dissection and On-Q placement. SURGEON:  Lanelle Bal, MD  History of Present Illness:     Patient returns to the office today for follow-up visit after resection of a right lower lobe lung mass stage Ib non-small cell carcinoma, this was done approximately 29 months  months ago.  She  tolerated resection without difficulty.  Patient is a lifelong non-smoker continues to be so.  She notes her husband does smoke heavily.  Since surgery she has been followed postoperatively with CT scan of the chest every 6 months.  Follow-up CT scan and PET scan was done in the fall 2021 , CT was suspicious for small pulmonary nodules 2 in the left upper lobe and one in the right  She was seen . follow-up by medical oncology, with the very small size of the nodules no specific diagnosis or treatment has been done.  She returns to the office now with a follow-up CT of the chest.    Cancer Staging Bronchogenic lung cancer, right lower lobe ; [facility]) Staging form: Lung, AJCC 8th Edition - Pathologic stage from 02/19/2018: Stage IB (pT2a, pN0, cM0) - Signed by Grace Isaac, MD on 02/19/2018     Past Medical History:  Diagnosis Date  . Allergy   . Back pain   . Cancer ([place])   . Hypertension   .  Hypertension   . Lung mass   . Wears dentures   . Wears glasses      Social History   Tobacco Use  Smoking Status Never Smoker  Smokeless Tobacco Never Used    Social History   Substance and Sexual Activity  Alcohol Use No     Allergies  Allergen Reactions  . Metoprolol Other (See Comments)    Had left shoulder/arm pain and left sided back pain  . Penicillin G Itching    Has patient had a PCN reaction causing immediate rash, facial/tongue/throat swelling, SOB or lightheadedness with hypotension: No Has patient had a PCN reaction causing severe rash involving mucus membranes or skin necrosis: No Has patient had a PCN reaction that required hospitalization: No Has patient had a PCN reaction occurring within the last 10 years: No If all of the above answers are "NO", then may proceed with Cephalosporin use.     Current Outpatient Medications  Medication Sig Dispense Refill  . doxazosin (CARDURA) 2 MG tablet Take 2 mg by mouth daily.    Marland Kitchen ibuprofen (ADVIL) 600 MG tablet Take 1 tablet (600 mg total) by mouth every 6 (six) hours as needed. 30 tablet 0  . latanoprost (XALATAN) 0.005 % ophthalmic solution      No current facility-administered medications for this visit.       Physical Exam: BP (!) 170/90   Pulse 64  Resp 20   Ht 5\' 4"  (1.626 m)   Wt 152 lb (68.9 kg)   SpO2 96% Comment: RA  BMI 26.09 kg/m  General appearance: alert, cooperative and no distress Lymph nodes: Cervical, supraclavicular, and axillary nodes normal. Resp: clear to auscultation bilaterally Cardio: regular rate and rhythm, S1, S2 normal, no murmur, click, rub or gallop Extremities: extremities normal, atraumatic, no cyanosis or edema Neurologic: Grossly normal   Diagnostic Studies & Laboratory data:     Recent Radiology Findings:  CT Head Wo Contrast  Result Date: 06/29/2020 CLINICAL DATA:  Posttraumatic headache after fall. EXAM: CT HEAD WITHOUT CONTRAST TECHNIQUE: Contiguous axial  images were obtained from the base of the skull through the vertex without intravenous contrast. COMPARISON:  None. FINDINGS: Brain: No evidence of acute infarction, hemorrhage, hydrocephalus, extra-axial collection or mass lesion/mass effect. Vascular: No hyperdense vessel or unexpected calcification. Skull: Normal. Negative for fracture or focal lesion. Sinuses/Orbits: No acute finding. Other: None. IMPRESSION: Normal head CT. Electronically Signed   By: Marijo Conception M.D.   On: 06/29/2020 16:32   CT Super D Chest Wo Contrast  Result Date: 07/17/2020 CLINICAL DATA:  Status post Vats for right lung cancer. Bilateral pulmonary nodules on prior chest CT. Prior right lower lobectomy. EXAM: CT CHEST WITHOUT CONTRAST TECHNIQUE: Multidetector CT imaging of the chest was performed using thin slice collimation for electromagnetic bronchoscopy planning purposes, without intravenous contrast. COMPARISON:  Chest CTs including 03/19/2020. PET of 04/09/2020 is also reviewed. FINDINGS: Cardiovascular: Aortic atherosclerosis. Tortuous thoracic aorta. Mild cardiomegaly, accentuated by a pectus excavatum deformity. No pericardial effusion. Mediastinum/Nodes: No mediastinal adenopathy. No correlate for the hypermetabolism on prior PET within the low right mediastinum. Hilar regions poorly evaluated without intravenous contrast. Esophageal fluid level on 63/2. Lungs/Pleura: Trace bilateral pleural fluid, similar. Right lower lobectomy. Right-sided pulmonary nodule measures 5 mm on 96/8 and is not significantly changed. Adjacent left apical pulmonary nodules including at 6 mm on 36/8 (versus 5 mm on the prior exam) and 6 mm on image 40/8, unchanged. No new pulmonary nodule. Lateral right upper lobe scarring and pleural thickening are unchanged on 92/8. Upper Abdomen: Normal imaged portions of the liver, spleen, stomach, adrenal glands. Upper pole left renal 1.7 cm low-density lesion is likely a cyst. Musculoskeletal: No acute  osseous abnormality. IMPRESSION: 1. Status post right lower lobectomy. 2. Bilateral pulmonary nodules. One of 2 left apical nodules may be minimally enlarged. The other 2 nodules are similar in size over approximately 4 months. These are indeterminate, but remains suspicious based on interval enlargement compared to 09/12/2019. 3. No thoracic adenopathy. 4. Esophageal air fluid level suggests dysmotility or gastroesophageal reflux. 5. Similar trace bilateral pleural fluid. Electronically Signed   By: Abigail Miyamoto M.D.   On: 07/17/2020 11:26   NM PET Image Restag (PS) Skull Base To Thigh  Result Date: 04/09/2020 CLINICAL DATA:  Subsequent treatment strategy for non-small cell lung cancer. History of right lower lobe lobectomy. Enlarging pulmonary nodules. Evaluate for metastatic disease. EXAM: NUCLEAR MEDICINE PET SKULL BASE TO THIGH TECHNIQUE: 7.5 mCi F-18 FDG was injected intravenously. Full-ring PET imaging was performed from the skull base to thigh after the radiotracer. CT data was obtained and used for attenuation correction and anatomic localization. Fasting blood glucose: 90 mg/dl COMPARISON:  Prior CT scans 03/07/2019, 09/12/2019 and 03/19/2020. FINDINGS: Mediastinal blood pool activity: SUV max 2.18 Liver activity: SUV max NA NECK: No hypermetabolic lymph nodes in the neck. Incidental CT findings: none CHEST: 2 left upper lobe  pulmonary nodules and 1 right lower lobe pulmonary nodule are again demonstrated. These are below the limits of resolution for accurate PET-CT characterization. However, the 4 mm left upper lobe nodule slightly more anterior and inferior compared to the adjacent nodule appears to have slight hypermetabolism with SUV max of 1.91. Given its small size is is worrisome. I do not see any definite uptake in the adjacent nodule or in the right lower lobe nodule. There is a small focus of hypermetabolism noted in the right lower mediastinum adjacent to the right lower lobe bronchus  appears difficult to identify lymph node in this area without contrast but the SUV max is 1.61 and certainly suspicious. A contrast enhanced CT scan may be helpful for further evaluation. No breast masses, supraclavicular or axillary adenopathy. Incidental CT findings: none ABDOMEN/PELVIS: No abnormal hypermetabolic activity within the liver, pancreas, adrenal glands, or spleen. No hypermetabolic lymph nodes in the abdomen or pelvis. Incidental CT findings: Periumbilical abdominal wall hernia containing fat is noted. Scattered vascular calcifications. Retroverted uterus with small fibroids. Large left-sided sacral Tarlov cyst. SKELETON: No focal hypermetabolic activity to suggest skeletal metastasis. Incidental CT findings: none IMPRESSION: 1. Three pulmonary nodules are below the limits of resolution for PET-CT. However, a left upper lobe nodule measuring 4 mm appears to be mildly hypermetabolic and is suspicious for a metastatic focus. 2. Focus of hypermetabolism in the right lower mediastinum is suspicious for a metastatic lymph node although difficult to see on the CT scan. 3. No other areas of metastatic disease are identified. Electronically Signed   By: Marijo Sanes M.D.   On: 04/09/2020 14:30   CT CHEST WO CONTRAST  Result Date: 03/19/2020 CLINICAL DATA:  Right lung cancer, status post VATS.  Restaging. EXAM: CT CHEST WITHOUT CONTRAST TECHNIQUE: Multidetector CT imaging of the chest was performed following the standard protocol without IV contrast. COMPARISON:  09/12/2019 FINDINGS: Cardiovascular: Heart size upper normal. Trace pericardial effusion noted. Atherosclerotic calcification is noted in the wall of the thoracic aorta. Mediastinum/Nodes: No mediastinal lymphadenopathy. No evidence for gross hilar lymphadenopathy although assessment is limited by the lack of intravenous contrast on today's study. The esophagus has normal imaging features. There is no axillary lymphadenopathy. Lungs/Pleura: 3 mm  anterior left upper lobe pulmonary nodule measured previously is 6 mm on image 34/8 today. A second 3 mm left upper lobe nodule measured on the prior study is 5 mm today (30/8). 5 mm nodule posterior right upper lobe (93/8) was 3 mm previously (remeasured). Tiny 1-2 mm posterior right middle lobe nodule on 90/8 is unchanged. Status post right lower lobectomy with lateral scarring in the right middle lobe, stable. No focal airspace consolidation. No evidence for pulmonary edema. Tiny layering left pleural effusion is new in the interval. Upper Abdomen: 2.6 cm exophytic water density lesion upper pole left kidney is stable, compatible with a cyst. Musculoskeletal: No worrisome lytic or sclerotic osseous abnormality. IMPRESSION: 1. Small bilateral pulmonary nodules (2 left upper lobe and 1 right middle lobe) show minimal but definite interval increase in size since 09/12/2019. Imaging features concerning for metastatic disease. No new pulmonary nodule or mass. 2. Tiny layering left pleural effusion, new in the interval. 3. Aortic Atherosclerosis (ICD10-I70.0). Electronically Signed   By: Misty Stanley M.D.   On: 03/19/2020 10:22   I have independently reviewed the above radiology studies  and reviewed the findings with the patient.    Recent Lab Findings: Lab Results  Component Value Date   WBC 4.3 05/05/2020  HGB 12.7 05/05/2020   HCT 38.9 05/05/2020   PLT 220 05/05/2020   GLUCOSE 85 05/05/2020   ALT 21 05/05/2020   AST 21 05/05/2020   NA 142 05/05/2020   K 3.8 05/05/2020   CL 107 05/05/2020   CREATININE 0.99 05/05/2020   BUN 16 05/05/2020   CO2 29 05/05/2020   TSH 1.532 08/01/2012   INR 1.00 02/13/2018      Assessment / Plan:   #1  Previous right lower lobectomy for stage Ib non-small cell carcinoma the lung done 29  months ago-follow-up CT scan suggests increasing bilateral pulmonary nodules suspicious for metastatic disease-pulmonary nodules are small and would be difficult to directly  biopsy.  PET scan shows some mild activity in the right lower mediastinum without definite CT findings of enlarged nodes. [place] 4 months one of the left upper lobe nodules is minimally enlarged the others are unchanged.  -Patient is being followed with medical oncology office She has a follow-up appointment April 11  We will make her a follow-up appointment in 4 months with a repeat super D CT scan with Dr. Jerrye Beavers can be modified depending on treatment plans and follow-up by medical oncology    Grace Isaac MD      [place] ,[place] [address] Office [phone]   Beeper [phone]  07/20/2020 11:34 AM

## 2020-07-16 NOTE — Progress Notes (Signed)
      [address]       [place] [address]             [phone]    Called and further discussed phone report on the PET scan with the patient. With the concern of possible recurrent disease we will have the patient be seen by Dr. Darl Pikes oncology Also will obtain MRI of the brain to rule out metastasis After the scan will further evaluate for the best way to obtain repeat biopsy Previous resection specimen has been sent for molecular testing Patient is aware that radiology will call for scheduling a MRI of the brain and also Dr. Worthy Flank office will call for an appointment

## 2020-07-21 ENCOUNTER — Encounter: Payer: Self-pay | Admitting: *Deleted

## 2020-07-21 NOTE — Progress Notes (Signed)
I updated Dr. Julien Nordmann on Shelby Green's recent scan.  He states she does not need to come in sooner than April. She is set up with appts in April.

## 2020-08-28 ENCOUNTER — Inpatient Hospital Stay: Payer: Medicare HMO | Attending: Internal Medicine

## 2020-08-28 ENCOUNTER — Other Ambulatory Visit: Payer: Self-pay

## 2020-08-28 DIAGNOSIS — C3431 Malignant neoplasm of lower lobe, right bronchus or lung: Secondary | ICD-10-CM | POA: Insufficient documentation

## 2020-08-28 DIAGNOSIS — R918 Other nonspecific abnormal finding of lung field: Secondary | ICD-10-CM | POA: Diagnosis not present

## 2020-08-28 DIAGNOSIS — C3490 Malignant neoplasm of unspecified part of unspecified bronchus or lung: Secondary | ICD-10-CM

## 2020-08-28 LAB — CBC WITH DIFFERENTIAL (CANCER CENTER ONLY)
Abs Immature Granulocytes: 0.01 10*3/uL (ref 0.00–0.07)
Basophils Absolute: 0.1 10*3/uL (ref 0.0–0.1)
Basophils Relative: 1 %
Eosinophils Absolute: 0.2 10*3/uL (ref 0.0–0.5)
Eosinophils Relative: 6 %
HCT: 39.5 % (ref 36.0–46.0)
Hemoglobin: 12.7 g/dL (ref 12.0–15.0)
Immature Granulocytes: 0 %
Lymphocytes Relative: 40 %
Lymphs Abs: 1.5 10*3/uL (ref 0.7–4.0)
MCH: 29.1 pg (ref 26.0–34.0)
MCHC: 32.2 g/dL (ref 30.0–36.0)
MCV: 90.6 fL (ref 80.0–100.0)
Monocytes Absolute: 0.4 10*3/uL (ref 0.1–1.0)
Monocytes Relative: 10 %
Neutro Abs: 1.6 10*3/uL — ABNORMAL LOW (ref 1.7–7.7)
Neutrophils Relative %: 43 %
Platelet Count: 215 10*3/uL (ref 150–400)
RBC: 4.36 MIL/uL (ref 3.87–5.11)
RDW: 13.5 % (ref 11.5–15.5)
WBC Count: 3.7 10*3/uL — ABNORMAL LOW (ref 4.0–10.5)
nRBC: 0 % (ref 0.0–0.2)

## 2020-08-28 LAB — CMP (CANCER CENTER ONLY)
ALT: 18 U/L (ref 0–44)
AST: 18 U/L (ref 15–41)
Albumin: 3.7 g/dL (ref 3.5–5.0)
Alkaline Phosphatase: 64 U/L (ref 38–126)
Anion gap: 11 (ref 5–15)
BUN: 14 mg/dL (ref 8–23)
CO2: 26 mmol/L (ref 22–32)
Calcium: 8.5 mg/dL — ABNORMAL LOW (ref 8.9–10.3)
Chloride: 108 mmol/L (ref 98–111)
Creatinine: 0.87 mg/dL (ref 0.44–1.00)
GFR, Estimated: >60 mL/min (ref >60)
Glucose, Bld: 92 mg/dL (ref 70–99)
Potassium: 4 mmol/L (ref 3.5–5.1)
Sodium: 145 mmol/L (ref 135–145)
Total Bilirubin: 0.7 mg/dL (ref 0.3–1.2)
Total Protein: 6.3 g/dL — ABNORMAL LOW (ref 6.5–8.1)

## 2020-08-31 ENCOUNTER — Other Ambulatory Visit: Payer: Self-pay

## 2020-08-31 ENCOUNTER — Inpatient Hospital Stay (HOSPITAL_BASED_OUTPATIENT_CLINIC_OR_DEPARTMENT_OTHER): Payer: Medicare HMO | Admitting: Internal Medicine

## 2020-08-31 VITALS — BP 159/87 | HR 66 | Temp 96.2°F | Resp 18 | Ht 64.0 in | Wt 154.9 lb

## 2020-08-31 DIAGNOSIS — Z5111 Encounter for antineoplastic chemotherapy: Secondary | ICD-10-CM

## 2020-08-31 DIAGNOSIS — I10 Essential (primary) hypertension: Secondary | ICD-10-CM | POA: Diagnosis not present

## 2020-08-31 DIAGNOSIS — C3490 Malignant neoplasm of unspecified part of unspecified bronchus or lung: Secondary | ICD-10-CM

## 2020-08-31 DIAGNOSIS — C3491 Malignant neoplasm of unspecified part of right bronchus or lung: Secondary | ICD-10-CM | POA: Diagnosis not present

## 2020-08-31 DIAGNOSIS — C3431 Malignant neoplasm of lower lobe, right bronchus or lung: Secondary | ICD-10-CM | POA: Diagnosis not present

## 2020-08-31 NOTE — Progress Notes (Signed)
[place] [facility] [phone]   Fax:([phone]) [phone]  OFFICE PROGRESS NOTE  [name], PA-C [address] [place] [address]  DIAGNOSIS: Recurrent non-small cell lung cancer initially diagnosed as stage Ib (T2 a, N0, M0) non-small cell lung cancer, adenocarcinoma in September 2019.  The patient had bilateral pulmonary nodules including 2 nodules in the left upper lobe and 1 nodule in the right middle lobe in December 2021. Biomarker Findings Microsatellite status - MS-Stable Tumor Mutational Burden - 4 Muts/Mb Genomic Findings For a complete list of the genes assayed, please refer to the Appendix. 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 7 Disease relevant genes with no reportable alterations: ALK, BRAF, ERBB2, KRAS, MET, RET, ROS1  PDL1 Expression: 1%.  PRIOR THERAPY: Status post right lower lobectomy with lymph node dissection on February 10, 2018 under the care of Dr. Servando Snare.  CURRENT THERAPY: None.  INTERVAL HISTORY: Shelby Green 70 y.o. female returns to the clinic today for follow-up visit.  The patient is feeling fine today with no concerning complaints except for mild shortness of breath with exertion but no significant chest pain, cough or hemoptysis.  She denied having any fever or chills.  She has no nausea, vomiting, diarrhea or constipation.  She denied having any headache or visual changes.  The patient was supposed to have repeat CT scan of the chest before this visit but she had the scan 6 weeks ago by Dr. Servando Snare that showed mild increase in the pulmonary nodules.  She is here today for evaluation and discussion of her scan and treatment options.  MEDICAL HISTORY: Past Medical History:  Diagnosis Date  . Allergy   . Back pain   . Cancer ([place])   . Hypertension   . Hypertension   . Lung mass   . Wears dentures   . Wears glasses     ALLERGIES:  is allergic to metoprolol and  penicillin g.  MEDICATIONS:  Current Outpatient Medications  Medication Sig Dispense Refill  . doxazosin (CARDURA) 2 MG tablet Take 2 mg by mouth daily.    Marland Kitchen ibuprofen (ADVIL) 600 MG tablet Take 1 tablet (600 mg total) by mouth every 6 (six) hours as needed. 30 tablet 0  . latanoprost (XALATAN) 0.005 % ophthalmic solution      No current facility-administered medications for this visit.    SURGICAL HISTORY:  Past Surgical History:  Procedure Laterality Date  . COLONOSCOPY W/ BIOPSIES AND POLYPECTOMY    . MULTIPLE TOOTH EXTRACTIONS    . VIDEO ASSISTED THORACOSCOPY (VATS)/WEDGE RESECTION Right 02/14/2018   Procedure: RIGHT  VIDEO ASSISTED THORACOSCOPY (VATS) WITH RIGHT LOWER LOBE (RLL) WEDGE RESECTION, COMPLETION RLL LOBECTOMY, LYMPH NODE DISSECTION, ON-Q PLACEMENT;  Surgeon: Grace Isaac, MD;  Location: [facility];  Service: Thoracic;  Laterality: Right;  Marland Kitchen VIDEO BRONCHOSCOPY N/A 02/14/2018   Procedure: VIDEO BRONCHOSCOPY;  Surgeon: Grace Isaac, MD;  Location: [facility] OR;  Service: Thoracic;  Laterality: N/A;    REVIEW OF SYSTEMS:  A comprehensive review of systems was negative except for: Constitutional: positive for fatigue Respiratory: positive for dyspnea on exertion   PHYSICAL EXAMINATION: General appearance: alert, cooperative and no distress Head: Normocephalic, without obvious abnormality, atraumatic Neck: no adenopathy, no JVD, supple, symmetrical, trachea midline and thyroid not enlarged, symmetric, no tenderness/mass/nodules Lymph nodes: Cervical, supraclavicular, and axillary nodes normal. Resp: clear to auscultation bilaterally Back: symmetric, no curvature. ROM normal. No CVA tenderness. Cardio: regular  rate and rhythm, S1, S2 normal, no murmur, click, rub or gallop GI: soft, non-tender; bowel sounds normal; no masses,  no organomegaly Extremities: extremities normal, atraumatic, no cyanosis or edema  ECOG PERFORMANCE STATUS: 1 - Symptomatic but completely  ambulatory  Blood pressure (!) 159/87, pulse 66, temperature (!) 96.2 F (35.7 C), temperature source Tympanic, resp. rate 18, height '5\' 4"'  (1.626 m), weight 154 lb 14.4 oz (70.3 kg), SpO2 100 %.  LABORATORY DATA: Lab Results  Component Value Date   WBC 3.7 (L) 08/28/2020   HGB 12.7 08/28/2020   HCT 39.5 08/28/2020   MCV 90.6 08/28/2020   PLT 215 08/28/2020      Chemistry      Component Value Date/Time   NA 145 08/28/2020 0824   K 4.0 08/28/2020 0824   CL 108 08/28/2020 0824   CO2 26 08/28/2020 0824   BUN 14 08/28/2020 0824   CREATININE 0.87 08/28/2020 0824   CREATININE 0.83 08/06/2015 1616      Component Value Date/Time   CALCIUM 8.5 (L) 08/28/2020 0824   ALKPHOS 64 08/28/2020 0824   AST 18 08/28/2020 0824   ALT 18 08/28/2020 0824   BILITOT 0.7 08/28/2020 0824       RADIOGRAPHIC STUDIES: No results found.  ASSESSMENT AND PLAN: This is a very pleasant 70 years old African-American female with highly suspicious recurrent non-small cell lung cancer, initially diagnosed as a stage Ib (T2 a, N0, M0) non-small cell lung cancer, adenocarcinoma status post right lower lobectomy with lymph node dissection in September 2019 under the care of Dr. Servando Snare.  The patient had evidence for disease recurrence in December 2021 presenting with 2 pulmonary nodules in the left upper lobe as well as 1 nodule in the right middle lobe. 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  Her PD-L1 expression is 1%. Her last CT scan of the chest 6 weeks ago showed mild increase in the pulmonary nodules.  I had a lengthy discussion with the patient and her friend today about her condition and treatment options.  I offered the patient treatment with Tagrisso because of the EGFR mutation and the suspicious disease recurrence but she would like to hold on this treatment for now. I provided her with handout about the treatment. I will see her back for  follow-up visit in 3 months for evaluation and repeat CT scan of the chest for restaging of her disease. She was advised to call immediately if she has any concerning symptoms in the interval. The patient voices understanding of current disease status and treatment options and is in agreement with the current care plan.  All questions were answered. The patient knows to call the clinic with any problems, questions or concerns. We can certainly see the patient much sooner if necessary.  The total time spent in the appointment was 30 minutes.  Disclaimer: This note was dictated with voice recognition software. Similar sounding words can inadvertently be transcribed and may not be corrected upon review.

## 2020-08-31 NOTE — Patient Instructions (Signed)
Osimertinib oral tablets What is this medicine? Osimertinib (OH sim ER ti nib) is a medicine that targets proteins in cancer cells and stops the cancer cells from growing. It is used to treat non-small cell lung cancer. This medicine may be used for other purposes; ask your health care provider or pharmacist if you have questions. COMMON BRAND NAME(S): Tagrisso What should I tell my health care provider before I take this medicine? They need to know if you have any of these conditions:  heart disease  history of irregular heartbeat  history of low levels of calcium, magnesium, or potassium in the blood  lung or breathing disease, like asthma  QT prolongation  scarring or thickening of the lungs  an unusual or allergic reaction to osimertinib, other medicines, foods, dyes, or preservatives  pregnant or trying to get pregnant  breast-feeding How should I use this medicine? Take osimertinib tablets by mouth with a glass of water. You can take it with or without food. Follow the directions on the prescription label. Take your medicine at regular intervals. Do not take it more often than directed. Do not stop taking except on your doctor's advice. If swallowing is difficult, you can take the tablet by placing your dose in a container with 2 ounces of cool water only. Stir until the tablet is in small pieces. The tablet will not completely dissolve. Do not crush or chew. Drink the water and the tablet pieces right away. Then add 4 to 8 ounces of water to the same container and drink to make sure you take your full dose. Talk to your pediatrician regarding the use of this medicine in children. Special care may be needed. Overdosage: If you think you have taken too much of this medicine contact a poison control center or emergency room at once. NOTE: This medicine is only for you. Do not share this medicine with others. What if I miss a dose? If you miss a dose, do not make up for the missed  dose. Take your next dose at your regular time. Do not take extra or double doses. What may interact with this medicine? Do not take this medicine with any of the following medications:  cisapride  dronedarone  pimozide  thioridazine This medicine may interact with the following medications:  certain medicines for seizures like carbamazepine, phenobarbital, phenytoin  other medicines that prolong the QT interval (cause an abnormal heart rhythm) like dofetilide, ziprasidone  rifampin  rosuvastatin  St. John's Wort  sulfasalazine  topotecan This list may not describe all possible interactions. Give your health care provider a list of all the medicines, herbs, non-prescription drugs, or dietary supplements you use. Also tell them if you smoke, drink alcohol, or use illegal drugs. Some items may interact with your medicine. What should I watch for while using this medicine? Visit your doctor for regular check ups. Report any side effects. Continue your course of treatment unless your doctor tells you to stop. You will need blood work done while you are taking this medicine. Do not become pregnant while taking this medicine or for 6 weeks after the last dose. Males with female partners of reproductive potential should use effective contraception for 4 months after the last dose. Women should inform their doctor if they wish to become pregnant or think they might be pregnant. There is a potential for serious side effects to an unborn child. This medicine may interfere with the ability to have a child for both men and women.  You should talk with your doctor or health care professional if you are concerned about your fertility. Talk to your health care professional or pharmacist for more information. Do not breast-feed an infant while taking this medicine or for 2 weeks after the last dose. This drug may make you feel generally unwell. This is not uncommon, as chemotherapy can affect healthy  cells as well as cancer cells. Report any side effects. Continue your course of treatment even though you feel ill unless your doctor tells you to stop. What side effects may I notice from receiving this medicine? Side effects that you should report to your doctor or health care professional as soon as possible:  allergic reactions like skin rash, itching or hives, swelling of the face, lips, or tongue  red spots on the skin  redness, blistering, peeling, or loosening of the skin, including inside the mouth  signs and symptoms of a dangerous change in heartbeat or heart rhythm like chest pain; dizziness; fast or irregular heartbeat; palpitations; feeling faint or lightheaded, falls; breathing problems  signs and symptoms of increased potassium like muscle weakness; chest pain; or fast, irregular heartbeat  signs and symptoms of low potassium like muscle cramps or muscle pain; chest pain; dizziness; feeling faint or lightheaded, falls; palpitations; breathing problems; or fast, irregular heartbeat  swelling of the legs or ankles  unusually weak or tired Side effects that usually do not require medical attention (report to your doctor or health care professional if they continue or are bothersome):  diarrhea  dry skin  nail changes This list may not describe all possible side effects. Call your doctor for medical advice about side effects. You may report side effects to FDA at 1-800-FDA-1088. Where should I keep my medicine? Keep out of the reach of children. Store at room temperature between 20 and 25 degrees C (68 and 77 degrees F). Throw away any unused medicine after the expiration date. NOTE: This sheet is a summary. It may not cover all possible information. If you have questions about this medicine, talk to your doctor, pharmacist, or health care provider.  2021 Elsevier/Gold Standard (2019-04-10 13:19:08)

## 2020-09-01 ENCOUNTER — Telehealth: Payer: Self-pay | Admitting: Medical Oncology

## 2020-09-01 ENCOUNTER — Telehealth: Payer: Self-pay

## 2020-09-01 ENCOUNTER — Other Ambulatory Visit: Payer: Self-pay | Admitting: Medical Oncology

## 2020-09-01 ENCOUNTER — Other Ambulatory Visit (HOSPITAL_COMMUNITY): Payer: Self-pay

## 2020-09-01 ENCOUNTER — Telehealth: Payer: Self-pay | Admitting: Pharmacist

## 2020-09-01 DIAGNOSIS — C3491 Malignant neoplasm of unspecified part of right bronchus or lung: Secondary | ICD-10-CM

## 2020-09-01 MED ORDER — OSIMERTINIB MESYLATE 80 MG PO TABS
80.0000 mg | ORAL_TABLET | Freq: Every day | ORAL | 1 refills | Status: DC
  Filled 2020-09-01: qty 30, 30d supply, fill #0

## 2020-09-01 NOTE — Telephone Encounter (Signed)
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  Prescription dose and frequency assessed for appropriateness. Appropriate for therapy initiation.   CBC w/ Diff and CMP from 08/28/20 assessed, labs stable for treatment initiation.   Current medication list in Epic reviewed, no relevant/significant DDIs with Tagrisso identified.  Evaluated chart and no patient barriers to medication adherence noted.   Prescription has been e-scribed to the [facility] for benefits analysis and approval.  Oral Oncology Clinic will continue to follow for insurance authorization, copayment issues, initial counseling and start date.  Leron Croak, PharmD, BCPS Hematology/Oncology Clinical Pharmacist [facility] [phone] 09/01/2020 3:52 PM

## 2020-09-01 NOTE — Telephone Encounter (Signed)
Oral Oncology Patient Advocate Encounter  Received notification from [facility] that prior authorization for Tagrisso is required.  PA submitted on CoverMyMeds Key [id number] Status is pending  Oral Oncology Clinic will continue to follow.   [place] Patient [place] Phone [phone] Fax [phone] 09/01/2020 3:44 PM

## 2020-09-01 NOTE — Telephone Encounter (Signed)
Pt decided she wants to start [place]. Message sent to pharmacy and [facility].

## 2020-09-02 ENCOUNTER — Other Ambulatory Visit (HOSPITAL_COMMUNITY): Payer: Self-pay

## 2020-09-02 NOTE — Telephone Encounter (Signed)
Oral Oncology Patient Advocate Encounter  Prior Authorization for [name] has been approved.    PA# [id number] Effective dates: 09/01/20 through 03/03/21  Patients co-pay is $3174.75  Oral Oncology Clinic will continue to follow.    [place] Patient [place] Phone [phone] Fax [phone] 09/02/2020 8:55 AM

## 2020-09-02 NOTE — Progress Notes (Deleted)
[facility] OFFICE PROGRESS NOTE  [name], PA-C [address] [place] [address]  DIAGNOSIS: Recurrent non-small cell lung cancer initially diagnosed as stage Ib (T2 a, N0, M0) non-small cell lung cancer, adenocarcinoma in September 2019.  The patient had bilateral pulmonary nodules including 2 nodules in the left upper lobe and 1 nodule in the right middle lobe in December 2021. Biomarker Findings Microsatellite status - MS-Stable Tumor Mutational Burden - 4 Muts/Mb Genomic Findings For a complete list of the genes assayed, please refer to the Appendix. 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 7 Disease relevant genes with no reportable alterations: ALK, BRAF, ERBB2, KRAS, MET, RET, ROS1  PDL1 Expression: 1%.  PRIOR THERAPY: Status post right lower lobectomy with lymph node dissection on February 10, 2018 under the care of Dr. Servando Snare.  CURRENT THERAPY: Oral targeted chemotherapy with Tagrisso 80 mg p.o. daily. First dose expected on ***   INTERVAL HISTORY: Staphanie [place] Green 70 y.o. female returns to the clinic today for follow-up visit.  The patient was last seen in the clinic on 08/31/2020 by Dr. Earlie Green.  At that appointment, Dr. Earlie Green discussed the mild increase in the pulmonary nodules which was concerning for disease progression.  At that appointment, Dr. Earlie Green recommended the patient start targeted oral chemotherapy with Tagrisso due to her EGFR exon 19 deletion vs. continuing on observation with a restaging CT scan in 3 months.  The patient has considered her options and has decided on starting treatment with Tagrisso.  The patient feels similar from her last appointment.  She reports some mild shortness of breath with exertion but she denies any fever, chills, night sweats, or weight loss.  She denies any chest pain, hemoptysis, or significant cough.  She denies any nausea, vomiting, diarrhea, or  constipation.  She denies any headache or visual changes.  The patient is here today for evaluation and to discuss her treatment in more detail.  MEDICAL HISTORY: Past Medical History:  Diagnosis Date  . Allergy   . Back pain   . Cancer ([place])   . Hypertension   . Hypertension   . Lung mass   . Wears dentures   . Wears glasses     ALLERGIES:  is allergic to metoprolol and penicillin g.  MEDICATIONS:  Current Outpatient Medications  Medication Sig Dispense Refill  . doxazosin (CARDURA) 2 MG tablet Take 2 mg by mouth daily.    Marland Kitchen ibuprofen (ADVIL) 600 MG tablet Take 1 tablet (600 mg total) by mouth every 6 (six) hours as needed. 30 tablet 0  . latanoprost (XALATAN) 0.005 % ophthalmic solution     . osimertinib mesylate (TAGRISSO) 80 MG tablet Take 1 tablet (80 mg total) by mouth daily. 30 tablet 1   No current facility-administered medications for this visit.    SURGICAL HISTORY:  Past Surgical History:  Procedure Laterality Date  . COLONOSCOPY W/ BIOPSIES AND POLYPECTOMY    . MULTIPLE TOOTH EXTRACTIONS    . VIDEO ASSISTED THORACOSCOPY (VATS)/WEDGE RESECTION Right 02/14/2018   Procedure: RIGHT  VIDEO ASSISTED THORACOSCOPY (VATS) WITH RIGHT LOWER LOBE (RLL) WEDGE RESECTION, COMPLETION RLL LOBECTOMY, LYMPH NODE DISSECTION, ON-Q PLACEMENT;  Surgeon: Grace Isaac, MD;  Location: [place];  Service: Thoracic;  Laterality: Right;  Marland Kitchen VIDEO BRONCHOSCOPY [place] 02/14/2018   Procedure: VIDEO BRONCHOSCOPY;  Surgeon: Grace Isaac, MD;  Location: [facility] OR;  Service: Thoracic;  Laterality: [place];    REVIEW OF SYSTEMS:  Review of Systems  Constitutional: Negative for appetite change, chills, fatigue, fever and unexpected weight change.  HENT:   Negative for mouth sores, nosebleeds, sore throat and trouble swallowing.   Eyes: Negative for eye problems and icterus.  Respiratory: Negative for cough, hemoptysis, shortness of breath and wheezing.   Cardiovascular: Negative for chest pain and leg  swelling.  Gastrointestinal: Negative for abdominal pain, constipation, diarrhea, nausea and vomiting.  Genitourinary: Negative for bladder incontinence, difficulty urinating, dysuria, frequency and hematuria.   Musculoskeletal: Negative for back pain, gait problem, neck pain and neck stiffness.  Skin: Negative for itching and rash.  Neurological: Negative for dizziness, extremity weakness, gait problem, headaches, light-headedness and seizures.  Hematological: Negative for adenopathy. Does not bruise/bleed easily.  Psychiatric/Behavioral: Negative for confusion, depression and sleep disturbance. The patient is not nervous/anxious.     PHYSICAL EXAMINATION:  There were no vitals taken for this visit.  ECOG PERFORMANCE STATUS: {CHL ONC ECOG [id number]  Physical Exam  Constitutional: Oriented to person, place, and time and well-developed, well-nourished, and in no distress. No distress.  HENT:  Head: Normocephalic and atraumatic.  Mouth/Throat: Oropharynx is clear and moist. No oropharyngeal exudate.  Eyes: Conjunctivae are normal. Right eye exhibits no discharge. Left eye exhibits no discharge. No scleral icterus.  Neck: Normal range of motion. Neck supple.  Cardiovascular: Normal rate, regular rhythm, normal heart sounds and intact distal pulses.   Pulmonary/Chest: Effort normal and breath sounds normal. No respiratory distress. No wheezes. No rales.  Abdominal: Soft. Bowel sounds are normal. Exhibits no distension and no mass. There is no tenderness.  Musculoskeletal: Normal range of motion. Exhibits no edema.  Lymphadenopathy:    No cervical adenopathy.  Neurological: Alert and oriented to person, place, and time. Exhibits normal muscle tone. Gait normal. Coordination normal.  Skin: Skin is warm and dry. No rash noted. Not diaphoretic. No erythema. No pallor.  Psychiatric: Mood, memory and judgment normal.  Vitals reviewed.  LABORATORY DATA: Lab Results  Component Value Date    WBC 3.7 (L) 08/28/2020   HGB 12.7 08/28/2020   HCT 39.5 08/28/2020   MCV 90.6 08/28/2020   PLT 215 08/28/2020      Chemistry      Component Value Date/Time   NA 145 08/28/2020 0824   K 4.0 08/28/2020 0824   CL 108 08/28/2020 0824   CO2 26 08/28/2020 0824   BUN 14 08/28/2020 0824   CREATININE 0.87 08/28/2020 0824   CREATININE 0.83 08/06/2015 1616      Component Value Date/Time   CALCIUM 8.5 (L) 08/28/2020 0824   ALKPHOS 64 08/28/2020 0824   AST 18 08/28/2020 0824   ALT 18 08/28/2020 0824   BILITOT 0.7 08/28/2020 0824       RADIOGRAPHIC STUDIES:  No results found.   ASSESSMENT/PLAN:  This is a very pleasant 70 year old African-American female with suspicious for recurrent non-small cell lung cancer, adenocarcinoma.  She was initially diagnosed as a stage Ib (T2 a, N0, M0).  She is status post right lower lobectomy with lymph node dissection in September 2019 under the care of Dr. Servando Snare.  The patient had evidence of disease recurrence in December 2021 after presenting with 2 pulmonary nodules in the left upper lobe as well as one nodule in the right middle lobe.  The patient had molecular studies performed by [facility] which showed a positive EGFR mutation with deletion in exon 19.  Her PD-L1 expression is 1%.  The patient was seen today with Dr. Earlie Green.  Dr. Earlie Green once again discussed her options which include starting targeted oral chemotherapy with Tagrisso versus continuing on observation with restaging CT scan in 3 months.  The patient has decided to pursue treatment with Tagrisso.  The patient will start treatment with Tagrisso 80 mg p.o. daily. daily in the next few days. We discussed with her the adverse effect of this treatment and she will receive education and handout from the pharmacist for oral medications. We will have an EKG performed earlier today before starting the treatment to rule out QT prolongation.  We will see the patient back for follow-up  visit in [facility] for evaluation and for repeat blood work and to manage any adverse side effects of treatment.  The patient was advised to call immediately if she has any concerning symptoms in the interval. The patient voices understanding of current disease status and treatment options and is in agreement with the current care plan. All questions were answered. The patient knows to call the clinic with any problems, questions or concerns. We can certainly see the patient much sooner if necessary   No orders of the defined types were placed in this encounter.    I spent {CHL ONC TIME VISIT - MVAEP:[id number]} counseling the patient face to face. The total time spent in the appointment was {CHL ONC TIME VISIT - GREUX:[id number]}.  Taijon Vink L Amila Callies, PA-C 09/02/20

## 2020-09-03 ENCOUNTER — Inpatient Hospital Stay: Payer: Medicare HMO | Admitting: Physician Assistant

## 2020-09-03 ENCOUNTER — Other Ambulatory Visit: Payer: Self-pay | Admitting: Internal Medicine

## 2020-09-03 ENCOUNTER — Other Ambulatory Visit: Payer: Self-pay

## 2020-09-03 VITALS — BP 154/98 | HR 64 | Temp 97.0°F | Ht 64.0 in | Wt 154.8 lb

## 2020-09-03 DIAGNOSIS — Z7189 Other specified counseling: Secondary | ICD-10-CM

## 2020-09-03 DIAGNOSIS — Z5111 Encounter for antineoplastic chemotherapy: Secondary | ICD-10-CM

## 2020-09-03 DIAGNOSIS — C3431 Malignant neoplasm of lower lobe, right bronchus or lung: Secondary | ICD-10-CM | POA: Diagnosis not present

## 2020-09-03 DIAGNOSIS — C3491 Malignant neoplasm of unspecified part of right bronchus or lung: Secondary | ICD-10-CM

## 2020-09-03 NOTE — Progress Notes (Signed)
[facility] OFFICE PROGRESS NOTE  [name], PA-C [address] [place] [address]  DIAGNOSIS: Recurrent non-small cell lung cancer initially diagnosed as stage Ib (T2 a, N0, M0) non-small cell lung cancer, adenocarcinoma in September 2019.  The patient had bilateral pulmonary nodules including 2 nodules in the left upper lobe and 1 nodule in the right middle lobe in December 2021. Biomarker Findings Microsatellite status - MS-Stable Tumor Mutational Burden - 4 Muts/Mb Genomic Findings For a complete list of the genes assayed, please refer to the Appendix. 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 7 Disease relevant genes with no reportable alterations: ALK, BRAF, ERBB2, KRAS, MET, RET, ROS1  PDL1 Expression: 1%.  PRIOR THERAPY: Status post right lower lobectomy with lymph node dissection on February 10, 2018 under the care of Dr. Servando Snare.  CURRENT THERAPY: Tagrisso 80 mg p.o. daily. First dose expected in the next few weeks.   INTERVAL HISTORY: Shelby Green 70 y.o. female returns to the clinic today for follow-up visit.  The patient was last seen in clinic on 08/31/2020.  At that appointment, Dr. Julien Nordmann reviewed the patient's most recent CT scan of the chest which showed some mild increase in pulmonary nodules which was concerning for disease progression.  Dr. Julien Nordmann gave the patient the option of continuing on observation with a restaging scan in 3 months versus starting on systemic targeted oral chemotherapy with Tagrisso.  The patient is given information about Tagrisso and opted to consider her options before making a decision.  The patient has reviewed the information and decided to proceed with Tagrisso. Otherwise, the patient feels fine today without any new concerning complaints.  She endorses some mild shortness of breath with exertion.  She denies any fever, chills, night sweats, weight loss.  She denies any  nausea, vomiting, diarrhea, or constipation.  She denies any headache or visual changes.  She denies any chest pain, cough, or hemoptysis. She is here today for evaluation and for more detailed discussion about her recommended treatment.    MEDICAL HISTORY: Past Medical History:  Diagnosis Date  . Allergy   . Back pain   . Cancer ([place])   . Hypertension   . Hypertension   . Lung mass   . Wears dentures   . Wears glasses     ALLERGIES:  is allergic to metoprolol and penicillin g.  MEDICATIONS:  Current Outpatient Medications  Medication Sig Dispense Refill  . doxazosin (CARDURA) 2 MG tablet Take 2 mg by mouth daily.    Marland Kitchen ibuprofen (ADVIL) 600 MG tablet Take 1 tablet (600 mg total) by mouth every 6 (six) hours as needed. 30 tablet 0  . latanoprost (XALATAN) 0.005 % ophthalmic solution     . osimertinib mesylate (TAGRISSO) 80 MG tablet Take 1 tablet (80 mg total) by mouth daily. 30 tablet 1   No current facility-administered medications for this visit.    SURGICAL HISTORY:  Past Surgical History:  Procedure Laterality Date  . COLONOSCOPY W/ BIOPSIES AND POLYPECTOMY    . MULTIPLE TOOTH EXTRACTIONS    . VIDEO ASSISTED THORACOSCOPY (VATS)/WEDGE RESECTION Right 02/14/2018   Procedure: RIGHT  VIDEO ASSISTED THORACOSCOPY (VATS) WITH RIGHT LOWER LOBE (RLL) WEDGE RESECTION, COMPLETION RLL LOBECTOMY, LYMPH NODE DISSECTION, ON-Q PLACEMENT;  Surgeon: Grace Isaac, MD;  Location: [facility];  Service: Thoracic;  Laterality: Right;  Marland Kitchen VIDEO BRONCHOSCOPY N/A 02/14/2018   Procedure: VIDEO BRONCHOSCOPY;  Surgeon: Grace Isaac, MD;  Location: [facility];  Service: Thoracic;  Laterality: N/A;    REVIEW OF SYSTEMS:   Review of Systems  Constitutional: Negative for appetite change, chills, fatigue, fever and unexpected weight change.  HENT: Negative for mouth sores, nosebleeds, sore throat and trouble swallowing.   Eyes: Negative for eye problems and icterus.  Respiratory: Positive for mild  shortness of breath with exertion. Negative for cough, hemoptysis, and wheezing.   Cardiovascular: Negative for chest pain and leg swelling.  Gastrointestinal: Negative for abdominal pain, constipation, diarrhea, nausea and vomiting.  Genitourinary: Negative for bladder incontinence, difficulty urinating, dysuria, frequency and hematuria.   Musculoskeletal: Negative for back pain, gait problem, neck pain and neck stiffness.  Skin: Negative for itching and rash.  Neurological: Negative for dizziness, extremity weakness, gait problem, headaches, light-headedness and seizures.  Hematological: Negative for adenopathy. Does not bruise/bleed easily.  Psychiatric/Behavioral: Negative for confusion, depression and sleep disturbance. The patient is not nervous/anxious.     PHYSICAL EXAMINATION:  Blood pressure (!) 154/98, pulse 64, temperature (!) 97 F (36.1 C), temperature source Tympanic, height _0  (1.626 m), weight 154 lb 12.8 oz (70.2 kg), SpO2 99 %.  ECOG PERFORMANCE STATUS: 1 - Symptomatic but completely ambulatory  Physical Exam  Constitutional: Oriented to person, place, and time and well-developed, well-nourished, and in no distress.   HENT:  Head: Normocephalic and atraumatic.  Mouth/Throat: Oropharynx is clear and moist. No oropharyngeal exudate.  Eyes: Conjunctivae are normal. Right eye exhibits no discharge. Left eye exhibits no discharge. No scleral icterus.  Neck: Normal range of motion. Neck supple.  Cardiovascular: Normal rate, regular rhythm, normal heart sounds and intact distal pulses.   Pulmonary/Chest: Effort normal and breath sounds normal. No respiratory distress. No wheezes. No rales.  Abdominal: Soft. Bowel sounds are normal. Exhibits no distension and no mass. There is no tenderness.  Musculoskeletal: Normal range of motion. Exhibits no edema.  Lymphadenopathy:    No cervical adenopathy.  Neurological: Alert and oriented to person, place, and time. Exhibits normal  muscle tone. Gait normal. Coordination normal.  Skin: Skin is warm and dry. No rash noted. Not diaphoretic. No erythema. No pallor.  Psychiatric: Mood, memory and judgment normal.  Vitals reviewed.  LABORATORY DATA: Lab Results  Component Value Date   WBC 3.7 (L) 08/28/2020   HGB 12.7 08/28/2020   HCT 39.5 08/28/2020   MCV 90.6 08/28/2020   PLT 215 08/28/2020      Chemistry      Component Value Date/Time   NA 145 08/28/2020 0824   K 4.0 08/28/2020 0824   CL 108 08/28/2020 0824   CO2 26 08/28/2020 0824   BUN 14 08/28/2020 0824   CREATININE 0.87 08/28/2020 0824   CREATININE 0.83 08/06/2015 1616      Component Value Date/Time   CALCIUM 8.5 (L) 08/28/2020 0824   ALKPHOS 64 08/28/2020 0824   AST 18 08/28/2020 0824   ALT 18 08/28/2020 0824   BILITOT 0.7 08/28/2020 0824       RADIOGRAPHIC STUDIES:  No results found.   ASSESSMENT/PLAN:  This is a very pleasant 70 year old African-American female with suspicious for recurrent non-small cell lung cancer.  She was initially diagnosed as a stage Ib (T2 a, N0, M0) non-small cell lung cancer, adenocarcinoma.  She was initially diagnosed in September 2021. The patient had evidence for disease recurrence in December 2021 presenting with 2 pulmonary nodules in the left upper lobe as well as 1 nodule in the right middle lobe.  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  Her PD-L1 expression is 1%.  At the patient's last appointment on 08/31/2020 the results of her restaging CT scan were discussed.  The scan showed evidence of disease recurrence with a mild increase in size of the pulmonary nodules.  Dr. Earlie Server had discussed starting treatment with Tagrisso due to her EGFR mutation.  She had wanted to consider her options before making decision.  The patient has thought about her decision and would like to move forward with targeted treatment with Tagrisso 80 mg p.o.  daily.  Dr. Julien Nordmann discussed with the patient the adverse side effects of treatment including but not limited to diarrhea, prolonged QT, and rashes and skin changes.  She will have a formal teaching class by the oral chemotherapy pharmacist early next week.  She was also given a handout on patient education.  The patient had a baseline EKG performed today which shows a normal QTC.  We will see the patient back for follow-up visit in 3 weeks for evaluation and repeat CBC and CMP.  The patient was instructed to start taking the medication once it is delivered.  The patient was advised to call immediately if she has any concerning symptoms in the interval. The patient voices understanding of current disease status and treatment options and is in agreement with the current care plan. All questions were answered. The patient knows to call the clinic with any problems, questions or concerns. We can certainly see the patient much sooner if necessary       Orders Placed This Encounter  Procedures  . EKG 12-Lead      Alyssha Housh L Gracelee Stemmler, PA-C 09/03/20  ADDENDUM: Hematology/Oncology Attending: I had a face-to-face encounter with the patient.  I reviewed her records and recommended her care plan.  This is a very pleasant 70 years old African-American female with recurrent non-small cell lung cancer that was initially diagnosed as a stage Ib (T2a, N0, M0) adenocarcinoma in September 2019 but the patient also had bilateral pulmonary nodules including 2 nodules in the left upper lobe and one nodule in the right middle lobe in December 2021.  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  PD-L1 expression was 1%. The patient has been followed closely with imaging studies and the last CT scan of the chest showed further progression of her disease.  She was giving the option of continuous observation and monitoring versus proceeding with treatment with targeted  therapy with Tagrisso 80 mg p.o. daily. The patient took some time to think about her option and she came today for reevaluation and consideration of the treatment with Tagrisso. I discussed the treatment plan as well as the adverse effect of Tagrisso with the patient including but not limited to skin rash, diarrhea, interstitial lung disease, dry skin, cardiac or liver dysfunction. The patient is interested in proceeding with the treatment. She will have EKG as a baseline today. We will refer the patient to the pharmacist for oncolytic medication to help her with the education as well as insurance coverage. We will see the patient back for follow-up visit in around 3 weeks for evaluation and repeat blood work for close monitoring of her condition and management of any adverse effect of her treatment. The patient was advised to call immediately if she has any other concerning symptoms in the interval. The total time spent in the appointment was 30 minutes. Disclaimer: This  note was dictated with voice recognition software. Similar sounding words can inadvertently be transcribed and may be missed upon review. Eilleen Kempf, MD 09/04/20

## 2020-09-03 NOTE — Progress Notes (Signed)
START ON PATHWAY REGIMEN - Non-Small Cell Lung     A cycle is every 28 days:     Osimertinib   **Always confirm dose/schedule in your pharmacy ordering system**  Patient Characteristics: Stage IV Metastatic, Nonsquamous, Molecular Analysis Completed, Molecular Alteration Present and Eligible for Molecular Targeted Therapy, Initial Molecular Targeted Therapy, EGFR Mutation - Common (Exon 19 Deletion or Exon 21 L858R Substitution) Therapeutic Status: Stage IV Metastatic Histology: Nonsquamous Cell Broad Molecular Profiling Status: Molecular Analysis Completed Molecular Analysis Results: Alteration Present and Eligible for Molecular Targeted Therapy Molecular Alteration Present: EGFR Mutation - Common (Exon 19 Deletion or Exon 21 L858R Substitution) Molecular Targeted Line of Therapy: Initial Molecular Targeted Therapy Intent of Therapy: Non-Curative / Palliative Intent, Discussed with Patient 

## 2020-09-03 NOTE — Patient Instructions (Signed)
Osimertinib oral tablets What is this medicine? Osimertinib (OH sim ER ti nib) is a medicine that targets proteins in cancer cells and stops the cancer cells from growing. It is used to treat non-small cell lung cancer. This medicine may be used for other purposes; ask your health care provider or pharmacist if you have questions. COMMON BRAND NAME(S): Tagrisso What should I tell my health care provider before I take this medicine? They need to know if you have any of these conditions:  heart disease  history of irregular heartbeat  history of low levels of calcium, magnesium, or potassium in the blood  lung or breathing disease, like asthma  QT prolongation  scarring or thickening of the lungs  an unusual or allergic reaction to osimertinib, other medicines, foods, dyes, or preservatives  pregnant or trying to get pregnant  breast-feeding How should I use this medicine? Take osimertinib tablets by mouth with a glass of water. You can take it with or without food. Follow the directions on the prescription label. Take your medicine at regular intervals. Do not take it more often than directed. Do not stop taking except on your doctor's advice. If swallowing is difficult, you can take the tablet by placing your dose in a container with 2 ounces of cool water only. Stir until the tablet is in small pieces. The tablet will not completely dissolve. Do not crush or chew. Drink the water and the tablet pieces right away. Then add 4 to 8 ounces of water to the same container and drink to make sure you take your full dose. Talk to your pediatrician regarding the use of this medicine in children. Special care may be needed. Overdosage: If you think you have taken too much of this medicine contact a poison control center or emergency room at once. NOTE: This medicine is only for you. Do not share this medicine with others. What if I miss a dose? If you miss a dose, do not make up for the missed  dose. Take your next dose at your regular time. Do not take extra or double doses. What may interact with this medicine? Do not take this medicine with any of the following medications:  cisapride  dronedarone  pimozide  thioridazine This medicine may interact with the following medications:  certain medicines for seizures like carbamazepine, phenobarbital, phenytoin  other medicines that prolong the QT interval (cause an abnormal heart rhythm) like dofetilide, ziprasidone  rifampin  rosuvastatin  St. John's Wort  sulfasalazine  topotecan This list may not describe all possible interactions. Give your health care provider a list of all the medicines, herbs, non-prescription drugs, or dietary supplements you use. Also tell them if you smoke, drink alcohol, or use illegal drugs. Some items may interact with your medicine. What should I watch for while using this medicine? Visit your doctor for regular check ups. Report any side effects. Continue your course of treatment unless your doctor tells you to stop. You will need blood work done while you are taking this medicine. Do not become pregnant while taking this medicine or for 6 weeks after the last dose. Males with female partners of reproductive potential should use effective contraception for 4 months after the last dose. Women should inform their doctor if they wish to become pregnant or think they might be pregnant. There is a potential for serious side effects to an unborn child. This medicine may interfere with the ability to have a child for both men and women.  You should talk with your doctor or health care professional if you are concerned about your fertility. Talk to your health care professional or pharmacist for more information. Do not breast-feed an infant while taking this medicine or for 2 weeks after the last dose. This drug may make you feel generally unwell. This is not uncommon, as chemotherapy can affect healthy  cells as well as cancer cells. Report any side effects. Continue your course of treatment even though you feel ill unless your doctor tells you to stop. What side effects may I notice from receiving this medicine? Side effects that you should report to your doctor or health care professional as soon as possible:  allergic reactions like skin rash, itching or hives, swelling of the face, lips, or tongue  red spots on the skin  redness, blistering, peeling, or loosening of the skin, including inside the mouth  signs and symptoms of a dangerous change in heartbeat or heart rhythm like chest pain; dizziness; fast or irregular heartbeat; palpitations; feeling faint or lightheaded, falls; breathing problems  signs and symptoms of increased potassium like muscle weakness; chest pain; or fast, irregular heartbeat  signs and symptoms of low potassium like muscle cramps or muscle pain; chest pain; dizziness; feeling faint or lightheaded, falls; palpitations; breathing problems; or fast, irregular heartbeat  swelling of the legs or ankles  unusually weak or tired Side effects that usually do not require medical attention (report to your doctor or health care professional if they continue or are bothersome):  diarrhea  dry skin  nail changes This list may not describe all possible side effects. Call your doctor for medical advice about side effects. You may report side effects to FDA at 1-800-FDA-1088. Where should I keep my medicine? Keep out of the reach of children. Store at room temperature between 20 and 25 degrees C (68 and 77 degrees F). Throw away any unused medicine after the expiration date. NOTE: This sheet is a summary. It may not cover all possible information. If you have questions about this medicine, talk to your doctor, pharmacist, or health care provider.  2021 Elsevier/Gold Standard (2019-04-10 13:19:08)

## 2020-09-04 ENCOUNTER — Telehealth: Payer: Self-pay

## 2020-09-04 DIAGNOSIS — Z5111 Encounter for antineoplastic chemotherapy: Secondary | ICD-10-CM | POA: Insufficient documentation

## 2020-09-04 NOTE — Telephone Encounter (Signed)
Oral Oncology Patient Advocate Encounter  Met patient in [place] to complete application for [place] [employer] [place] Patient Assistance Program in an effort to reduce the patient's out of pocket expense for Tagrisso to $0.    Application completed and faxed to [phone].   [place] patient assistance phone number for follow up is [phone].   This encounter will be updated until final determination.  [place] Patient Shelby Green Phone [phone] Fax [phone] 09/04/2020 11:35 AM

## 2020-09-10 ENCOUNTER — Telehealth: Payer: Self-pay | Admitting: Medical Oncology

## 2020-09-10 ENCOUNTER — Other Ambulatory Visit: Payer: Self-pay | Admitting: Medical Oncology

## 2020-09-10 DIAGNOSIS — C3491 Malignant neoplasm of unspecified part of right bronchus or lung: Secondary | ICD-10-CM

## 2020-09-10 MED ORDER — OSIMERTINIB MESYLATE 80 MG PO TABS: 80.0000 mg | ORAL_TABLET | Freq: Every day | ORAL | 1 refills | Status: DC

## 2020-09-10 NOTE — Telephone Encounter (Signed)
Oral Oncology Pharmacist Encounter  Received notification from [place] Patient Assistance Program that patient has been successfully enrolled into their program to receive Tagrisso from the manufacturer at $0 out of pocket until 05/22/21.  Specialty Pharmacy that will dispense medication is [facility] in [place].   Patient notified to call [phone] to set up medication shipment. Estimated delivery time per [employer] is 7 -10 business days.   Leron Croak, PharmD, BCPS Hematology/Oncology Clinical Pharmacist [facility] [phone] 09/10/2020 12:51 PM

## 2020-09-10 NOTE — Telephone Encounter (Signed)
Oral Chemotherapy Pharmacist Encounter   Spoke with patient today to follow up regarding patient's oral chemotherapy medication: Tagrisso (osimertinib)  Notified patient that she has been approved for assistance through [employer] through 05/22/21. Patient provided with phone number to set up medication shipment [phone]  Patient currently at work and requests a call back tomorrow to discuss specific details of medication. Will follow-up with patient on 09/11/20 for initial counseling session.   Leron Croak, PharmD, BCPS Hematology/Oncology Clinical Pharmacist [facility] [phone] 09/10/2020 1:13 PM

## 2020-09-10 NOTE — Telephone Encounter (Signed)
Tagrisso update- Pt does not have any Tagrisso .Stated she received an authorization letter approving her Tagrisso from the assistance program.  Please let her know when it is ordered and what is the pharmacy.that will send it.

## 2020-09-11 NOTE — Telephone Encounter (Signed)
Oral Chemotherapy Pharmacist Encounter  I spoke with patient for overview of: Tagrisso for the adjuvant treatment of non-small cell lung cancer, EGFR exon 19 deletion mutation positive, planned duration until disease progression or unacceptable drug toxicity or for up to 3 years.  Counseled patient on administration, dosing, side effects, monitoring, drug-food interactions, safe handling, storage, and disposal.  Patient will take Tagrisso 80 tablets, 1 tablet by mouth once daily, without regard to food.  Tagrisso start date: patient will start Tagrisso once obtained from mfg - estimated time frame 7-10 business days  Adverse effects include but are not limited to: diarrhea, mouth sores, decreased appetitie, fatigue, dry skin, rash, nail changes, altered cardiac conduction, and decreased blood counts or electrolytes.   Patient will obtain anti diarrheal and alert the office of 4 or more loose stools above baseline.   Reviewed with patient importance of keeping a medication schedule and plan for any missed doses. No barriers to medication adherence identified.  Medication reconciliation performed and medication/allergy list updated.  Insurance authorization for [name] has been obtained. Test claim at the pharmacy revealed copayment $3174.75 for 1st fill of Tagrisso. This is unaffordable to patient - she has been approved to receive medication at no cost until 05/22/21 through [employer] patient assistance program.   All questions answered.  [name] voiced understanding and appreciation.   Medication education handout placed in mail for patient. Patient knows to call the office with questions or concerns. Oral Chemotherapy Clinic phone number provided to patient.   Leron Croak, PharmD, BCPS Hematology/Oncology Clinical Pharmacist [facility] [phone] 09/11/2020 11:56 AM

## 2020-10-12 ENCOUNTER — Other Ambulatory Visit: Payer: Self-pay | Admitting: Thoracic Surgery (Cardiothoracic Vascular Surgery)

## 2020-10-12 DIAGNOSIS — C3491 Malignant neoplasm of unspecified part of right bronchus or lung: Secondary | ICD-10-CM

## 2020-10-22 ENCOUNTER — Telehealth: Payer: Self-pay | Admitting: Medical Oncology

## 2020-10-22 ENCOUNTER — Telehealth: Payer: Self-pay | Admitting: Internal Medicine

## 2020-10-22 NOTE — Telephone Encounter (Signed)
Scheduled appts per 6/2 sch msg. Pt aware.  

## 2020-10-22 NOTE — Telephone Encounter (Signed)
Left message to return call.   I resent Cassie message from 04/14 to scheduling for next appts.

## 2020-11-05 ENCOUNTER — Inpatient Hospital Stay (HOSPITAL_BASED_OUTPATIENT_CLINIC_OR_DEPARTMENT_OTHER): Payer: Medicare HMO | Admitting: Physician Assistant

## 2020-11-05 ENCOUNTER — Other Ambulatory Visit: Payer: Self-pay

## 2020-11-05 ENCOUNTER — Telehealth: Payer: Self-pay

## 2020-11-05 ENCOUNTER — Ambulatory Visit: Payer: Medicare HMO | Admitting: Physician Assistant

## 2020-11-05 ENCOUNTER — Telehealth: Payer: Self-pay | Admitting: Physician Assistant

## 2020-11-05 ENCOUNTER — Inpatient Hospital Stay: Payer: Medicare HMO | Attending: Internal Medicine

## 2020-11-05 ENCOUNTER — Encounter: Payer: Self-pay | Admitting: Physician Assistant

## 2020-11-05 VITALS — BP 133/92 | HR 93 | Temp 98.0°F | Resp 19 | Ht 64.0 in | Wt 148.3 lb

## 2020-11-05 DIAGNOSIS — C3490 Malignant neoplasm of unspecified part of unspecified bronchus or lung: Secondary | ICD-10-CM | POA: Diagnosis not present

## 2020-11-05 DIAGNOSIS — E860 Dehydration: Secondary | ICD-10-CM | POA: Insufficient documentation

## 2020-11-05 DIAGNOSIS — C3431 Malignant neoplasm of lower lobe, right bronchus or lung: Secondary | ICD-10-CM | POA: Diagnosis present

## 2020-11-05 DIAGNOSIS — R197 Diarrhea, unspecified: Secondary | ICD-10-CM | POA: Insufficient documentation

## 2020-11-05 DIAGNOSIS — C3491 Malignant neoplasm of unspecified part of right bronchus or lung: Secondary | ICD-10-CM

## 2020-11-05 LAB — CMP (CANCER CENTER ONLY)
ALT: 13 U/L (ref 0–44)
AST: 17 U/L (ref 15–41)
Albumin: 3.5 g/dL (ref 3.5–5.0)
Alkaline Phosphatase: 58 U/L (ref 38–126)
Anion gap: 6 (ref 5–15)
BUN: 17 mg/dL (ref 8–23)
CO2: 25 mmol/L (ref 22–32)
Calcium: 9.1 mg/dL (ref 8.9–10.3)
Chloride: 111 mmol/L (ref 98–111)
Creatinine: 1.18 mg/dL — ABNORMAL HIGH (ref 0.44–1.00)
GFR, Estimated: 50 mL/min — ABNORMAL LOW (ref >60)
Glucose, Bld: 125 mg/dL — ABNORMAL HIGH (ref 70–99)
Potassium: 3.8 mmol/L (ref 3.5–5.1)
Sodium: 142 mmol/L (ref 135–145)
Total Bilirubin: 0.6 mg/dL (ref 0.3–1.2)
Total Protein: 6.4 g/dL — ABNORMAL LOW (ref 6.5–8.1)

## 2020-11-05 LAB — CBC WITH DIFFERENTIAL (CANCER CENTER ONLY)
Abs Immature Granulocytes: 0 10*3/uL (ref 0.00–0.07)
Basophils Absolute: 0 10*3/uL (ref 0.0–0.1)
Basophils Relative: 1 %
Eosinophils Absolute: 0.2 10*3/uL (ref 0.0–0.5)
Eosinophils Relative: 5 %
HCT: 35.5 % — ABNORMAL LOW (ref 36.0–46.0)
Hemoglobin: 11.4 g/dL — ABNORMAL LOW (ref 12.0–15.0)
Immature Granulocytes: 0 %
Lymphocytes Relative: 34 %
Lymphs Abs: 1.1 10*3/uL (ref 0.7–4.0)
MCH: 29 pg (ref 26.0–34.0)
MCHC: 32.1 g/dL (ref 30.0–36.0)
MCV: 90.3 fL (ref 80.0–100.0)
Monocytes Absolute: 0.3 10*3/uL (ref 0.1–1.0)
Monocytes Relative: 10 %
Neutro Abs: 1.6 10*3/uL — ABNORMAL LOW (ref 1.7–7.7)
Neutrophils Relative %: 50 %
Platelet Count: 194 10*3/uL (ref 150–400)
RBC: 3.93 MIL/uL (ref 3.87–5.11)
RDW: 14.1 % (ref 11.5–15.5)
WBC Count: 3.2 10*3/uL — ABNORMAL LOW (ref 4.0–10.5)
nRBC: 0 % (ref 0.0–0.2)

## 2020-11-05 NOTE — Telephone Encounter (Signed)
I called pt to request she come in for her appts earlier, at about 1pm. Pt agreed.

## 2020-11-05 NOTE — Progress Notes (Signed)
[facility] OFFICE PROGRESS NOTE  [name], PA-C [address] [place] [address]  DIAGNOSIS: Recurrent non-small cell lung cancer initially diagnosed as stage Ib (T2 a, N0, M0) non-small cell lung cancer, adenocarcinoma in September 2019.  The patient had bilateral pulmonary nodules including 2 nodules in the left upper lobe and 1 nodule in the right middle lobe in December 2021. Biomarker Findings Microsatellite status - MS-Stable Tumor Mutational Burden - 4 Muts/Mb Genomic Findings For a complete list of the genes assayed, please refer to the Appendix. 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 7 Disease relevant genes with no reportable alterations: ALK, BRAF, ERBB2, KRAS, MET, RET, ROS1   PDL1 Expression: 1%.  PRIOR THERAPY: Status post right lower lobectomy with lymph node dissection on February 10, 2018 under the care of Dr. Servando Snare.  CURRENT THERAPY: Tagrisso 80 mg p.o. daily. First dose 09/12/20  INTERVAL HISTORY: Nadalyn Lakaisha Danish 70 y.o. female returns to the clinic today for a follow up visit. The patient was last seen in the clinic on 09/03/20. The patient was supposed to follow up in 2-3 weeks which was not scheduled for unknown reasons. The patient started her first dose of Tagrisso on 09/12/2020. She has been tolerating the medication and has continued to take it although has been experiencing a variety of symptoms and worsening of prior symptoms. The patient reports ongoing moderate fatigue and transitioned into taking the medication at night which provided mild improvement. She believes her BP medication doxazosin may also be impacting her fatigue. She reports a poor appetite and taste changes which are worse since starting the mediation. She reports a sensation of a lump in her throat although she denies any issues with swallowing. She reports ongoing diarrhea since starting Tagrisso which occurs more days of  the week than not with varying occurences. She reports having diarrhea throughout the entire day only once and denies any blood in her stools. She has noticed certain foods that cause her diarrhea to be worse including lettuce and [place] food. She has not been taking any Imodium and states she has been letting it run its course. She reports some nausea with greasy foods and strong perfumes but denies any vomiting. She reports an ongoing pain in back, across her right side where she had prior surgery, and along her right hip. Her cough is stable without hemoptysis, and she reports mild dyspnea with exertion as she is able to continue to work at [employer] and complete all of her daily tasks. She reports general cold intolerance although her legs have felt hot, intermittent headaches, and intermittent numbness/tingling along her fingertips which has all predated her start of [place]. She states these symptoms are worse since starting the medication. She states she has chills and daily night sweats that were also present before but are worse now. She also has predated pain in both of her hands. She visited her eye doctor today and denies any vision changes although she reports some balance issues and dizziness. She reports a rash on her lower left leg for which she applied Vaseline and cream which improved. She also had blisters along the side of multiple fingers during the interval which improved with daily ointment. She is here today for evaluation and repeat lab work.   MEDICAL HISTORY: Past Medical History:  Diagnosis Date   Allergy    Back pain    Cancer ([place])    Hypertension  Hypertension    Lung mass    Wears dentures    Wears glasses     ALLERGIES:  is allergic to metoprolol and penicillin g.  MEDICATIONS:  Current Outpatient Medications  Medication Sig Dispense Refill   doxazosin (CARDURA) 2 MG tablet Take 2 mg by mouth daily.     latanoprost (XALATAN) 0.005 % ophthalmic solution       osimertinib mesylate (TAGRISSO) 80 MG tablet Take 1 tablet (80 mg total) by mouth daily. Faxed to [place] and [phone].    30 tablet 1   ibuprofen (ADVIL) 600 MG tablet Take 1 tablet (600 mg total) by mouth every 6 (six) hours as needed. (Patient not taking: Reported on 11/05/2020) 30 tablet 0   No current facility-administered medications for this visit.    SURGICAL HISTORY:  Past Surgical History:  Procedure Laterality Date   COLONOSCOPY W/ BIOPSIES AND POLYPECTOMY     MULTIPLE TOOTH EXTRACTIONS     VIDEO ASSISTED THORACOSCOPY (VATS)/WEDGE RESECTION Right 02/14/2018   Procedure: RIGHT  VIDEO ASSISTED THORACOSCOPY (VATS) WITH RIGHT LOWER LOBE (RLL) WEDGE RESECTION, COMPLETION RLL LOBECTOMY, LYMPH NODE DISSECTION, ON-Q PLACEMENT;  Surgeon: Grace Isaac, MD;  Location: [facility];  Service: Thoracic;  Laterality: Right;   VIDEO BRONCHOSCOPY N/A 02/14/2018   Procedure: VIDEO BRONCHOSCOPY;  Surgeon: Grace Isaac, MD;  Location: [facility];  Service: Thoracic;  Laterality: N/A;    REVIEW OF SYSTEMS:   Review of Systems  Constitutional: Positive for appetite changes, chills, and fatigue. Negative for fever and unexpected weight change.  HENT: Negative for mouth sores, nosebleeds, sore throat and trouble swallowing.   Eyes: Negative for eye problems and icterus.  Respiratory: Positive for cough and shortness of breath. Negative for hemoptysis and wheezing.   Cardiovascular: Negative for chest pain and leg swelling.  Gastrointestinal: Positive for diarrhea and nausea. Negative for abdominal pain, constipation, and vomiting.  Genitourinary: Negative for bladder incontinence, difficulty urinating, dysuria, frequency and hematuria.   Musculoskeletal: Positive for back pain and rib pain. Negative for gait problem, neck pain and neck stiffness.  Skin: Positive for a previous rash and blisters on her fingers (resolved). Negative for itching. Neurological: Positive for dizziness and headaches.  Negative for extremity weakness, gait problem, light-headedness and seizures.  Hematological: Negative for adenopathy. Does not bruise/bleed easily.  Psychiatric/Behavioral: Positive for some sleep disturbance. Negative for confusion, depression. The patient is not nervous/anxious.     PHYSICAL EXAMINATION:  Blood pressure (!) 133/92, pulse 93, temperature 98 F (36.7 C), temperature source Tympanic, resp. rate 19, height _0  (1.626 m), weight 148 lb 4.8 oz (67.3 kg), SpO2 100 %.  ECOG PERFORMANCE STATUS: 1  Physical Exam  Constitutional: Oriented to person, place, and time and well-developed, well-nourished, and in no distress.   HENT:  Head: Normocephalic and atraumatic.  Mouth/Throat: Oropharynx is clear and moist. No oropharyngeal exudate.  Eyes: Conjunctivae are normal. Right eye exhibits no discharge. Left eye exhibits no discharge. No scleral icterus.  Neck: Normal range of motion. Neck supple.  Cardiovascular: Normal rate, regular rhythm, normal heart sounds and intact distal pulses.   Pulmonary/Chest: Effort normal. Quiet breath sounds in both lung bilaterally. No respiratory distress. No wheezes. No rales.  Abdominal: Soft. Bowel sounds are normal. Exhibits no distension and no mass. There is no tenderness.  Musculoskeletal: Normal range of motion. Exhibits no edema.  Lymphadenopathy:    No cervical adenopathy.  Neurological: Alert and oriented to person, place, and time. Exhibits normal muscle tone. Gait  normal. Coordination normal.  Skin: Skin is warm and dry. No rash noted. Not diaphoretic. No erythema. No pallor.  Psychiatric: Mood, memory and judgment normal.  Vitals reviewed.  LABORATORY DATA: Lab Results  Component Value Date   WBC 3.2 (L) 11/05/2020   HGB 11.4 (L) 11/05/2020   HCT 35.5 (L) 11/05/2020   MCV 90.3 11/05/2020   PLT 194 11/05/2020      Chemistry      Component Value Date/Time   NA 142 11/05/2020 1257   K 3.8 11/05/2020 1257   CL 111  11/05/2020 1257   CO2 25 11/05/2020 1257   BUN 17 11/05/2020 1257   CREATININE 1.18 (H) 11/05/2020 1257   CREATININE 0.83 08/06/2015 1616      Component Value Date/Time   CALCIUM 9.1 11/05/2020 1257   ALKPHOS 58 11/05/2020 1257   AST 17 11/05/2020 1257   ALT 13 11/05/2020 1257   BILITOT 0.6 11/05/2020 1257       RADIOGRAPHIC STUDIES:  No results found.   ASSESSMENT/PLAN:  This is a very pleasant 70 year old African-American female with suspicious for recurrent non-small cell lung cancer.  She was initially diagnosed as a stage Ib (T2 a, N0, M0) non-small cell lung cancer, adenocarcinoma.  She was initially diagnosed in September 2021. The patient had evidence for disease recurrence in December 2021 presenting with 2 pulmonary nodules in the left upper lobe as well as 1 nodule in the right middle lobe.   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  Her PD-L1 expression is 1%.  The patient is currently undergoing targetted treatment with Tagrisso 80 mg p.o. daily. She started this on 09/12/20. She is tolerating this fair except for intermittent rashes, fatigue, and diarrhea.   The patient was seen with Dr. Julien Nordmann today. Labs were reviewed. She was encouraged to increase her oral intake for mild dehydration. Recommend that she continue on the same treatment on the same dose.   I will arrange for a restaging CT scan of the chest, abdomen, and pelvis since he is having right hip and flank pain. This is scheduled for 11/27/20.  We will see her back for a follow up visit around 11/30/20 for evaluation and to review her scan.   She was encouraged to use imodium for diarrhea if needed.   The patient was advised to call immediately if she has any concerning symptoms in the interval. The patient voices understanding of current disease status and treatment options and is in agreement with the current care plan. All questions were  answered. The patient knows to call the clinic with any problems, questions or concerns. We can certainly see the patient much sooner if necessary        Orders Placed This Encounter  Procedures   CT Abdomen Pelvis W Contrast    Standing Status:   Future    Standing Expiration Date:   11/05/2021    Order Specific Question:   If indicated for the ordered procedure, I authorize the administration of contrast media per Radiology protocol    Answer:   Yes    Order Specific Question:   Preferred imaging location?    Answer:   [facility]    Order Specific Question:   Is Oral Contrast requested for this exam?    Answer:   Yes, Per Radiology protocol   CBC with Differential ([place] Only)    Standing Status:   Future  Standing Expiration Date:   11/05/2021   CMP ([facility] only)    Standing Status:   Future    Standing Expiration Date:   11/05/2021       Tobe Sos Leshonda Galambos, PA-C 11/05/20  ADDENDUM: Hematology/Oncology Attending: I had a face-to-face encounter with the patient.  I reviewed her records, lab and recommended her care plan.  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 The patient started treatment with targeted therapy with Tagrisso 80 mg p.o. daily status post 2 months of treatment.  She has been tolerating her treatment well except for fatigue and as well as occasional diarrhea but no significant skin rash. I recommended for the patient to continue her treatment with Tagrisso as planned. We will see her back for follow-up visit in around 3 weeks for evaluation with repeat CT scan of the chest, abdomen pelvis for restaging of her disease. The patient was advised to call  immediately if she has any concerning symptoms in the interval.  Disclaimer: This note was dictated with voice recognition software. Similar sounding words can inadvertently be transcribed and may be missed upon review. Eilleen Kempf, MD 11/06/20

## 2020-11-05 NOTE — Telephone Encounter (Signed)
Scheduled per los. Gave avs and calendar  

## 2020-11-06 ENCOUNTER — Encounter: Payer: Self-pay | Admitting: Internal Medicine

## 2020-11-13 ENCOUNTER — Other Ambulatory Visit: Payer: Medicare HMO

## 2020-11-13 ENCOUNTER — Ambulatory Visit: Payer: Medicare HMO | Admitting: Thoracic Surgery (Cardiothoracic Vascular Surgery)

## 2020-11-16 ENCOUNTER — Other Ambulatory Visit: Payer: Self-pay | Admitting: Medical Oncology

## 2020-11-16 ENCOUNTER — Inpatient Hospital Stay: Payer: Medicare HMO

## 2020-11-16 ENCOUNTER — Inpatient Hospital Stay: Payer: Medicare HMO | Admitting: Internal Medicine

## 2020-11-16 DIAGNOSIS — C3491 Malignant neoplasm of unspecified part of right bronchus or lung: Secondary | ICD-10-CM

## 2020-11-16 MED ORDER — OSIMERTINIB MESYLATE 80 MG PO TABS: 80.0000 mg | ORAL_TABLET | Freq: Every day | ORAL | 1 refills | Status: DC

## 2020-11-16 NOTE — Addendum Note (Signed)
Addended by: Ardeen Garland on: 11/16/2020 02:54 PM   Modules accepted: Orders

## 2020-11-16 NOTE — Telephone Encounter (Signed)
Requested refill for Tagrisso. Refill sent.

## 2020-11-17 ENCOUNTER — Ambulatory Visit: Payer: Medicare HMO | Admitting: Dietician

## 2020-11-17 NOTE — Progress Notes (Signed)
Nutrition Assessment   Reason for Assessment: MST   ASSESSMENT: 70 year old female with recurrent non-small cell lung cancer. Patient is s/p right lower lobectomy on 02/10/18. She is receiving oral chemotherapy with Tagrissio. Patient followed by Dr. Julien Nordmann.   Spoke with patient via telephone this morning. Introduced self and services available at [facility]. Patient appreciative of phone call, reports currently at work but able to speak for a few minutes. Patient reports her appetite has been poor due to food tasting "blah" and having "cotton mouth" She says cold fruits like watermelon taste really good to her. Patient reports nausea when eating fried chicken, she tolerates baked chicken. Patient had a chicken caesar salad last night, says it gave her diarrhea. She is unable to eat chocolate now due to diarrhea. Patient tried Ensure once but did not like it.     Medications: Tagrisso   Labs: (6/16) Glucose 125, Cr 1.18   Anthropometrics:   Height: 5'4" Weight: 148 lb 4.8 oz (6/16) UBW: 152-154 lb (April-Dec 2021) BMI: 25.46    NUTRITION DIAGNOSIS: Unintentional weight loss related to cancer and associated treatments as evidenced by reported altered taste of foods, nausea, diarrhea and 4.5% (7 lb) weight loss in 2 months. This is not significant for time frame    INTERVENTION:  Discussed strategies for altered taste - will mail handout Discussed tips for nausea, foods to limit/best tolerated - will mail handout Discussed tips for diarrhea, foods to limit/best tolerated - will mail handout Recommend baking soda/salt water rinses several times daily - will mail oral care handout with recipe Recommended daily intake of nutrition supplement Will leave samples at registration desk for pt to try for at next appt on July 8 Contact information will be provided   MONITORING, EVALUATION, GOAL: Patient will tolerate adequate calories and protein to minimize weight loss   Next Visit: f/u in  clinic Tuesday, July 12

## 2020-11-20 ENCOUNTER — Ambulatory Visit: Payer: Medicare HMO | Admitting: Thoracic Surgery (Cardiothoracic Vascular Surgery)

## 2020-11-27 ENCOUNTER — Encounter (HOSPITAL_COMMUNITY): Payer: Self-pay | Admitting: Radiology

## 2020-11-27 ENCOUNTER — Encounter (HOSPITAL_COMMUNITY): Payer: Self-pay

## 2020-11-27 ENCOUNTER — Other Ambulatory Visit: Payer: Self-pay

## 2020-11-27 ENCOUNTER — Ambulatory Visit (HOSPITAL_COMMUNITY)
Admission: RE | Admit: 2020-11-27 | Discharge: 2020-11-27 | Disposition: A | Discharge status: Home / Self Care | Payer: Medicare HMO | Source: Ambulatory Visit | Attending: Internal Medicine | Admitting: Internal Medicine

## 2020-11-27 DIAGNOSIS — C3491 Malignant neoplasm of unspecified part of right bronchus or lung: Secondary | ICD-10-CM

## 2020-11-27 DIAGNOSIS — C3490 Malignant neoplasm of unspecified part of unspecified bronchus or lung: Secondary | ICD-10-CM | POA: Diagnosis present

## 2020-11-27 MED ORDER — IOHEXOL 300 MG/ML  SOLN
100.0000 mL | Freq: Once | INTRAMUSCULAR | Status: AC | PRN
  Administered 2020-11-27: 100 mL via INTRAVENOUS

## 2020-11-27 MED ORDER — SODIUM CHLORIDE (PF) 0.9 % IJ SOLN
INTRAMUSCULAR | Status: AC
  Filled 2020-11-27: qty 50

## 2020-11-30 ENCOUNTER — Other Ambulatory Visit: Payer: Medicare HMO

## 2020-12-01 ENCOUNTER — Other Ambulatory Visit: Payer: Self-pay

## 2020-12-01 ENCOUNTER — Inpatient Hospital Stay: Payer: Medicare HMO | Attending: Internal Medicine | Admitting: Internal Medicine

## 2020-12-01 ENCOUNTER — Encounter: Payer: Self-pay | Admitting: Internal Medicine

## 2020-12-01 ENCOUNTER — Inpatient Hospital Stay: Payer: Medicare HMO

## 2020-12-01 ENCOUNTER — Inpatient Hospital Stay: Payer: Medicare HMO | Admitting: Dietician

## 2020-12-01 VITALS — BP 149/87 | HR 66 | Temp 97.4°F | Resp 19 | Ht 64.0 in | Wt 148.2 lb

## 2020-12-01 DIAGNOSIS — C3431 Malignant neoplasm of lower lobe, right bronchus or lung: Secondary | ICD-10-CM | POA: Diagnosis not present

## 2020-12-01 DIAGNOSIS — Z5111 Encounter for antineoplastic chemotherapy: Secondary | ICD-10-CM

## 2020-12-01 DIAGNOSIS — C3491 Malignant neoplasm of unspecified part of right bronchus or lung: Secondary | ICD-10-CM

## 2020-12-01 LAB — CMP (CANCER CENTER ONLY)
ALT: 27 U/L (ref 0–44)
AST: 27 U/L (ref 15–41)
Albumin: 3.7 g/dL (ref 3.5–5.0)
Alkaline Phosphatase: 60 U/L (ref 38–126)
Anion gap: 8 (ref 5–15)
BUN: 20 mg/dL (ref 8–23)
CO2: 28 mmol/L (ref 22–32)
Calcium: 9.5 mg/dL (ref 8.9–10.3)
Chloride: 107 mmol/L (ref 98–111)
Creatinine: 1.11 mg/dL — ABNORMAL HIGH (ref 0.44–1.00)
GFR, Estimated: 54 mL/min — ABNORMAL LOW (ref >60)
Glucose, Bld: 90 mg/dL (ref 70–99)
Potassium: 4.1 mmol/L (ref 3.5–5.1)
Sodium: 143 mmol/L (ref 135–145)
Total Bilirubin: 0.9 mg/dL (ref 0.3–1.2)
Total Protein: 6.8 g/dL (ref 6.5–8.1)

## 2020-12-01 LAB — CBC WITH DIFFERENTIAL (CANCER CENTER ONLY)
Abs Immature Granulocytes: 0 10*3/uL (ref 0.00–0.07)
Basophils Absolute: 0 10*3/uL (ref 0.0–0.1)
Basophils Relative: 1 %
Eosinophils Absolute: 0.2 10*3/uL (ref 0.0–0.5)
Eosinophils Relative: 5 %
HCT: 35.5 % — ABNORMAL LOW (ref 36.0–46.0)
Hemoglobin: 11.7 g/dL — ABNORMAL LOW (ref 12.0–15.0)
Immature Granulocytes: 0 %
Lymphocytes Relative: 32 %
Lymphs Abs: 1.3 10*3/uL (ref 0.7–4.0)
MCH: 29.2 pg (ref 26.0–34.0)
MCHC: 33 g/dL (ref 30.0–36.0)
MCV: 88.5 fL (ref 80.0–100.0)
Monocytes Absolute: 0.4 10*3/uL (ref 0.1–1.0)
Monocytes Relative: 10 %
Neutro Abs: 2.1 10*3/uL (ref 1.7–7.7)
Neutrophils Relative %: 52 %
Platelet Count: 176 10*3/uL (ref 150–400)
RBC: 4.01 MIL/uL (ref 3.87–5.11)
RDW: 13.8 % (ref 11.5–15.5)
WBC Count: 3.9 10*3/uL — ABNORMAL LOW (ref 4.0–10.5)
nRBC: 0 % (ref 0.0–0.2)

## 2020-12-01 NOTE — Progress Notes (Signed)
Nutrition Follow-up:  Patient with recurrent non-small cell lung cancer. She is s/p right lower lobectomy 02/10/18. She is receiving oral chemotherapy with Tagrisso.  Met with patient in clinic this morning. She reports appetite comes and goes. Patient endorses altered taste, says a lot of foods taste salty. She usually does not eat breakfast, drinks mango flavored hot tea and sometimes has a bowl of cereal. She snacks on tangerines and apples. Patient likes to eat salads, continues to enjoy the taste of cold fruits. Patient often goes all day without eating when at work. Yesterday she had a mocha coffee in the morning and 3 chicken wings with bread for dinner. Patient reports nausea with the smell of perfume and cigarette smoke. She denies constipation. Patient has regular bowel movements daily, reports occasional diarrhea after eating salads or apples.     Medications: reviewed  Labs: Cr 1.11  Anthropometrics: Weight 148 lb 3.2 oz today stable  6/16 - 148 lb 4.8 oz   NUTRITION DIAGNOSIS: Unintentional weight loss stable   INTERVENTION:  Educated on the importance of adequate calorie and protein energy intake to maintain strength, weights, nutrition Discussed strategies for poor appetite, suggested setting alarm on phone/watch for meal/snack time reminder, drinking fluids after eating  Encouraged use of oral nutrition supplements with decreased intake, samples and coupons provided Reviewed strategies for altered taste, pt has handout Encouraged baking soda/salt water rinses several times daily before meals Patient has contact information   MONITORING, EVALUATION, GOAL:  Weight trends, intake   NEXT VISIT: via telephone ~4 weeks

## 2020-12-01 NOTE — Progress Notes (Signed)
[place] [phone]   Fax:([phone]  OFFICE PROGRESS NOTE  [name], PA-C [address] [place] [address]  DIAGNOSIS: Recurrent non-small cell lung cancer initially diagnosed as stage Ib (T2 a, N0, M0) non-small cell lung cancer, adenocarcinoma in September 2019.  The patient had bilateral pulmonary nodules including 2 nodules in the left upper lobe and 1 nodule in the right middle lobe in December 2021. Biomarker Findings Microsatellite status - MS-Stable Tumor Mutational Burden - 4 Muts/Mb Genomic Findings For a complete list of the genes assayed, please refer to the Appendix. 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 7 Disease relevant genes with no reportable alterations: ALK, BRAF, ERBB2, KRAS, MET, RET, ROS1  PDL1 Expression: 1%.  PRIOR THERAPY: Status post right lower lobectomy with lymph node dissection on February 10, 2018 under the care of Dr. Servando Snare.  CURRENT THERAPY: Tagrisso 80 mg p.o. daily.  First dose started on September 12, 2020  INTERVAL HISTORY: Shelby Green 70 y.o. female returns to the clinic today for follow-up visit.  The patient is feeling fine today with no concerning complaints except for spasm in the back of her neck.  She denied having any current chest pain, shortness of breath, cough or hemoptysis.  She denied having any nausea, vomiting, diarrhea or constipation.  She has no headache or visual changes.  She has no recent weight loss or night sweats.  She continues to tolerate her treatment with Tagrisso fairly well.  She denied having any skin rash.  The patient had repeat CT scan of the chest, abdomen pelvis performed recently and she is here for evaluation and discussion of her risk her results.   MEDICAL HISTORY: Past Medical History:  Diagnosis Date   Allergy    Back pain    Cancer ([facility])    Hypertension    Hypertension    Lung cancer ([facility])  01/2018   Lung mass    Wears dentures    Wears glasses     ALLERGIES:  is allergic to metoprolol and penicillin g.  MEDICATIONS:  Current Outpatient Medications  Medication Sig Dispense Refill   doxazosin (CARDURA) 2 MG tablet Take 2 mg by mouth daily.     ibuprofen (ADVIL) 600 MG tablet Take 1 tablet (600 mg total) by mouth every 6 (six) hours as needed. (Patient not taking: Reported on 11/05/2020) 30 tablet 0   latanoprost (XALATAN) 0.005 % ophthalmic solution      osimertinib mesylate (TAGRISSO) 80 MG tablet Take 1 tablet (80 mg total) by mouth daily. Faxed to [facility] and [phone].    30 tablet 1   No current facility-administered medications for this visit.    SURGICAL HISTORY:  Past Surgical History:  Procedure Laterality Date   COLONOSCOPY W/ BIOPSIES AND POLYPECTOMY     MULTIPLE TOOTH EXTRACTIONS     VIDEO ASSISTED THORACOSCOPY (VATS)/WEDGE RESECTION Right 02/14/2018   Procedure: RIGHT  VIDEO ASSISTED THORACOSCOPY (VATS) WITH RIGHT LOWER LOBE (RLL) WEDGE RESECTION, COMPLETION RLL LOBECTOMY, LYMPH NODE DISSECTION, ON-Q PLACEMENT;  Surgeon: Grace Isaac, MD;  Location: [facility];  Service: Thoracic;  Laterality: Right;   VIDEO BRONCHOSCOPY N/A 02/14/2018   Procedure: VIDEO BRONCHOSCOPY;  Surgeon: Grace Isaac, MD;  Location: [facility];  Service: Thoracic;  Laterality: N/A;    REVIEW OF SYSTEMS:  Constitutional: negative Eyes: negative Ears, nose, mouth, throat, and face: negative Respiratory: negative Cardiovascular: negative Gastrointestinal: negative Genitourinary:negative Integument/breast:  negative Hematologic/lymphatic: negative Musculoskeletal:positive for neck pain Neurological: negative Behavioral/Psych: negative Endocrine: negative Allergic/Immunologic: negative   PHYSICAL EXAMINATION: General appearance: alert, cooperative, and no distress Head: Normocephalic, without obvious abnormality, atraumatic Neck: no adenopathy, no JVD, supple, symmetrical,  trachea midline, and thyroid not enlarged, symmetric, no tenderness/mass/nodules Lymph nodes: Cervical, supraclavicular, and axillary nodes normal. Resp: clear to auscultation bilaterally Back: symmetric, no curvature. ROM normal. No CVA tenderness. Cardio: regular rate and rhythm, S1, S2 normal, no murmur, click, rub or gallop GI: soft, non-tender; bowel sounds normal; no masses,  no organomegaly Extremities: extremities normal, atraumatic, no cyanosis or edema Neurologic: Alert and oriented X 3, normal strength and tone. Normal symmetric reflexes. Normal coordination and gait  ECOG PERFORMANCE STATUS: 1 - Symptomatic but completely ambulatory  Blood pressure (!) 149/87, pulse 66, temperature (!) 97.4 F (36.3 C), temperature source Tympanic, resp. rate 19, height '5\' 4"'  (1.626 m), weight 148 lb 3.2 oz (67.2 kg), SpO2 100 %.  LABORATORY DATA: Lab Results  Component Value Date   WBC 3.9 (L) 12/01/2020   HGB 11.7 (L) 12/01/2020   HCT 35.5 (L) 12/01/2020   MCV 88.5 12/01/2020   PLT 176 12/01/2020      Chemistry      Component Value Date/Time   NA 142 11/05/2020 1257   K 3.8 11/05/2020 1257   CL 111 11/05/2020 1257   CO2 25 11/05/2020 1257   BUN 17 11/05/2020 1257   CREATININE 1.18 (H) 11/05/2020 1257   CREATININE 0.83 08/06/2015 1616      Component Value Date/Time   CALCIUM 9.1 11/05/2020 1257   ALKPHOS 58 11/05/2020 1257   AST 17 11/05/2020 1257   ALT 13 11/05/2020 1257   BILITOT 0.6 11/05/2020 1257       RADIOGRAPHIC STUDIES: CT Chest W Contrast  Result Date: 11/27/2020 CLINICAL DATA:  Primary Cancer Type: Lung Imaging Indication: Assess response to therapy Interval therapy since last imaging? Yes Initial Cancer Diagnosis Date: 02/14/2018; Established by: Biopsy-proven Detailed Pathology: Stage Ib non-small cell lung cancer, adenocarcinoma. Primary Tumor location: Right lower lobe. Recurrence? Yes; Date(s) of recurrence: 04/09/2020; Established by: Imaging only  Surgeries: Right lower lobectomy 02/14/2018. Chemotherapy: Yes; Ongoing?  Yes; Tagrisso daily Immunotherapy? No Radiation therapy? No EXAM: CT CHEST, ABDOMEN, AND PELVIS WITH CONTRAST TECHNIQUE: Multidetector CT imaging of the chest, abdomen and pelvis was performed following the standard protocol during bolus administration of intravenous contrast. CONTRAST:  175m OMNIPAQUE IOHEXOL 300 MG/ML  SOLN COMPARISON:  Most recent CT chest 07/16/2020.  04/09/2020 PET-CT. FINDINGS: CT CHEST FINDINGS Cardiovascular: Aortic atherosclerosis. Tortuous thoracic aorta. Mild cardiomegaly, without pericardial effusion. No central pulmonary embolism, on this non-dedicated study. Mediastinum/Nodes: No supraclavicular adenopathy. Tiny bilateral thyroid nodules are of doubtful clinical significance. No mediastinal or hilar adenopathy. Lungs/Pleura: Trace bilateral pleural fluid is not significantly changed. Right-sided pleural thickeninglaterally including at 1.8 x 0.7 cm on 40/2, increased versus only 3 mm in thickness on the prior exam (when remeasured). Present on 09/12/2019. Right lower lobectomy. Mild centrilobular emphysema. The right lung base nodule on the prior exam is resolved. The adjacent left apical pulmonary nodules have essentially resolved. Residual linear scarring remains including on 44 and 46 series 6. Musculoskeletal: No acute osseous abnormality. CT ABDOMEN PELVIS FINDINGS Hepatobiliary: Too small to characterize hypoattenuating right liver lesions are felt to be present on 04/09/2020 PET, favoring tiny cysts. There is also a too small to characterize segment 4 B lesion which is favored to represent a cyst. No overtly suspicious liver lesions. Normal  gallbladder, without biliary ductal dilatation. Pancreas: Normal, without mass or ductal dilatation. Spleen: Normal in size, without focal abnormality. Adrenals/Urinary Tract: Normal adrenal glands. Interpolar left renal 2.3 cm cyst. Too small to characterize lesions  in both kidneys. No hydronephrosis. Normal urinary bladder. Stomach/Bowel: Normal stomach, without wall thickening. Scattered colonic diverticula. Normal colon, appendix, and terminal ileum. Normal small bowel. Vascular/Lymphatic: Aortic atherosclerosis. No abdominopelvic adenopathy. Reproductive: Retroverted uterus. Somewhat globular uterus which is prominent for age, grossly similar to the prior PET. No adnexal mass. Other: Trace pelvic fluid is similar to on the prior PET. No abdominal ascites or free intraperitoneal air. Fat containing ventral abdominal wall hernia is small. Musculoskeletal: Tarlov cysts. IMPRESSION: 1. Status post right lower lobectomy. Decrease in size of bilateral pulmonary nodules, most consistent with response to therapy of metastatic disease. 2. An area of right-sided pleural thickening is slightly more distinct today, but still favored to be benign (given response to therapy elsewhere). Recommend attention on follow-up. 3. Otherwise, no new or progressive disease. 4. Uterine fibroids with similar trace nonspecific cul-de-sac fluid. 5. Similar trace bilateral pleural effusions. 6. Coronary artery atherosclerosis. Aortic Atherosclerosis (ICD10-I70.0). Electronically Signed   By: Abigail Miyamoto M.D.   On: 11/27/2020 10:58   CT Abdomen Pelvis W Contrast  Result Date: 11/27/2020 CLINICAL DATA:  Primary Cancer Type: Lung Imaging Indication: Assess response to therapy Interval therapy since last imaging? Yes Initial Cancer Diagnosis Date: 02/14/2018; Established by: Biopsy-proven Detailed Pathology: Stage Ib non-small cell lung cancer, adenocarcinoma. Primary Tumor location: Right lower lobe. Recurrence? Yes; Date(s) of recurrence: 04/09/2020; Established by: Imaging only Surgeries: Right lower lobectomy 02/14/2018. Chemotherapy: Yes; Ongoing?  Yes; Tagrisso daily Immunotherapy? No Radiation therapy? No EXAM: CT CHEST, ABDOMEN, AND PELVIS WITH CONTRAST TECHNIQUE: Multidetector CT imaging of the  chest, abdomen and pelvis was performed following the standard protocol during bolus administration of intravenous contrast. CONTRAST:  172m OMNIPAQUE IOHEXOL 300 MG/ML  SOLN COMPARISON:  Most recent CT chest 07/16/2020.  04/09/2020 PET-CT. FINDINGS: CT CHEST FINDINGS Cardiovascular: Aortic atherosclerosis. Tortuous thoracic aorta. Mild cardiomegaly, without pericardial effusion. No central pulmonary embolism, on this non-dedicated study. Mediastinum/Nodes: No supraclavicular adenopathy. Tiny bilateral thyroid nodules are of doubtful clinical significance. No mediastinal or hilar adenopathy. Lungs/Pleura: Trace bilateral pleural fluid is not significantly changed. Right-sided pleural thickeninglaterally including at 1.8 x 0.7 cm on 40/2, increased versus only 3 mm in thickness on the prior exam (when remeasured). Present on 09/12/2019. Right lower lobectomy. Mild centrilobular emphysema. The right lung base nodule on the prior exam is resolved. The adjacent left apical pulmonary nodules have essentially resolved. Residual linear scarring remains including on 44 and 46 series 6. Musculoskeletal: No acute osseous abnormality. CT ABDOMEN PELVIS FINDINGS Hepatobiliary: Too small to characterize hypoattenuating right liver lesions are felt to be present on 04/09/2020 PET, favoring tiny cysts. There is also a too small to characterize segment 4 B lesion which is favored to represent a cyst. No overtly suspicious liver lesions. Normal gallbladder, without biliary ductal dilatation. Pancreas: Normal, without mass or ductal dilatation. Spleen: Normal in size, without focal abnormality. Adrenals/Urinary Tract: Normal adrenal glands. Interpolar left renal 2.3 cm cyst. Too small to characterize lesions in both kidneys. No hydronephrosis. Normal urinary bladder. Stomach/Bowel: Normal stomach, without wall thickening. Scattered colonic diverticula. Normal colon, appendix, and terminal ileum. Normal small bowel.  Vascular/Lymphatic: Aortic atherosclerosis. No abdominopelvic adenopathy. Reproductive: Retroverted uterus. Somewhat globular uterus which is prominent for age, grossly similar to the prior PET. No adnexal mass. Other: Trace pelvic fluid  is similar to on the prior PET. No abdominal ascites or free intraperitoneal air. Fat containing ventral abdominal wall hernia is small. Musculoskeletal: Tarlov cysts. IMPRESSION: 1. Status post right lower lobectomy. Decrease in size of bilateral pulmonary nodules, most consistent with response to therapy of metastatic disease. 2. An area of right-sided pleural thickening is slightly more distinct today, but still favored to be benign (given response to therapy elsewhere). Recommend attention on follow-up. 3. Otherwise, no new or progressive disease. 4. Uterine fibroids with similar trace nonspecific cul-de-sac fluid. 5. Similar trace bilateral pleural effusions. 6. Coronary artery atherosclerosis. Aortic Atherosclerosis (ICD10-I70.0). Electronically Signed   By: Abigail Miyamoto M.D.   On: 11/27/2020 10:58    ASSESSMENT AND PLAN: This is a very pleasant 70 years old African-American female with highly suspicious recurrent non-small cell lung cancer, initially diagnosed as a stage Ib (T2 a, N0, M0) non-small cell lung cancer, adenocarcinoma status post right lower lobectomy with lymph node dissection in September 2019 under the care of Dr. Servando Snare.  The patient had evidence for disease recurrence in December 2021 presenting with 2 pulmonary nodules in the left upper lobe as well as 1 nodule in the right middle lobe. 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  Her PD-L1 expression is 1%. The patient was found on previous blood work to have evidence for disease progression with enlarging pulmonary nodules. She started treatment with Tagrisso 80 mg p.o. daily on September 12, 2020 and has been tolerating this treatment well  with no concerning adverse effects. She had repeat CT scan of the chest, abdomen and pelvis performed recently.  I personally and independently reviewed the scan images and discussed the results with the patient today. Her scan showed improvement of her disease with decrease in the size of multiple pulmonary nodules. I recommended for the patient to continue her current treatment with Tagrisso with the same dose. I will see her back for follow-up visit in 1 months for evaluation and repeat blood work. She was advised to call immediately if she has any other concerning symptoms in the interval. The patient voices understanding of current disease status and treatment options and is in agreement with the current care plan.  All questions were answered. The patient knows to call the clinic with any problems, questions or concerns. We can certainly see the patient much sooner if necessary.  Disclaimer: This note was dictated with voice recognition software. Similar sounding words can inadvertently be transcribed and may not be corrected upon review.

## 2020-12-04 ENCOUNTER — Ambulatory Visit: Payer: Medicare HMO | Admitting: Thoracic Surgery (Cardiothoracic Vascular Surgery)

## 2020-12-25 ENCOUNTER — Ambulatory Visit: Payer: Medicare HMO | Admitting: Thoracic Surgery (Cardiothoracic Vascular Surgery)

## 2021-01-04 ENCOUNTER — Inpatient Hospital Stay: Payer: Medicare HMO | Admitting: Internal Medicine

## 2021-01-04 ENCOUNTER — Inpatient Hospital Stay: Payer: Medicare HMO | Attending: Internal Medicine

## 2021-01-04 ENCOUNTER — Other Ambulatory Visit: Payer: Self-pay

## 2021-01-04 VITALS — BP 162/95 | HR 74 | Temp 97.7°F | Resp 18 | Ht 64.0 in | Wt 148.5 lb

## 2021-01-04 DIAGNOSIS — R197 Diarrhea, unspecified: Secondary | ICD-10-CM | POA: Diagnosis not present

## 2021-01-04 DIAGNOSIS — C3491 Malignant neoplasm of unspecified part of right bronchus or lung: Secondary | ICD-10-CM | POA: Diagnosis not present

## 2021-01-04 DIAGNOSIS — C3431 Malignant neoplasm of lower lobe, right bronchus or lung: Secondary | ICD-10-CM | POA: Diagnosis present

## 2021-01-04 DIAGNOSIS — Z5111 Encounter for antineoplastic chemotherapy: Secondary | ICD-10-CM

## 2021-01-04 LAB — CMP (CANCER CENTER ONLY)
ALT: 27 U/L (ref 0–44)
AST: 25 U/L (ref 15–41)
Albumin: 3.8 g/dL (ref 3.5–5.0)
Alkaline Phosphatase: 57 U/L (ref 38–126)
Anion gap: 8 (ref 5–15)
BUN: 16 mg/dL (ref 8–23)
CO2: 27 mmol/L (ref 22–32)
Calcium: 9.1 mg/dL (ref 8.9–10.3)
Chloride: 108 mmol/L (ref 98–111)
Creatinine: 1.15 mg/dL — ABNORMAL HIGH (ref 0.44–1.00)
GFR, Estimated: 52 mL/min — ABNORMAL LOW (ref >60)
Glucose, Bld: 90 mg/dL (ref 70–99)
Potassium: 4.2 mmol/L (ref 3.5–5.1)
Sodium: 143 mmol/L (ref 135–145)
Total Bilirubin: 0.6 mg/dL (ref 0.3–1.2)
Total Protein: 6.6 g/dL (ref 6.5–8.1)

## 2021-01-04 LAB — CBC WITH DIFFERENTIAL (CANCER CENTER ONLY)
Abs Immature Granulocytes: 0 10*3/uL (ref 0.00–0.07)
Basophils Absolute: 0 10*3/uL (ref 0.0–0.1)
Basophils Relative: 1 %
Eosinophils Absolute: 0.2 10*3/uL (ref 0.0–0.5)
Eosinophils Relative: 4 %
HCT: 36.2 % (ref 36.0–46.0)
Hemoglobin: 11.7 g/dL — ABNORMAL LOW (ref 12.0–15.0)
Immature Granulocytes: 0 %
Lymphocytes Relative: 34 %
Lymphs Abs: 1.4 10*3/uL (ref 0.7–4.0)
MCH: 29.5 pg (ref 26.0–34.0)
MCHC: 32.3 g/dL (ref 30.0–36.0)
MCV: 91.4 fL (ref 80.0–100.0)
Monocytes Absolute: 0.3 10*3/uL (ref 0.1–1.0)
Monocytes Relative: 9 %
Neutro Abs: 2.1 10*3/uL (ref 1.7–7.7)
Neutrophils Relative %: 52 %
Platelet Count: 167 10*3/uL (ref 150–400)
RBC: 3.96 MIL/uL (ref 3.87–5.11)
RDW: 14.3 % (ref 11.5–15.5)
WBC Count: 4 10*3/uL (ref 4.0–10.5)
nRBC: 0 % (ref 0.0–0.2)

## 2021-01-04 NOTE — Progress Notes (Signed)
[facility] Telephone:(336) [phone]   Fax:(336) [phone]  OFFICE PROGRESS NOTE  [name], PA-C [address] [place] [address]  DIAGNOSIS: Recurrent non-small cell lung cancer initially diagnosed as stage Ib (T2 a, N0, M0) non-small cell lung cancer, adenocarcinoma in September 2019.  The patient had bilateral pulmonary nodules including 2 nodules in the left upper lobe and 1 nodule in the right middle lobe in December 2021. Biomarker Findings Microsatellite status - MS-Stable Tumor Mutational Burden - 4 Muts/Mb Genomic Findings For a complete list of the genes assayed, please refer to the Appendix. 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 7 Disease relevant genes with no reportable alterations: ALK, BRAF, ERBB2, KRAS, MET, RET, ROS1  PDL1 Expression: 1%.  PRIOR THERAPY: Status post right lower lobectomy with lymph node dissection on February 10, 2018 under the care of Dr. Servando Snare.  CURRENT THERAPY: Tagrisso 80 mg p.o. daily.  First dose started on September 12, 2020.  Status post 4 months of treatment.  INTERVAL HISTORY: Shelby Green 70 y.o. female returns to the clinic today for follow-up visit accompanied by friend.  The patient is feeling fine today with no concerning complaints except for mild fatigue.  She denied having any chest pain, shortness of breath, cough or hemoptysis.  She has occasional episodes of diarrhea but does not require treatment.  She denied having any nausea, vomiting or constipation.  She has no recent weight loss or night sweats.  She has no headache or visual changes.  She complains of intermittent pain on the right side of the neck likely arthritis.  She continues to tolerate her treatment with Tagrisso fairly well.  She is here today for evaluation and repeat blood work.   MEDICAL HISTORY: Past Medical History:  Diagnosis Date   Allergy    Back pain    Cancer ([facility])     Hypertension    Hypertension    Lung cancer ([facility]) 01/2018   Lung mass    Wears dentures    Wears glasses     ALLERGIES:  is allergic to metoprolol and penicillin g.  MEDICATIONS:  Current Outpatient Medications  Medication Sig Dispense Refill   doxazosin (CARDURA) 2 MG tablet Take 2 mg by mouth daily.     ibuprofen (ADVIL) 600 MG tablet Take 1 tablet (600 mg total) by mouth every 6 (six) hours as needed. (Patient not taking: Reported on 12/01/2020) 30 tablet 0   latanoprost (XALATAN) 0.005 % ophthalmic solution      osimertinib mesylate (TAGRISSO) 80 MG tablet Take 1 tablet (80 mg total) by mouth daily. Faxed to [facility] and [phone].    30 tablet 1   No current facility-administered medications for this visit.    SURGICAL HISTORY:  Past Surgical History:  Procedure Laterality Date   COLONOSCOPY W/ BIOPSIES AND POLYPECTOMY     MULTIPLE TOOTH EXTRACTIONS     VIDEO ASSISTED THORACOSCOPY (VATS)/WEDGE RESECTION Right 02/14/2018   Procedure: RIGHT  VIDEO ASSISTED THORACOSCOPY (VATS) WITH RIGHT LOWER LOBE (RLL) WEDGE RESECTION, COMPLETION RLL LOBECTOMY, LYMPH NODE DISSECTION, ON-Q PLACEMENT;  Surgeon: Grace Isaac, MD;  Location: [facility];  Service: Thoracic;  Laterality: Right;   VIDEO BRONCHOSCOPY N/A 02/14/2018   Procedure: VIDEO BRONCHOSCOPY;  Surgeon: Grace Isaac, MD;  Location: [facility];  Service: Thoracic;  Laterality: N/A;    REVIEW OF SYSTEMS:  A comprehensive review of systems was negative except for: Constitutional: positive for  fatigue Musculoskeletal: positive for neck pain   PHYSICAL EXAMINATION: General appearance: alert, cooperative, and no distress Head: Normocephalic, without obvious abnormality, atraumatic Neck: no adenopathy, no JVD, supple, symmetrical, trachea midline, and thyroid not enlarged, symmetric, no tenderness/mass/nodules Lymph nodes: Cervical, supraclavicular, and axillary nodes normal. Resp: clear to auscultation bilaterally Back: symmetric,  no curvature. ROM normal. No CVA tenderness. Cardio: regular rate and rhythm, S1, S2 normal, no murmur, click, rub or gallop GI: soft, non-tender; bowel sounds normal; no masses,  no organomegaly Extremities: extremities normal, atraumatic, no cyanosis or edema  ECOG PERFORMANCE STATUS: 1 - Symptomatic but completely ambulatory  Blood pressure (!) 162/95, pulse 74, temperature 97.7 F (36.5 C), temperature source Tympanic, resp. rate 18, height _0  (1.626 m), weight 148 lb 8 oz (67.4 kg), SpO2 100 %.  LABORATORY DATA: Lab Results  Component Value Date   WBC 4.0 01/04/2021   HGB 11.7 (L) 01/04/2021   HCT 36.2 01/04/2021   MCV 91.4 01/04/2021   PLT 167 01/04/2021      Chemistry      Component Value Date/Time   NA 143 12/01/2020 0919   K 4.1 12/01/2020 0919   CL 107 12/01/2020 0919   CO2 28 12/01/2020 0919   BUN 20 12/01/2020 0919   CREATININE 1.11 (H) 12/01/2020 0919   CREATININE 0.83 08/06/2015 1616      Component Value Date/Time   CALCIUM 9.5 12/01/2020 0919   ALKPHOS 60 12/01/2020 0919   AST 27 12/01/2020 0919   ALT 27 12/01/2020 0919   BILITOT 0.9 12/01/2020 0919       RADIOGRAPHIC STUDIES: No results found.  ASSESSMENT AND PLAN: This is a very pleasant 70 years old African-American female with highly suspicious recurrent non-small cell lung cancer, initially diagnosed as a stage Ib (T2 a, N0, M0) non-small cell lung cancer, adenocarcinoma status post right lower lobectomy with lymph node dissection in September 2019 under the care of Dr. Servando Snare.  The patient had evidence for disease recurrence in December 2021 presenting with 2 pulmonary nodules in the left upper lobe as well as 1 nodule in the right middle lobe. 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  Her PD-L1 expression is 1%. The patient was found on previous blood work to have evidence for disease progression with enlarging pulmonary  nodules. She started treatment with Tagrisso 80 mg p.o. daily on September 12, 2020 and has been tolerating this treatment well with no concerning adverse effects. The patient continues to tolerate this treatment well with no significant adverse effect except for occasional diarrhea that does not require any treatment at this point. Her lab work today is stable. I recommended for her to continue her current treatment with Tagrisso with the same dose. I will see her back for follow-up visit in 1 months for evaluation and repeat blood work. She was advised to call immediately if she has any concerning symptoms in the interval. The patient voices understanding of current disease status and treatment options and is in agreement with the current care plan.  All questions were answered. The patient knows to call the clinic with any problems, questions or concerns. We can certainly see the patient much sooner if necessary.  Disclaimer: This note was dictated with voice recognition software. Similar sounding words can inadvertently be transcribed and may not be corrected upon review.

## 2021-01-12 ENCOUNTER — Encounter: Payer: Medicare HMO | Admitting: Dietician

## 2021-01-12 ENCOUNTER — Telehealth: Payer: Self-pay | Admitting: Dietician

## 2021-01-12 NOTE — Telephone Encounter (Signed)
Nutrition  Received return call from patient. Patient reports she is currently on break at work and appreciative of call this morning. She reports good appetite and eating well. Patient denies nutrition impact symptoms. Last weight 148 lb 8 oz on 8/15 stable. Patient weighed 148 lb 3.2 oz on 7/12 and 148 lb 4.8 oz on 6/16.   Nutrition Diagnosis: Unintended weight loss stable  Intervention:  Continue eating high calorie, high protein foods for weight maintenance Patient has contact information  Monitoring, Evaluation, Goal: Weight trends, intake  Next Visit: Tuesday, September 13 in clinic

## 2021-01-12 NOTE — Telephone Encounter (Signed)
Nutrition  Attempted to contact patient for nutrition follow-up this morning via telephone. Patient did not answer. Left message with request for return call. Contact information provided.

## 2021-01-14 ENCOUNTER — Other Ambulatory Visit: Payer: Self-pay | Admitting: Physician Assistant

## 2021-01-14 DIAGNOSIS — Z1231 Encounter for screening mammogram for malignant neoplasm of breast: Secondary | ICD-10-CM

## 2021-01-18 ENCOUNTER — Other Ambulatory Visit: Payer: Self-pay | Admitting: Internal Medicine

## 2021-01-18 ENCOUNTER — Telehealth: Payer: Self-pay

## 2021-01-18 DIAGNOSIS — C3491 Malignant neoplasm of unspecified part of right bronchus or lung: Secondary | ICD-10-CM

## 2021-01-18 MED ORDER — OSIMERTINIB MESYLATE 80 MG PO TABS: 80.0000 mg | ORAL_TABLET | Freq: Every day | ORAL | 3 refills | Status: DC

## 2021-01-18 NOTE — Telephone Encounter (Signed)
Pt called requesting a refill of Tagrisso. Pt states she took her last pill on Saturday 01/16/21.

## 2021-01-18 NOTE — Telephone Encounter (Signed)
I have faxed this refill to [facility] and advised the pt of this. Pt was also encouraged to call for a refill at least a week in advance. Pt expressed understanding of this information.

## 2021-01-21 ENCOUNTER — Other Ambulatory Visit: Payer: Self-pay | Admitting: Medical Oncology

## 2021-01-21 DIAGNOSIS — C3491 Malignant neoplasm of unspecified part of right bronchus or lung: Secondary | ICD-10-CM

## 2021-01-21 MED ORDER — OSIMERTINIB MESYLATE 80 MG PO TABS: 80.0000 mg | ORAL_TABLET | Freq: Every day | ORAL | 3 refills | Status: DC

## 2021-01-26 ENCOUNTER — Telehealth: Payer: Self-pay

## 2021-01-26 NOTE — Telephone Encounter (Signed)
Pt LM stating she has not received her rx of Tagrisso.  Rx was faxed to [facility] 01/18/21 with confirmation. This pts rx is paid for though [employer] who have experiencing a known electronic system migration delay which is affecting multiple pts. I have called the pt back and LM advising of this and for her to contact [employer] for an estimated time they will auth her rx.

## 2021-02-02 ENCOUNTER — Other Ambulatory Visit: Payer: Self-pay

## 2021-02-02 ENCOUNTER — Inpatient Hospital Stay: Payer: Medicare HMO | Attending: Internal Medicine | Admitting: Internal Medicine

## 2021-02-02 ENCOUNTER — Inpatient Hospital Stay: Payer: Medicare HMO | Admitting: Dietician

## 2021-02-02 ENCOUNTER — Inpatient Hospital Stay: Payer: Medicare HMO

## 2021-02-02 VITALS — BP 158/92 | HR 76 | Temp 98.5°F | Resp 18 | Ht 64.0 in | Wt 149.8 lb

## 2021-02-02 DIAGNOSIS — C3491 Malignant neoplasm of unspecified part of right bronchus or lung: Secondary | ICD-10-CM

## 2021-02-02 DIAGNOSIS — I10 Essential (primary) hypertension: Secondary | ICD-10-CM | POA: Diagnosis not present

## 2021-02-02 DIAGNOSIS — Z5111 Encounter for antineoplastic chemotherapy: Secondary | ICD-10-CM

## 2021-02-02 DIAGNOSIS — Z79899 Other long term (current) drug therapy: Secondary | ICD-10-CM | POA: Insufficient documentation

## 2021-02-02 DIAGNOSIS — C3431 Malignant neoplasm of lower lobe, right bronchus or lung: Secondary | ICD-10-CM | POA: Insufficient documentation

## 2021-02-02 DIAGNOSIS — C3490 Malignant neoplasm of unspecified part of unspecified bronchus or lung: Secondary | ICD-10-CM

## 2021-02-02 LAB — CMP (CANCER CENTER ONLY)
ALT: 22 U/L (ref 0–44)
AST: 25 U/L (ref 15–41)
Albumin: 3.9 g/dL (ref 3.5–5.0)
Alkaline Phosphatase: 61 U/L (ref 38–126)
Anion gap: 7 (ref 5–15)
BUN: 15 mg/dL (ref 8–23)
CO2: 27 mmol/L (ref 22–32)
Calcium: 9.2 mg/dL (ref 8.9–10.3)
Chloride: 109 mmol/L (ref 98–111)
Creatinine: 1.1 mg/dL — ABNORMAL HIGH (ref 0.44–1.00)
GFR, Estimated: 55 mL/min — ABNORMAL LOW (ref >60)
Glucose, Bld: 80 mg/dL (ref 70–99)
Potassium: 3.7 mmol/L (ref 3.5–5.1)
Sodium: 143 mmol/L (ref 135–145)
Total Bilirubin: 0.9 mg/dL (ref 0.3–1.2)
Total Protein: 6.6 g/dL (ref 6.5–8.1)

## 2021-02-02 LAB — CBC WITH DIFFERENTIAL (CANCER CENTER ONLY)
Abs Immature Granulocytes: 0 10*3/uL (ref 0.00–0.07)
Basophils Absolute: 0 10*3/uL (ref 0.0–0.1)
Basophils Relative: 1 %
Eosinophils Absolute: 0.2 10*3/uL (ref 0.0–0.5)
Eosinophils Relative: 5 %
HCT: 36.4 % (ref 36.0–46.0)
Hemoglobin: 12.1 g/dL (ref 12.0–15.0)
Immature Granulocytes: 0 %
Lymphocytes Relative: 33 %
Lymphs Abs: 1.3 10*3/uL (ref 0.7–4.0)
MCH: 29.6 pg (ref 26.0–34.0)
MCHC: 33.2 g/dL (ref 30.0–36.0)
MCV: 89 fL (ref 80.0–100.0)
Monocytes Absolute: 0.4 10*3/uL (ref 0.1–1.0)
Monocytes Relative: 9 %
Neutro Abs: 2 10*3/uL (ref 1.7–7.7)
Neutrophils Relative %: 52 %
Platelet Count: 163 10*3/uL (ref 150–400)
RBC: 4.09 MIL/uL (ref 3.87–5.11)
RDW: 13.8 % (ref 11.5–15.5)
WBC Count: 3.9 10*3/uL — ABNORMAL LOW (ref 4.0–10.5)
nRBC: 0 % (ref 0.0–0.2)

## 2021-02-02 NOTE — Progress Notes (Signed)
[place] [phone]   Fax:([phone]  OFFICE PROGRESS NOTE  [name], PA-C [address] [place] [address]  DIAGNOSIS: Recurrent non-small cell lung cancer initially diagnosed as stage Ib (T2 a, N0, M0) non-small cell lung cancer, adenocarcinoma in September 2019.  The patient had bilateral pulmonary nodules including 2 nodules in the left upper lobe and 1 nodule in the right middle lobe in December 2021. Biomarker Findings Microsatellite status - MS-Stable Tumor Mutational Burden - 4 Muts/Mb Genomic Findings For a complete list of the genes assayed, please refer to the Appendix. 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 7 Disease relevant genes with no reportable alterations: ALK, BRAF, ERBB2, KRAS, MET, RET, ROS1  PDL1 Expression: 1%.  PRIOR THERAPY: Status post right lower lobectomy with lymph node dissection on February 10, 2018 under the care of Dr. Servando Snare.  CURRENT THERAPY: Tagrisso 80 mg p.o. daily.  First dose started on September 12, 2020.  Status post 5 months of treatment.  INTERVAL HISTORY: Shelby Green 70 y.o. female returns to the clinic today for follow-up visit.  The patient is feeling fine today with no concerning complaints except for mild rash in the lower extremities.  She also has intermittent neck pain with spasm as well as low back pain.  She has no chest pain, shortness of breath, cough or hemoptysis.  She denied having any fever or chills.  She has no nausea, vomiting, diarrhea or constipation.  She continues to tolerate her treatment with Tagrisso fairly well.  The patient is here today for evaluation and repeat blood work.  MEDICAL HISTORY: Past Medical History:  Diagnosis Date   Allergy    Back pain    Cancer ([place])    Hypertension    Hypertension    Lung cancer ([place]) 01/2018   Lung mass    Wears dentures    Wears glasses     ALLERGIES:  is allergic  to metoprolol and penicillin g.  MEDICATIONS:  Current Outpatient Medications  Medication Sig Dispense Refill   doxazosin (CARDURA) 2 MG tablet Take 2 mg by mouth daily.     ibuprofen (ADVIL) 600 MG tablet Take 1 tablet (600 mg total) by mouth every 6 (six) hours as needed. (Patient not taking: Reported on 12/01/2020) 30 tablet 0   latanoprost (XALATAN) 0.005 % ophthalmic solution      osimertinib mesylate (TAGRISSO) 80 MG tablet Take 1 tablet (80 mg total) by mouth daily. Faxed to [facility] and [phone].    30 tablet 3   No current facility-administered medications for this visit.    SURGICAL HISTORY:  Past Surgical History:  Procedure Laterality Date   COLONOSCOPY W/ BIOPSIES AND POLYPECTOMY     MULTIPLE TOOTH EXTRACTIONS     VIDEO ASSISTED THORACOSCOPY (VATS)/WEDGE RESECTION Right 02/14/2018   Procedure: RIGHT  VIDEO ASSISTED THORACOSCOPY (VATS) WITH RIGHT LOWER LOBE (RLL) WEDGE RESECTION, COMPLETION RLL LOBECTOMY, LYMPH NODE DISSECTION, ON-Q PLACEMENT;  Surgeon: Grace Isaac, MD;  Location: [facility];  Service: Thoracic;  Laterality: Right;   VIDEO BRONCHOSCOPY N/A 02/14/2018   Procedure: VIDEO BRONCHOSCOPY;  Surgeon: Grace Isaac, MD;  Location: [facility];  Service: Thoracic;  Laterality: N/A;    REVIEW OF SYSTEMS:  A comprehensive review of systems was negative except for: Constitutional: positive for fatigue Integument/breast: positive for rash Musculoskeletal: positive for back pain and neck pain   PHYSICAL EXAMINATION: General appearance: alert, cooperative, and  no distress Head: Normocephalic, without obvious abnormality, atraumatic Neck: no adenopathy, no JVD, supple, symmetrical, trachea midline, and thyroid not enlarged, symmetric, no tenderness/mass/nodules Lymph nodes: Cervical, supraclavicular, and axillary nodes normal. Resp: clear to auscultation bilaterally Back: symmetric, no curvature. ROM normal. No CVA tenderness. Cardio: regular rate and rhythm, S1, S2  normal, no murmur, click, rub or gallop GI: soft, non-tender; bowel sounds normal; no masses,  no organomegaly Extremities: extremities normal, atraumatic, no cyanosis or edema  ECOG PERFORMANCE STATUS: 1 - Symptomatic but completely ambulatory  Blood pressure (!) 158/92, pulse 76, temperature 98.5 F (36.9 C), temperature source Oral, resp. rate 18, height _0  (1.626 m), weight 149 lb 12.8 oz (67.9 kg), SpO2 100 %.  LABORATORY DATA: Lab Results  Component Value Date   WBC 3.9 (L) 02/02/2021   HGB 12.1 02/02/2021   HCT 36.4 02/02/2021   MCV 89.0 02/02/2021   PLT 163 02/02/2021      Chemistry      Component Value Date/Time   NA 143 02/02/2021 1056   K 3.7 02/02/2021 1056   CL 109 02/02/2021 1056   CO2 27 02/02/2021 1056   BUN 15 02/02/2021 1056   CREATININE 1.10 (H) 02/02/2021 1056   CREATININE 0.83 08/06/2015 1616      Component Value Date/Time   CALCIUM 9.2 02/02/2021 1056   ALKPHOS 61 02/02/2021 1056   AST 25 02/02/2021 1056   ALT 22 02/02/2021 1056   BILITOT 0.9 02/02/2021 1056       RADIOGRAPHIC STUDIES: No results found.  ASSESSMENT AND PLAN: This is a very pleasant 70 years old African-American female with highly suspicious recurrent non-small cell lung cancer, initially diagnosed as a stage Ib (T2 a, N0, M0) non-small cell lung cancer, adenocarcinoma status post right lower lobectomy with lymph node dissection in September 2019 under the care of Dr. Servando Snare.  The patient had evidence for disease recurrence in December 2021 presenting with 2 pulmonary nodules in the left upper lobe as well as 1 nodule in the right middle lobe. 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  Her PD-L1 expression is 1%. The patient was found on previous blood work to have evidence for disease progression with enlarging pulmonary nodules. She started treatment with Tagrisso 80 mg p.o. daily on September 12, 2020. The patient has  been tolerating this treatment well with no concerning complaints except for mild skin rash. I recommended for her to continue her current treatment with Tagrisso with the same dose. I will see her back for follow-up visit in 1 months with repeat CT scan of the chest, abdomen pelvis for restaging of her disease. The patient was advised to call immediately if she has any other concerning symptoms in the interval. The patient voices understanding of current disease status and treatment options and is in agreement with the current care plan.  All questions were answered. The patient knows to call the clinic with any problems, questions or concerns. We can certainly see the patient much sooner if necessary.  Disclaimer: This note was dictated with voice recognition software. Similar sounding words can inadvertently be transcribed and may not be corrected upon review.

## 2021-02-02 NOTE — Progress Notes (Signed)
Nutrition Follow-up:  Patient receiving oral chemotherapy for recurrent non-small cell lung cancer. She is s/p right lower lobectomy 01/2018.  Met with patient in clinic. She reports doing well today, says her appetite is up and down but maintaining her weights. Patient reports not eating much yesterday, had some grapes and Mt. Dew. Sunday patient ate chicken/cheese roll ups, fruit while at work, chicken, cabbage, rice, corn bread for dinner. She is drinking 2 (20oz) bottles of water, sometimes more everyday. She denies nausea, vomiting, constipation, diarrhea. Patient reports "icky" taste when swallowing. She is brushing her teeth once daily.    Medications: Tagrisso 80 mg  Labs: Cr 1.10  Anthropometrics: Weight 149 lb 12.8 oz today stable  8/15 - 148 lb 8 oz 7/12 - 148 lb 3.2 oz 6/16 - 148 lb 4.8 oz  NUTRITION DIAGNOSIS: Unintended weight loss stable   INTERVENTION:  Discussed strategies for altered taste - handout provided Educated on importance of oral care, suggested baking soda salt water rinses several times daily - recipe provided Encouraged to drink Ensure Plus/equivalent as needed with decreased appetite Patient has contact information    MONITORING, EVALUATION, GOAL: weight trends, intake   NEXT VISIT: No follow-up scheduled at this time, patient encouraged to contact as needed with questions or concerns

## 2021-02-04 ENCOUNTER — Telehealth: Payer: Self-pay

## 2021-02-04 ENCOUNTER — Telehealth: Payer: Self-pay | Admitting: Internal Medicine

## 2021-02-04 NOTE — Telephone Encounter (Signed)
Notified Patient of Prior Authorization approval for Tagrisso 80 mg tablets. Medication is approved from 02/03/2021 through 08/02/2021. Assisted with Access 360 enrollment form. No further needs at this time.

## 2021-02-04 NOTE — Telephone Encounter (Signed)
Scheduled appointment per 09/12 los. Left message.

## 2021-02-25 ENCOUNTER — Other Ambulatory Visit: Payer: Self-pay

## 2021-02-25 ENCOUNTER — Ambulatory Visit
Admission: RE | Admit: 2021-02-25 | Discharge: 2021-02-25 | Disposition: A | Discharge status: Home / Self Care | Payer: Medicare HMO | Source: Ambulatory Visit | Attending: Physician Assistant | Admitting: Physician Assistant

## 2021-02-25 DIAGNOSIS — Z1231 Encounter for screening mammogram for malignant neoplasm of breast: Secondary | ICD-10-CM

## 2021-02-26 ENCOUNTER — Telehealth: Payer: Self-pay

## 2021-02-26 ENCOUNTER — Inpatient Hospital Stay: Payer: Medicare HMO | Attending: Internal Medicine

## 2021-02-26 DIAGNOSIS — C3490 Malignant neoplasm of unspecified part of unspecified bronchus or lung: Secondary | ICD-10-CM

## 2021-02-26 DIAGNOSIS — C3431 Malignant neoplasm of lower lobe, right bronchus or lung: Secondary | ICD-10-CM | POA: Insufficient documentation

## 2021-02-26 LAB — CBC WITH DIFFERENTIAL (CANCER CENTER ONLY)
Abs Immature Granulocytes: 0 10*3/uL (ref 0.00–0.07)
Basophils Absolute: 0 10*3/uL (ref 0.0–0.1)
Basophils Relative: 1 %
Eosinophils Absolute: 0.2 10*3/uL (ref 0.0–0.5)
Eosinophils Relative: 7 %
HCT: 36.8 % (ref 36.0–46.0)
Hemoglobin: 11.9 g/dL — ABNORMAL LOW (ref 12.0–15.0)
Immature Granulocytes: 0 %
Lymphocytes Relative: 43 %
Lymphs Abs: 1.5 10*3/uL (ref 0.7–4.0)
MCH: 29 pg (ref 26.0–34.0)
MCHC: 32.3 g/dL (ref 30.0–36.0)
MCV: 89.5 fL (ref 80.0–100.0)
Monocytes Absolute: 0.3 10*3/uL (ref 0.1–1.0)
Monocytes Relative: 8 %
Neutro Abs: 1.3 10*3/uL — ABNORMAL LOW (ref 1.7–7.7)
Neutrophils Relative %: 41 %
Platelet Count: 151 10*3/uL (ref 150–400)
RBC: 4.11 MIL/uL (ref 3.87–5.11)
RDW: 14.1 % (ref 11.5–15.5)
WBC Count: 3.3 10*3/uL — ABNORMAL LOW (ref 4.0–10.5)
nRBC: 0 % (ref 0.0–0.2)

## 2021-02-26 LAB — CMP (CANCER CENTER ONLY)
ALT: 36 U/L (ref 0–44)
AST: 32 U/L (ref 15–41)
Albumin: 3.8 g/dL (ref 3.5–5.0)
Alkaline Phosphatase: 61 U/L (ref 38–126)
Anion gap: 5 (ref 5–15)
BUN: 16 mg/dL (ref 8–23)
CO2: 26 mmol/L (ref 22–32)
Calcium: 8.9 mg/dL (ref 8.9–10.3)
Chloride: 111 mmol/L (ref 98–111)
Creatinine: 1.02 mg/dL — ABNORMAL HIGH (ref 0.44–1.00)
GFR, Estimated: 60 mL/min — ABNORMAL LOW (ref >60)
Glucose, Bld: 93 mg/dL (ref 70–99)
Potassium: 4 mmol/L (ref 3.5–5.1)
Sodium: 142 mmol/L (ref 135–145)
Total Bilirubin: 0.7 mg/dL (ref 0.3–1.2)
Total Protein: 6.5 g/dL (ref 6.5–8.1)

## 2021-02-26 NOTE — Telephone Encounter (Signed)
Spoke with patient at the [facility] regarding her concerns related to the authorization of her CT scan on Monday. Sent message to on-call authorization specialist, Lendell Caprice.  Patient informed that at this time, her request has gone to review. Patient will be updated as to the status of her scan.  Message left with on-coming nurse to follow-up with patient prior to scan on Monday.

## 2021-03-01 ENCOUNTER — Ambulatory Visit (HOSPITAL_COMMUNITY)
Admission: RE | Admit: 2021-03-01 | Discharge: 2021-03-01 | Disposition: A | Discharge status: Home / Self Care | Payer: Medicare HMO | Source: Ambulatory Visit | Attending: Internal Medicine | Admitting: Internal Medicine

## 2021-03-01 ENCOUNTER — Encounter (HOSPITAL_COMMUNITY): Payer: Self-pay

## 2021-03-01 ENCOUNTER — Other Ambulatory Visit: Payer: Self-pay

## 2021-03-01 DIAGNOSIS — C3490 Malignant neoplasm of unspecified part of unspecified bronchus or lung: Secondary | ICD-10-CM

## 2021-03-01 MED ORDER — IOHEXOL 350 MG/ML SOLN
80.0000 mL | Freq: Once | INTRAVENOUS | Status: AC | PRN
  Administered 2021-03-01: 80 mL via INTRAVENOUS

## 2021-03-02 ENCOUNTER — Encounter: Payer: Self-pay | Admitting: *Deleted

## 2021-03-02 ENCOUNTER — Inpatient Hospital Stay: Payer: Medicare HMO | Admitting: Internal Medicine

## 2021-03-02 VITALS — BP 157/99 | HR 88 | Temp 97.8°F | Resp 19 | Ht 64.0 in | Wt 148.5 lb

## 2021-03-02 DIAGNOSIS — C3491 Malignant neoplasm of unspecified part of right bronchus or lung: Secondary | ICD-10-CM

## 2021-03-02 DIAGNOSIS — C3431 Malignant neoplasm of lower lobe, right bronchus or lung: Secondary | ICD-10-CM | POA: Diagnosis not present

## 2021-03-02 NOTE — Progress Notes (Signed)
[facility] [phone]   Fax:(336) [phone]  OFFICE PROGRESS NOTE  [name], PA-C [address] [place] [address]  DIAGNOSIS: Recurrent non-small cell lung cancer initially diagnosed as stage Ib (T2 a, N0, M0) non-small cell lung cancer, adenocarcinoma in September 2019.  The patient had bilateral pulmonary nodules including 2 nodules in the left upper lobe and 1 nodule in the right middle lobe in December 2021. Biomarker Findings Microsatellite status - MS-Stable Tumor Mutational Burden - 4 Muts/Mb Genomic Findings For a complete list of the genes assayed, please refer to the Appendix. 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 7 Disease relevant genes with no reportable alterations: ALK, BRAF, ERBB2, KRAS, MET, RET, ROS1  PDL1 Expression: 1%.  PRIOR THERAPY: Status post right lower lobectomy with lymph node dissection on February 10, 2018 under the care of Dr. Servando Snare.  CURRENT THERAPY: Tagrisso 80 mg p.o. daily.  First dose started on September 12, 2020.  Status post 6 months of treatment.  INTERVAL HISTORY: Shelby Green 70 y.o. female returns to the clinic today for follow-up visit.  The patient is feeling fine today with no concerning complaints except for diarrhea after drinking the oral contrast for the CT scan.  She denied having any current chest pain, shortness of breath, cough or hemoptysis.  She denied having any fever or chills.  She has no nausea, vomiting, abdominal pain or constipation.  She denied having any significant weight loss or night sweats.  She has no headache or visual changes.  She has been tolerating her treatment with Tagrisso fairly well.  She is here today for evaluation with repeat CT scan of the chest, abdomen pelvis for restaging of her disease.   MEDICAL HISTORY: Past Medical History:  Diagnosis Date   Allergy    Back pain    Cancer ([place])    Hypertension     Hypertension    Lung cancer ([place]) 01/2018   Lung mass    Wears dentures    Wears glasses     ALLERGIES:  is allergic to metoprolol and penicillin g.  MEDICATIONS:  Current Outpatient Medications  Medication Sig Dispense Refill   doxazosin (CARDURA) 2 MG tablet Take 2 mg by mouth daily.     ibuprofen (ADVIL) 600 MG tablet Take 1 tablet (600 mg total) by mouth every 6 (six) hours as needed. 30 tablet 0   latanoprost (XALATAN) 0.005 % ophthalmic solution      osimertinib mesylate (TAGRISSO) 80 MG tablet Take 1 tablet (80 mg total) by mouth daily. Faxed to [facility] and [phone].    30 tablet 3   No current facility-administered medications for this visit.    SURGICAL HISTORY:  Past Surgical History:  Procedure Laterality Date   COLONOSCOPY W/ BIOPSIES AND POLYPECTOMY     MULTIPLE TOOTH EXTRACTIONS     VIDEO ASSISTED THORACOSCOPY (VATS)/WEDGE RESECTION Right 02/14/2018   Procedure: RIGHT  VIDEO ASSISTED THORACOSCOPY (VATS) WITH RIGHT LOWER LOBE (RLL) WEDGE RESECTION, COMPLETION RLL LOBECTOMY, LYMPH NODE DISSECTION, ON-Q PLACEMENT;  Surgeon: Grace Isaac, MD;  Location: [facility];  Service: Thoracic;  Laterality: Right;   VIDEO BRONCHOSCOPY N/A 02/14/2018   Procedure: VIDEO BRONCHOSCOPY;  Surgeon: Grace Isaac, MD;  Location: [facility];  Service: Thoracic;  Laterality: N/A;    REVIEW OF SYSTEMS:  Constitutional: negative Eyes: negative Ears, nose, mouth, throat, and face: negative Respiratory: negative Cardiovascular: negative Gastrointestinal: positive for diarrhea  Genitourinary:negative Integument/breast: negative Hematologic/lymphatic: negative Musculoskeletal:negative Neurological: negative Behavioral/Psych: negative Endocrine: negative   PHYSICAL EXAMINATION: General appearance: alert, cooperative, and no distress Head: Normocephalic, without obvious abnormality, atraumatic Neck: no adenopathy, no JVD, supple, symmetrical, trachea midline, and thyroid not enlarged,  symmetric, no tenderness/mass/nodules Lymph nodes: Cervical, supraclavicular, and axillary nodes normal. Resp: clear to auscultation bilaterally Back: symmetric, no curvature. ROM normal. No CVA tenderness. Cardio: regular rate and rhythm, S1, S2 normal, no murmur, click, rub or gallop GI: soft, non-tender; bowel sounds normal; no masses,  no organomegaly Extremities: extremities normal, atraumatic, no cyanosis or edema Neurologic: Alert and oriented X 3, normal strength and tone. Normal symmetric reflexes. Normal coordination and gait  ECOG PERFORMANCE STATUS: 1 - Symptomatic but completely ambulatory  Blood pressure (!) 157/99, pulse 88, temperature 97.8 F (36.6 C), temperature source Tympanic, resp. rate 19, height _0  (1.626 m), weight 148 lb 8 oz (67.4 kg), SpO2 100 %.  LABORATORY DATA: Lab Results  Component Value Date   WBC 3.3 (L) 02/26/2021   HGB 11.9 (L) 02/26/2021   HCT 36.8 02/26/2021   MCV 89.5 02/26/2021   PLT 151 02/26/2021      Chemistry      Component Value Date/Time   NA 142 02/26/2021 0850   K 4.0 02/26/2021 0850   CL 111 02/26/2021 0850   CO2 26 02/26/2021 0850   BUN 16 02/26/2021 0850   CREATININE 1.02 (H) 02/26/2021 0850   CREATININE 0.83 08/06/2015 1616      Component Value Date/Time   CALCIUM 8.9 02/26/2021 0850   ALKPHOS 61 02/26/2021 0850   AST 32 02/26/2021 0850   ALT 36 02/26/2021 0850   BILITOT 0.7 02/26/2021 0850       RADIOGRAPHIC STUDIES: CT Chest W Contrast  Result Date: 03/01/2021 CLINICAL DATA:  Non-small cell lung cancer restaging, ongoing chemotherapy EXAM: CT CHEST, ABDOMEN, AND PELVIS WITH CONTRAST TECHNIQUE: Multidetector CT imaging of the chest, abdomen and pelvis was performed following the standard protocol during bolus administration of intravenous contrast. CONTRAST:  68m OMNIPAQUE IOHEXOL 350 MG/ML SOLN, additional oral enteric contrast COMPARISON:  11/27/2020 FINDINGS: CT CHEST FINDINGS Cardiovascular: Aortic  atherosclerosis. Normal heart size. No pericardial effusion. Mediastinum/Nodes: No enlarged mediastinal, hilar, or axillary lymph nodes. Thyroid gland, trachea, and esophagus demonstrate no significant findings. Lungs/Pleura: Status post right lower lobectomy. Unchanged focus of pleural thickening and or rounded atelectasis about the peripheral right middle lobe measuring 1.9 x 0.8 cm (series 5, image 90). Unchanged, minimal, bandlike residua of previously noted left upper lobe pulmonary nodules (series 5, image 41, 38). No pleural effusion or pneumothorax. Musculoskeletal: No chest wall mass or suspicious bone lesions identified. CT ABDOMEN PELVIS FINDINGS Hepatobiliary: No solid liver abnormality is seen. No gallstones, gallbladder wall thickening, or biliary dilatation. Pancreas: Unremarkable. No pancreatic ductal dilatation or surrounding inflammatory changes. Spleen: Normal in size without significant abnormality. Adrenals/Urinary Tract: Adrenal glands are unremarkable. Kidneys are normal, without renal calculi, solid lesion, or hydronephrosis. Bladder is unremarkable. Stomach/Bowel: Stomach is within normal limits. Appendix appears normal. No evidence of bowel wall thickening, distention, or inflammatory changes. Descending and sigmoid diverticulosis. Vascular/Lymphatic: No significant vascular findings are present. No enlarged abdominal or pelvic lymph nodes. Reproductive: Uterine fibroids. Other: Fat containing umbilical hernia.  No abdominopelvic ascites. Musculoskeletal: No acute or significant osseous findings. 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 IMPRESSION: 1. Unchanged post treatment appearance of the chest status post right lower lobectomy. 2. Unchanged focus of pleural thickening and or rounded  atelectasis about the peripheral right middle lobe. Attention on follow-up. 3. Unchanged, minimal, bandlike residua of previously noted left upper lobe pulmonary nodules. No new nodules.  4. No evidence of metastatic disease in the abdomen or pelvis. Aortic Atherosclerosis (ICD10-I70.0). Electronically Signed   By: Delanna Ahmadi M.D.   On: 03/01/2021 19:32   CT Abdomen Pelvis W Contrast  Result Date: 03/01/2021 CLINICAL DATA:  Non-small cell lung cancer restaging, ongoing chemotherapy EXAM: CT CHEST, ABDOMEN, AND PELVIS WITH CONTRAST TECHNIQUE: Multidetector CT imaging of the chest, abdomen and pelvis was performed following the standard protocol during bolus administration of intravenous contrast. CONTRAST:  68m OMNIPAQUE IOHEXOL 350 MG/ML SOLN, additional oral enteric contrast COMPARISON:  11/27/2020 FINDINGS: CT CHEST FINDINGS Cardiovascular: Aortic atherosclerosis. Normal heart size. No pericardial effusion. Mediastinum/Nodes: No enlarged mediastinal, hilar, or axillary lymph nodes. Thyroid gland, trachea, and esophagus demonstrate no significant findings. Lungs/Pleura: Status post right lower lobectomy. Unchanged focus of pleural thickening and or rounded atelectasis about the peripheral right middle lobe measuring 1.9 x 0.8 cm (series 5, image 90). Unchanged, minimal, bandlike residua of previously noted left upper lobe pulmonary nodules (series 5, image 41, 38). No pleural effusion or pneumothorax. Musculoskeletal: No chest wall mass or suspicious bone lesions identified. CT ABDOMEN PELVIS FINDINGS Hepatobiliary: No solid liver abnormality is seen. No gallstones, gallbladder wall thickening, or biliary dilatation. Pancreas: Unremarkable. No pancreatic ductal dilatation or surrounding inflammatory changes. Spleen: Normal in size without significant abnormality. Adrenals/Urinary Tract: Adrenal glands are unremarkable. Kidneys are normal, without renal calculi, solid lesion, or hydronephrosis. Bladder is unremarkable. Stomach/Bowel: Stomach is within normal limits. Appendix appears normal. No evidence of bowel wall thickening, distention, or inflammatory changes. Descending and sigmoid  diverticulosis. Vascular/Lymphatic: No significant vascular findings are present. No enlarged abdominal or pelvic lymph nodes. Reproductive: Uterine fibroids. Other: Fat containing umbilical hernia.  No abdominopelvic ascites. Musculoskeletal: No acute or significant osseous findings. 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 IMPRESSION: 1. Unchanged post treatment appearance of the chest status post right lower lobectomy. 2. Unchanged focus of pleural thickening and or rounded atelectasis about the peripheral right middle lobe. Attention on follow-up. 3. Unchanged, minimal, bandlike residua of previously noted left upper lobe pulmonary nodules. No new nodules. 4. No evidence of metastatic disease in the abdomen or pelvis. Aortic Atherosclerosis (ICD10-I70.0). Electronically Signed   By: ADelanna AhmadiM.D.   On: 03/01/2021 19:32   MM 3D SCREEN BREAST BILATERAL  Result Date: 03/01/2021 CLINICAL DATA:  Screening. EXAM: DIGITAL SCREENING BILATERAL MAMMOGRAM WITH TOMOSYNTHESIS AND CAD TECHNIQUE: Bilateral screening digital craniocaudal and mediolateral oblique mammograms were obtained. Bilateral screening digital breast tomosynthesis was performed. The images were evaluated with computer-aided detection. COMPARISON:  Previous exam(s). ACR Breast Density Category b: There are scattered areas of fibroglandular density. FINDINGS: There are no findings suspicious for malignancy. IMPRESSION: No mammographic evidence of malignancy. A result letter of this screening mammogram will be mailed directly to the patient. RECOMMENDATION: Screening mammogram in one year. (Code:[id number]) BI-RADS CATEGORY  1: Negative. Electronically Signed   By: SKristopher OppenheimM.D.   On: 03/01/2021 14:27    ASSESSMENT AND PLAN: This is a very pleasant 70years old African-American female with highly suspicious recurrent non-small cell lung cancer, initially diagnosed as a stage Ib (T2 a, N0, M0) non-small cell lung cancer,  adenocarcinoma status post right lower lobectomy with lymph node dissection in September 2019 under the care of Dr. GServando Snare  The patient had evidence for disease recurrence in December 2021  presenting with 2 pulmonary nodules in the left upper lobe as well as 1 nodule in the right middle lobe. 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  Her PD-L1 expression is 1%. The patient was found on previous blood work to have evidence for disease progression with enlarging pulmonary nodules. She started treatment with Tagrisso 80 mg p.o. daily on September 12, 2020.  She is status post 6 months of treatment. The patient has been tolerating her treatment well with no concerning adverse effects. The patient had repeat CT scan of the chest, abdomen pelvis performed recently.  I personally and independently reviewed the scans and discussed the results with the patient today. Her scan showed no concerning findings for disease progression. I recommended for her to continue her current treatment with Tagrisso with the same dose. I will see her back for follow-up visit in 6 weeks for evaluation and repeat blood work. The patient was advised to call immediately if she has any other concerning symptoms in the interval.  The patient voices understanding of current disease status and treatment options and is in agreement with the current care plan.  All questions were answered. The patient knows to call the clinic with any problems, questions or concerns. We can certainly see the patient much sooner if necessary.  Disclaimer: This note was dictated with voice recognition software. Similar sounding words can inadvertently be transcribed and may not be corrected upon review.

## 2021-03-02 NOTE — Progress Notes (Signed)
Spoke to Ms. Shelby Green today at clinic. She is doing well without complaints from her treatment.  She verbalized understanding of her continued plan of care. No barriers identified at this time.

## 2021-03-17 ENCOUNTER — Other Ambulatory Visit: Payer: Self-pay | Admitting: Internal Medicine

## 2021-03-17 ENCOUNTER — Other Ambulatory Visit: Payer: Self-pay | Admitting: Medical Oncology

## 2021-03-17 DIAGNOSIS — C3491 Malignant neoplasm of unspecified part of right bronchus or lung: Secondary | ICD-10-CM

## 2021-03-17 MED ORDER — OSIMERTINIB MESYLATE 80 MG PO TABS: 80.0000 mg | ORAL_TABLET | Freq: Every day | ORAL | 3 refills | Status: DC

## 2021-03-25 MED ORDER — OSIMERTINIB MESYLATE 80 MG PO TABS: 80.0000 mg | ORAL_TABLET | Freq: Every day | ORAL | 3 refills | Status: DC

## 2021-03-25 NOTE — Addendum Note (Signed)
Addended by: Ardeen Garland on: 03/25/2021 03:53 PM   Modules accepted: Orders

## 2021-04-13 ENCOUNTER — Other Ambulatory Visit: Payer: Self-pay

## 2021-04-13 ENCOUNTER — Inpatient Hospital Stay: Payer: Medicare HMO | Admitting: Internal Medicine

## 2021-04-13 ENCOUNTER — Inpatient Hospital Stay: Payer: Medicare HMO | Attending: Internal Medicine

## 2021-04-13 VITALS — BP 163/89 | HR 74 | Temp 97.5°F | Resp 19 | Ht 64.0 in | Wt 149.4 lb

## 2021-04-13 DIAGNOSIS — C3491 Malignant neoplasm of unspecified part of right bronchus or lung: Secondary | ICD-10-CM

## 2021-04-13 DIAGNOSIS — C3490 Malignant neoplasm of unspecified part of unspecified bronchus or lung: Secondary | ICD-10-CM | POA: Diagnosis not present

## 2021-04-13 DIAGNOSIS — R21 Rash and other nonspecific skin eruption: Secondary | ICD-10-CM | POA: Insufficient documentation

## 2021-04-13 DIAGNOSIS — C3431 Malignant neoplasm of lower lobe, right bronchus or lung: Secondary | ICD-10-CM | POA: Diagnosis not present

## 2021-04-13 LAB — CMP (CANCER CENTER ONLY)
ALT: 30 U/L (ref 0–44)
AST: 25 U/L (ref 15–41)
Albumin: 3.8 g/dL (ref 3.5–5.0)
Alkaline Phosphatase: 63 U/L (ref 38–126)
Anion gap: 7 (ref 5–15)
BUN: 17 mg/dL (ref 8–23)
CO2: 28 mmol/L (ref 22–32)
Calcium: 8.9 mg/dL (ref 8.9–10.3)
Chloride: 108 mmol/L (ref 98–111)
Creatinine: 1.1 mg/dL — ABNORMAL HIGH (ref 0.44–1.00)
GFR, Estimated: 55 mL/min — ABNORMAL LOW (ref >60)
Glucose, Bld: 87 mg/dL (ref 70–99)
Potassium: 4.1 mmol/L (ref 3.5–5.1)
Sodium: 143 mmol/L (ref 135–145)
Total Bilirubin: 0.6 mg/dL (ref 0.3–1.2)
Total Protein: 6.5 g/dL (ref 6.5–8.1)

## 2021-04-13 LAB — CBC WITH DIFFERENTIAL (CANCER CENTER ONLY)
Abs Immature Granulocytes: 0 10*3/uL (ref 0.00–0.07)
Basophils Absolute: 0 10*3/uL (ref 0.0–0.1)
Basophils Relative: 1 %
Eosinophils Absolute: 0.2 10*3/uL (ref 0.0–0.5)
Eosinophils Relative: 4 %
HCT: 36.9 % (ref 36.0–46.0)
Hemoglobin: 11.8 g/dL — ABNORMAL LOW (ref 12.0–15.0)
Immature Granulocytes: 0 %
Lymphocytes Relative: 38 %
Lymphs Abs: 1.5 10*3/uL (ref 0.7–4.0)
MCH: 29.4 pg (ref 26.0–34.0)
MCHC: 32 g/dL (ref 30.0–36.0)
MCV: 91.8 fL (ref 80.0–100.0)
Monocytes Absolute: 0.3 10*3/uL (ref 0.1–1.0)
Monocytes Relative: 8 %
Neutro Abs: 1.9 10*3/uL (ref 1.7–7.7)
Neutrophils Relative %: 49 %
Platelet Count: 166 10*3/uL (ref 150–400)
RBC: 4.02 MIL/uL (ref 3.87–5.11)
RDW: 14.3 % (ref 11.5–15.5)
WBC Count: 3.9 10*3/uL — ABNORMAL LOW (ref 4.0–10.5)
nRBC: 0 % (ref 0.0–0.2)

## 2021-04-13 NOTE — Progress Notes (Signed)
[facility] [phone]   Fax:(336) [phone]  OFFICE PROGRESS NOTE  [name], PA-C [address] [place] [address]  DIAGNOSIS: Recurrent non-small cell lung cancer initially diagnosed as stage Ib (T2 a, N0, M0) non-small cell lung cancer, adenocarcinoma in September 2019.  The patient had bilateral pulmonary nodules including 2 nodules in the left upper lobe and 1 nodule in the right middle lobe in December 2021. Biomarker Findings Microsatellite status - MS-Stable Tumor Mutational Burden - 4 Muts/Mb Genomic Findings For a complete list of the genes assayed, please refer to the Appendix. 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 7 Disease relevant genes with no reportable alterations: ALK, BRAF, ERBB2, KRAS, MET, RET, ROS1  PDL1 Expression: 1%.  PRIOR THERAPY: Status post right lower lobectomy with lymph node dissection on February 10, 2018 under the care of Dr. Servando Snare.  CURRENT THERAPY: Tagrisso 80 mg p.o. daily.  First dose started on September 12, 2020.  Status post 7.5 months of treatment.  INTERVAL HISTORY: Shelby Green 70 y.o. female returns to the clinic today for follow-up visit.  The patient is feeling fine today with no concerning complaints except for some sores and skin breaks on the fingertips as well as occasional tenderness in the scalp.  She denied having any current chest pain, shortness of breath, cough or hemoptysis.  She denied having any fever or chills.  She has no nausea, vomiting, diarrhea or constipation.  She has no headache or visual changes.  She is here today for evaluation and repeat blood work.   MEDICAL HISTORY: Past Medical History:  Diagnosis Date   Allergy    Back pain    Cancer ([place])    Hypertension    Hypertension    Lung cancer ([place]) 01/2018   Lung mass    Wears dentures    Wears glasses     ALLERGIES:  is allergic to metoprolol and penicillin  g.  MEDICATIONS:  Current Outpatient Medications  Medication Sig Dispense Refill   doxazosin (CARDURA) 2 MG tablet Take 2 mg by mouth daily.     ibuprofen (ADVIL) 600 MG tablet Take 1 tablet (600 mg total) by mouth every 6 (six) hours as needed. 30 tablet 0   latanoprost (XALATAN) 0.005 % ophthalmic solution      osimertinib mesylate (TAGRISSO) 80 MG tablet Take 1 tablet (80 mg total) by mouth daily. Faxed to [facility] and [phone].    30 tablet 3   No current facility-administered medications for this visit.    SURGICAL HISTORY:  Past Surgical History:  Procedure Laterality Date   COLONOSCOPY W/ BIOPSIES AND POLYPECTOMY     MULTIPLE TOOTH EXTRACTIONS     VIDEO ASSISTED THORACOSCOPY (VATS)/WEDGE RESECTION Right 02/14/2018   Procedure: RIGHT  VIDEO ASSISTED THORACOSCOPY (VATS) WITH RIGHT LOWER LOBE (RLL) WEDGE RESECTION, COMPLETION RLL LOBECTOMY, LYMPH NODE DISSECTION, ON-Q PLACEMENT;  Surgeon: Grace Isaac, MD;  Location: [facility];  Service: Thoracic;  Laterality: Right;   VIDEO BRONCHOSCOPY N/A 02/14/2018   Procedure: VIDEO BRONCHOSCOPY;  Surgeon: Grace Isaac, MD;  Location: [facility];  Service: Thoracic;  Laterality: N/A;    REVIEW OF SYSTEMS:  A comprehensive review of systems was negative except for: Integument/breast: positive for dryness and rash   PHYSICAL EXAMINATION: General appearance: alert, cooperative, and no distress Head: Normocephalic, without obvious abnormality, atraumatic Neck: no adenopathy, no JVD, supple, symmetrical, trachea midline, and thyroid not enlarged, symmetric,  no tenderness/mass/nodules Lymph nodes: Cervical, supraclavicular, and axillary nodes normal. Resp: clear to auscultation bilaterally Back: symmetric, no curvature. ROM normal. No CVA tenderness. Cardio: regular rate and rhythm, S1, S2 normal, no murmur, click, rub or gallop GI: soft, non-tender; bowel sounds normal; no masses,  no organomegaly Extremities: extremities normal, atraumatic,  no cyanosis or edema  ECOG PERFORMANCE STATUS: 1 - Symptomatic but completely ambulatory  Blood pressure (!) 163/89, pulse 74, temperature (!) 97.5 F (36.4 C), temperature source Tympanic, resp. rate 19, height '5\' 4"'  (1.626 m), weight 149 lb 6.4 oz (67.8 kg), SpO2 100 %.  LABORATORY DATA: Lab Results  Component Value Date   WBC 3.9 (L) 04/13/2021   HGB 11.8 (L) 04/13/2021   HCT 36.9 04/13/2021   MCV 91.8 04/13/2021   PLT 166 04/13/2021      Chemistry      Component Value Date/Time   NA 142 02/26/2021 0850   K 4.0 02/26/2021 0850   CL 111 02/26/2021 0850   CO2 26 02/26/2021 0850   BUN 16 02/26/2021 0850   CREATININE 1.02 (H) 02/26/2021 0850   CREATININE 0.83 08/06/2015 1616      Component Value Date/Time   CALCIUM 8.9 02/26/2021 0850   ALKPHOS 61 02/26/2021 0850   AST 32 02/26/2021 0850   ALT 36 02/26/2021 0850   BILITOT 0.7 02/26/2021 0850       RADIOGRAPHIC STUDIES: No results found.  ASSESSMENT AND PLAN: This is a very pleasant 70 years old African-American female with highly suspicious recurrent non-small cell lung cancer, initially diagnosed as a stage Ib (T2 a, N0, M0) non-small cell lung cancer, adenocarcinoma status post right lower lobectomy with lymph node dissection in September 2019 under the care of Dr. Servando Snare.  The patient had evidence for disease recurrence in December 2021 presenting with 2 pulmonary nodules in the left upper lobe as well as 1 nodule in the right middle lobe. 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  Her PD-L1 expression is 1%. The patient was found on previous blood work to have evidence for disease progression with enlarging pulmonary nodules. She started treatment with Tagrisso 80 mg p.o. daily on September 12, 2020.  She is status post 7.5 months of treatment. The patient has been tolerating her treatment well except for mild skin breaks at the fingertips and dryness of the  skin. I recommended for her to continue her current treatment with Tagrisso with the same dose. I will see her back for follow-up visit in 6 weeks for evaluation with repeat CT scan of the chest for restaging of her disease. The patient was advised to call immediately if she has any other concerning symptoms in the interval. For the skin rash and dryness she was advised to continue to lysing lotions and to keep it moist most of the time. The patient was advised to call immediately if she has any other concerning symptoms in the interval.  The patient voices understanding of current disease status and treatment options and is in agreement with the current care plan.  All questions were answered. The patient knows to call the clinic with any problems, questions or concerns. We can certainly see the patient much sooner if necessary.  Disclaimer: This note was dictated with voice recognition software. Similar sounding words can inadvertently be transcribed and may not be corrected upon review.

## 2021-04-22 ENCOUNTER — Telehealth: Payer: Self-pay | Admitting: Internal Medicine

## 2021-04-22 NOTE — Telephone Encounter (Signed)
Sch per 11/21 los, pt aware.

## 2021-05-25 ENCOUNTER — Encounter: Payer: Self-pay | Admitting: Internal Medicine

## 2021-05-26 ENCOUNTER — Other Ambulatory Visit: Payer: Self-pay

## 2021-05-26 ENCOUNTER — Ambulatory Visit (HOSPITAL_COMMUNITY)
Admission: RE | Admit: 2021-05-26 | Discharge: 2021-05-26 | Disposition: A | Discharge status: Home / Self Care | Payer: Medicare HMO | Source: Ambulatory Visit | Attending: Internal Medicine | Admitting: Internal Medicine

## 2021-05-26 ENCOUNTER — Inpatient Hospital Stay: Payer: Medicare HMO | Attending: Internal Medicine

## 2021-05-26 DIAGNOSIS — C3431 Malignant neoplasm of lower lobe, right bronchus or lung: Secondary | ICD-10-CM | POA: Insufficient documentation

## 2021-05-26 DIAGNOSIS — C3490 Malignant neoplasm of unspecified part of unspecified bronchus or lung: Secondary | ICD-10-CM | POA: Insufficient documentation

## 2021-05-26 LAB — CBC WITH DIFFERENTIAL (CANCER CENTER ONLY)
Abs Immature Granulocytes: 0.01 10*3/uL (ref 0.00–0.07)
Basophils Absolute: 0 10*3/uL (ref 0.0–0.1)
Basophils Relative: 1 %
Eosinophils Absolute: 0.2 10*3/uL (ref 0.0–0.5)
Eosinophils Relative: 4 %
HCT: 36.4 % (ref 36.0–46.0)
Hemoglobin: 12 g/dL (ref 12.0–15.0)
Immature Granulocytes: 0 %
Lymphocytes Relative: 31 %
Lymphs Abs: 1.2 10*3/uL (ref 0.7–4.0)
MCH: 29.8 pg (ref 26.0–34.0)
MCHC: 33 g/dL (ref 30.0–36.0)
MCV: 90.3 fL (ref 80.0–100.0)
Monocytes Absolute: 0.3 10*3/uL (ref 0.1–1.0)
Monocytes Relative: 7 %
Neutro Abs: 2.2 10*3/uL (ref 1.7–7.7)
Neutrophils Relative %: 57 %
Platelet Count: 147 10*3/uL — ABNORMAL LOW (ref 150–400)
RBC: 4.03 MIL/uL (ref 3.87–5.11)
RDW: 13.9 % (ref 11.5–15.5)
WBC Count: 3.9 10*3/uL — ABNORMAL LOW (ref 4.0–10.5)
nRBC: 0 % (ref 0.0–0.2)

## 2021-05-26 LAB — CMP (CANCER CENTER ONLY)
ALT: 26 U/L (ref 0–44)
AST: 27 U/L (ref 15–41)
Albumin: 3.9 g/dL (ref 3.5–5.0)
Alkaline Phosphatase: 57 U/L (ref 38–126)
Anion gap: 5 (ref 5–15)
BUN: 15 mg/dL (ref 8–23)
CO2: 29 mmol/L (ref 22–32)
Calcium: 9 mg/dL (ref 8.9–10.3)
Chloride: 108 mmol/L (ref 98–111)
Creatinine: 1.09 mg/dL — ABNORMAL HIGH (ref 0.44–1.00)
GFR, Estimated: 55 mL/min — ABNORMAL LOW (ref >60)
Glucose, Bld: 88 mg/dL (ref 70–99)
Potassium: 3.9 mmol/L (ref 3.5–5.1)
Sodium: 142 mmol/L (ref 135–145)
Total Bilirubin: 0.7 mg/dL (ref 0.3–1.2)
Total Protein: 6.6 g/dL (ref 6.5–8.1)

## 2021-05-26 MED ORDER — IOHEXOL 350 MG/ML SOLN
60.0000 mL | Freq: Once | INTRAVENOUS | Status: AC | PRN
  Administered 2021-05-26: 60 mL via INTRAVENOUS

## 2021-05-26 MED ORDER — SODIUM CHLORIDE (PF) 0.9 % IJ SOLN
INTRAMUSCULAR | Status: AC
  Filled 2021-05-26: qty 50

## 2021-05-31 ENCOUNTER — Other Ambulatory Visit: Payer: Self-pay

## 2021-05-31 ENCOUNTER — Inpatient Hospital Stay (HOSPITAL_BASED_OUTPATIENT_CLINIC_OR_DEPARTMENT_OTHER): Payer: Medicare HMO | Admitting: Internal Medicine

## 2021-05-31 ENCOUNTER — Telehealth: Payer: Self-pay

## 2021-05-31 VITALS — BP 149/93 | HR 79 | Temp 97.9°F | Resp 16 | Ht 64.0 in | Wt 145.0 lb

## 2021-05-31 DIAGNOSIS — C3491 Malignant neoplasm of unspecified part of right bronchus or lung: Secondary | ICD-10-CM | POA: Diagnosis not present

## 2021-05-31 DIAGNOSIS — C3431 Malignant neoplasm of lower lobe, right bronchus or lung: Secondary | ICD-10-CM | POA: Diagnosis present

## 2021-05-31 DIAGNOSIS — Z5111 Encounter for antineoplastic chemotherapy: Secondary | ICD-10-CM

## 2021-05-31 NOTE — Progress Notes (Signed)
[facility] Telephone:([phone]   Fax:(336) [phone]  OFFICE PROGRESS NOTE  [name], PA-C [address] [place] [address]  DIAGNOSIS: Recurrent non-small cell lung cancer initially diagnosed as stage Ib (T2 a, N0, M0) non-small cell lung cancer, adenocarcinoma in September 2019.  The patient had bilateral pulmonary nodules including 2 nodules in the left upper lobe and 1 nodule in the right middle lobe in December 2021. Biomarker Findings Microsatellite status - MS-Stable Tumor Mutational Burden - 4 Muts/Mb Genomic Findings For a complete list of the genes assayed, please refer to the Appendix. 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  PRIOR THERAPY: Status post right lower lobectomy with lymph node dissection on February 10, 2018 under the care of Dr. Servando Snare.  CURRENT THERAPY: Tagrisso 80 mg p.o. daily.  First dose started on September 12, 2020.  Status post 9 months of treatment.  INTERVAL HISTORY: Shelby Green 71 y.o. female returns to the clinic today for follow-up visit.  The patient is feeling fine today with no concerning complaints except for intermittent pain in the right flank area from the previous surgical scar.  She denied having any current shortness of breath, cough or hemoptysis.  She has no recent weight loss or night sweats.  She has no nausea, vomiting, diarrhea or constipation.  She has no headache or visual changes.  She denied having any concerning issues with her current treatment with Tagrisso.  The patient had repeat CT scan of the chest performed recently and she is here for evaluation and discussion of her scan results.   MEDICAL HISTORY: Past Medical History:  Diagnosis Date   Allergy    Back pain    Cancer ([facility])    Hypertension    Hypertension    Lung  cancer ([facility]) 01/2018   Lung mass    Wears dentures    Wears glasses     ALLERGIES:  is allergic to metoprolol and penicillin g.  MEDICATIONS:  Current Outpatient Medications  Medication Sig Dispense Refill   doxazosin (CARDURA) 2 MG tablet Take 2 mg by mouth daily.     ibuprofen (ADVIL) 600 MG tablet Take 1 tablet (600 mg total) by mouth every 6 (six) hours as needed. 30 tablet 0   latanoprost (XALATAN) 0.005 % ophthalmic solution      osimertinib mesylate (TAGRISSO) 80 MG tablet Take 1 tablet (80 mg total) by mouth daily. Faxed to [facility] and [phone].    30 tablet 3   No current facility-administered medications for this visit.    SURGICAL HISTORY:  Past Surgical History:  Procedure Laterality Date   COLONOSCOPY W/ BIOPSIES AND POLYPECTOMY     MULTIPLE TOOTH EXTRACTIONS     VIDEO ASSISTED THORACOSCOPY (VATS)/WEDGE RESECTION Right 02/14/2018   Procedure: RIGHT  VIDEO ASSISTED THORACOSCOPY (VATS) WITH RIGHT LOWER LOBE (RLL) WEDGE RESECTION, COMPLETION RLL LOBECTOMY, LYMPH NODE DISSECTION, ON-Q PLACEMENT;  Surgeon: Grace Isaac, MD;  Location: [facility];  Service: Thoracic;  Laterality: Right;   VIDEO BRONCHOSCOPY N/A 02/14/2018   Procedure: VIDEO BRONCHOSCOPY;  Surgeon: Grace Isaac, MD;  Location: [facility];  Service: Thoracic;  Laterality: N/A;    REVIEW OF SYSTEMS:  Constitutional: negative Eyes: negative Ears, nose, mouth, throat, and face: negative Respiratory: positive for pleurisy/chest pain Cardiovascular: negative Gastrointestinal: negative Genitourinary:negative Integument/breast: negative Hematologic/lymphatic:  negative Musculoskeletal:negative Neurological: negative Behavioral/Psych: negative Endocrine: negative Allergic/Immunologic: negative   PHYSICAL EXAMINATION: General appearance: alert, cooperative, and no distress Head: Normocephalic, without obvious abnormality, atraumatic Neck: no adenopathy, no JVD, supple, symmetrical, trachea midline, and  thyroid not enlarged, symmetric, no tenderness/mass/nodules Lymph nodes: Cervical, supraclavicular, and axillary nodes normal. Resp: clear to auscultation bilaterally Back: symmetric, no curvature. ROM normal. No CVA tenderness. Cardio: regular rate and rhythm, S1, S2 normal, no murmur, click, rub or gallop GI: soft, non-tender; bowel sounds normal; no masses,  no organomegaly Extremities: extremities normal, atraumatic, no cyanosis or edema Neurologic: Alert and oriented X 3, normal strength and tone. Normal symmetric reflexes. Normal coordination and gait  ECOG PERFORMANCE STATUS: 1 - Symptomatic but completely ambulatory  Blood pressure (!) 149/93, pulse 79, temperature 97.9 F (36.6 C), temperature source Axillary, resp. rate 16, height '5\' 4"'  (1.626 m), weight 145 lb (65.8 kg), SpO2 100 %.  LABORATORY DATA: Lab Results  Component Value Date   WBC 3.9 (L) 05/26/2021   HGB 12.0 05/26/2021   HCT 36.4 05/26/2021   MCV 90.3 05/26/2021   PLT 147 (L) 05/26/2021      Chemistry      Component Value Date/Time   NA 142 05/26/2021 1117   K 3.9 05/26/2021 1117   CL 108 05/26/2021 1117   CO2 29 05/26/2021 1117   BUN 15 05/26/2021 1117   CREATININE 1.09 (H) 05/26/2021 1117   CREATININE 0.83 08/06/2015 1616      Component Value Date/Time   CALCIUM 9.0 05/26/2021 1117   ALKPHOS 57 05/26/2021 1117   AST 27 05/26/2021 1117   ALT 26 05/26/2021 1117   BILITOT 0.7 05/26/2021 1117       RADIOGRAPHIC STUDIES: CT Chest W Contrast  Result Date: 05/27/2021 CLINICAL DATA:  71 year old female with history of non-small cell lung cancer. Status post right upper lobectomy. Staging examination. EXAM: CT CHEST WITH CONTRAST TECHNIQUE: Multidetector CT imaging of the chest was performed during intravenous contrast administration. CONTRAST:  76m OMNIPAQUE IOHEXOL 350 MG/ML SOLN COMPARISON:  CT the chest, abdomen and pelvis 03/01/2021. FINDINGS: Cardiovascular: Heart size is normal. There is no  significant pericardial fluid, thickening or pericardial calcification. Aortic atherosclerosis. No definite coronary artery calcifications. Mediastinum/Nodes: No pathologically enlarged mediastinal or hilar lymph nodes. Esophagus is unremarkable in appearance. No axillary lymphadenopathy. Lungs/Pleura: Postoperative changes of prior right lower lobectomy. Compensatory hyperexpansion of the right upper and middle lobes. Mild postoperative scarring and chronic pleural thickening in the lateral aspect of the mid to lower right hemithorax, stable compared to the prior study. Trace amount of pleural thickening posteriorly in the right hemithorax is also unchanged. However, in the periphery of the left lower lobe (axial image 74 of series 5) there is a new 8 mm subpleural nodule. No other definite suspicious appearing pulmonary nodules or masses are noted. No acute consolidative airspace disease. No pleural effusions. Upper Abdomen: Low-attenuation lesion in the upper pole of the left kidney, compatible with a simple cyst. Musculoskeletal: There are no aggressive appearing lytic or blastic lesions noted in the visualized portions of the skeleton. IMPRESSION: 1. New left lower lobe subpleural pulmonary nodule measuring 8 mm. This is nonspecific, but warrants close attention on follow-up studies. Repeat noncontrast chest CT is recommended in 2-3 months to ensure the stability or regression of this finding. 2. Aortic atherosclerosis. These results will be called to the ordering clinician or representative by the Radiologist Assistant, and communication documented in the PACS or [employer] Aortic Atherosclerosis (ICD10-I70.0).  Electronically Signed   By: Vinnie Langton M.D.   On: 05/27/2021 06:34    ASSESSMENT AND PLAN: This is a very pleasant 71 years old African-American female with highly suspicious recurrent non-small cell lung cancer, initially diagnosed as a stage Ib (T2 a, N0, M0) non-small cell lung cancer,  adenocarcinoma status post right lower lobectomy with lymph node dissection in September 2019 under the care of Dr. Servando Snare.  The patient had evidence for disease recurrence in December 2021 presenting with 2 pulmonary nodules in the left upper lobe as well as 1 nodule in the right middle lobe. 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  Her PD-L1 expression is 1%. The patient was found on previous blood work to have evidence for disease progression with enlarging pulmonary nodules. She started treatment with Tagrisso 80 mg p.o. daily on September 12, 2020.  She is status post 9 months of treatment. The patient continues to tolerate her treatment with Tagrisso fairly well with no concerning adverse effects.  She denied having any skin rash or diarrhea. She had repeat CT scan of the chest performed recently.  I personally and independently reviewed the scan images and discussed the results and showed the images to the patient today. Her scan showed stable disease except for new left lower lobe subpleural pulmonary nodule that is nonspecific and warrant continuous observation on upcoming imaging studies. I recommended for the patient to continue her current treatment with Tagrisso with the same dose. We will continue to monitor the new pulmonary nodule closely on the upcoming imaging studies which is expected to be in around 3 months from now. The patient will come back for follow-up visit in 6 weeks for evaluation and repeat blood work. She was advised to call immediately if she has any other concerning symptoms in the interval. The patient voices understanding of current disease status and treatment options and is in agreement with the current care plan.  All questions were answered. The patient knows to call the clinic with any problems, questions or concerns. We can certainly see the patient much sooner if necessary.  Disclaimer: This note was  dictated with voice recognition software. Similar sounding words can inadvertently be transcribed and may not be corrected upon review.

## 2021-05-31 NOTE — Telephone Encounter (Signed)
Opened in error

## 2021-06-24 ENCOUNTER — Telehealth: Payer: Self-pay | Admitting: Internal Medicine

## 2021-06-24 NOTE — Telephone Encounter (Signed)
Scheduled per 01/09 los, patient has been called and voicemail was left.

## 2021-07-06 ENCOUNTER — Telehealth: Payer: Self-pay

## 2021-07-06 NOTE — Telephone Encounter (Signed)
Patient notified of prior authorization approval for Tagrisso 80mg  tablets. Medication is approved through 01/01/2022. No other needs voiced at this time.

## 2021-07-09 ENCOUNTER — Telehealth: Payer: Self-pay

## 2021-07-09 NOTE — Telephone Encounter (Signed)
Patient called and had a question about medication interaction. She was prescribed a Z-pak from urgent care yesterday for sinus infection, and wanted to know if it will interact with her current medications. I assured her that the provider would not prescribe her something that would interact with her other medications. I also informed her to make sure she is drinking plenty of fluids with this medication. Patient verbalized understanding and had no further questions or concerns.

## 2021-07-12 ENCOUNTER — Inpatient Hospital Stay: Payer: Medicare HMO | Admitting: Internal Medicine

## 2021-07-12 ENCOUNTER — Other Ambulatory Visit: Payer: Self-pay

## 2021-07-12 ENCOUNTER — Inpatient Hospital Stay: Payer: Medicare HMO | Attending: Internal Medicine

## 2021-07-12 VITALS — BP 156/88 | HR 80 | Temp 97.4°F | Resp 18 | Ht 64.0 in | Wt 146.3 lb

## 2021-07-12 DIAGNOSIS — R918 Other nonspecific abnormal finding of lung field: Secondary | ICD-10-CM | POA: Diagnosis not present

## 2021-07-12 DIAGNOSIS — C3491 Malignant neoplasm of unspecified part of right bronchus or lung: Secondary | ICD-10-CM

## 2021-07-12 DIAGNOSIS — Z880 Allergy status to penicillin: Secondary | ICD-10-CM | POA: Diagnosis not present

## 2021-07-12 DIAGNOSIS — M2550 Pain in unspecified joint: Secondary | ICD-10-CM | POA: Diagnosis not present

## 2021-07-12 DIAGNOSIS — M25511 Pain in right shoulder: Secondary | ICD-10-CM | POA: Diagnosis not present

## 2021-07-12 DIAGNOSIS — C3431 Malignant neoplasm of lower lobe, right bronchus or lung: Secondary | ICD-10-CM | POA: Diagnosis present

## 2021-07-12 DIAGNOSIS — I10 Essential (primary) hypertension: Secondary | ICD-10-CM | POA: Insufficient documentation

## 2021-07-12 DIAGNOSIS — Z79899 Other long term (current) drug therapy: Secondary | ICD-10-CM | POA: Diagnosis not present

## 2021-07-12 DIAGNOSIS — Z5111 Encounter for antineoplastic chemotherapy: Secondary | ICD-10-CM

## 2021-07-12 LAB — CMP (CANCER CENTER ONLY)
ALT: 15 U/L (ref 0–44)
AST: 18 U/L (ref 15–41)
Albumin: 3.8 g/dL (ref 3.5–5.0)
Alkaline Phosphatase: 57 U/L (ref 38–126)
Anion gap: 4 — ABNORMAL LOW (ref 5–15)
BUN: 24 mg/dL — ABNORMAL HIGH (ref 8–23)
CO2: 30 mmol/L (ref 22–32)
Calcium: 8.9 mg/dL (ref 8.9–10.3)
Chloride: 108 mmol/L (ref 98–111)
Creatinine: 1.12 mg/dL — ABNORMAL HIGH (ref 0.44–1.00)
GFR, Estimated: 53 mL/min — ABNORMAL LOW (ref >60)
Glucose, Bld: 80 mg/dL (ref 70–99)
Potassium: 4 mmol/L (ref 3.5–5.1)
Sodium: 142 mmol/L (ref 135–145)
Total Bilirubin: 0.4 mg/dL (ref 0.3–1.2)
Total Protein: 6.5 g/dL (ref 6.5–8.1)

## 2021-07-12 LAB — CBC WITH DIFFERENTIAL (CANCER CENTER ONLY)
Abs Immature Granulocytes: 0.02 10*3/uL (ref 0.00–0.07)
Basophils Absolute: 0 10*3/uL (ref 0.0–0.1)
Basophils Relative: 1 %
Eosinophils Absolute: 0.2 10*3/uL (ref 0.0–0.5)
Eosinophils Relative: 4 %
HCT: 33.6 % — ABNORMAL LOW (ref 36.0–46.0)
Hemoglobin: 10.7 g/dL — ABNORMAL LOW (ref 12.0–15.0)
Immature Granulocytes: 1 %
Lymphocytes Relative: 39 %
Lymphs Abs: 1.7 10*3/uL (ref 0.7–4.0)
MCH: 29.3 pg (ref 26.0–34.0)
MCHC: 31.8 g/dL (ref 30.0–36.0)
MCV: 92.1 fL (ref 80.0–100.0)
Monocytes Absolute: 0.4 10*3/uL (ref 0.1–1.0)
Monocytes Relative: 9 %
Neutro Abs: 2.1 10*3/uL (ref 1.7–7.7)
Neutrophils Relative %: 46 %
Platelet Count: 209 10*3/uL (ref 150–400)
RBC: 3.65 MIL/uL — ABNORMAL LOW (ref 3.87–5.11)
RDW: 13.5 % (ref 11.5–15.5)
WBC Count: 4.4 10*3/uL (ref 4.0–10.5)
nRBC: 0 % (ref 0.0–0.2)

## 2021-07-12 NOTE — Progress Notes (Signed)
[place] Telephone:(336) [phone]   Fax:(336) [phone]  OFFICE PROGRESS NOTE  [name], PA-C [address] [place] [address]  DIAGNOSIS: Recurrent non-small cell lung cancer initially diagnosed as stage Ib (T2 a, N0, M0) non-small cell lung cancer, adenocarcinoma in September 2019.  The patient had bilateral pulmonary nodules including 2 nodules in the left upper lobe and 1 nodule in the right middle lobe in December 2021. Biomarker Findings Microsatellite status - MS-Stable Tumor Mutational Burden - 4 Muts/Mb Genomic Findings For a complete list of the genes assayed, please refer to the Appendix. 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  PRIOR THERAPY: Status post right lower lobectomy with lymph node dissection on February 10, 2018 under the care of Dr. Servando Snare.  CURRENT THERAPY: Tagrisso 80 mg p.o. daily.  First dose started on September 12, 2020.  Status post 10 months of treatment.  INTERVAL HISTORY: Shelby Green 71 y.o. female returns to the clinic today for follow-up visit.  The patient is feeling fine today with no concerning complaints except for right shoulder pain.  It started around 2 weeks ago.  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  She did not see her primary care physician or orthopedic surgeon.  She denied having any shortness of breath, cough or hemoptysis.  She denied having any fever or chills.  She has no nausea, vomiting, diarrhea or constipation.  She has no headache or visual changes.  She is here today for evaluation and repeat blood work.   MEDICAL HISTORY: Past Medical History:  Diagnosis Date   Allergy    Back pain    Cancer ([place])    Hypertension    Hypertension    Lung cancer ([place]) 01/2018   Lung mass    Wears dentures    Wears  glasses     ALLERGIES:  is allergic to metoprolol and penicillin g.  MEDICATIONS:  Current Outpatient Medications  Medication Sig Dispense Refill   doxazosin (CARDURA) 2 MG tablet Take 2 mg by mouth daily.     ibuprofen (ADVIL) 600 MG tablet Take 1 tablet (600 mg total) by mouth every 6 (six) hours as needed. 30 tablet 0   latanoprost (XALATAN) 0.005 % ophthalmic solution      osimertinib mesylate (TAGRISSO) 80 MG tablet Take 1 tablet (80 mg total) by mouth daily. Faxed to [facility] and [phone].    30 tablet 3   No current facility-administered medications for this visit.    SURGICAL HISTORY:  Past Surgical History:  Procedure Laterality Date   COLONOSCOPY W/ BIOPSIES AND POLYPECTOMY     MULTIPLE TOOTH EXTRACTIONS     VIDEO ASSISTED THORACOSCOPY (VATS)/WEDGE RESECTION Right 02/14/2018   Procedure: RIGHT  VIDEO ASSISTED THORACOSCOPY (VATS) WITH RIGHT LOWER LOBE (RLL) WEDGE RESECTION, COMPLETION RLL LOBECTOMY, LYMPH NODE DISSECTION, ON-Q PLACEMENT;  Surgeon: Grace Isaac, MD;  Location: [facility];  Service: Thoracic;  Laterality: Right;   VIDEO BRONCHOSCOPY N/A 02/14/2018   Procedure: VIDEO BRONCHOSCOPY;  Surgeon: Grace Isaac, MD;  Location: [facility];  Service: Thoracic;  Laterality: N/A;    REVIEW OF SYSTEMS:  A comprehensive review of systems was negative except for: Musculoskeletal: positive for arthralgias   PHYSICAL EXAMINATION: General appearance: alert, cooperative, and no distress Head: Normocephalic, without obvious  abnormality, atraumatic Neck: no adenopathy, no JVD, supple, symmetrical, trachea midline, and thyroid not enlarged, symmetric, no tenderness/mass/nodules Lymph nodes: Cervical, supraclavicular, and axillary nodes normal. Resp: clear to auscultation bilaterally Back: symmetric, no curvature. ROM normal. No CVA tenderness. Cardio: regular rate and rhythm, S1, S2 normal, no murmur, click, rub or gallop GI: soft, non-tender; bowel sounds normal; no masses,   no organomegaly Extremities: extremities normal, atraumatic, no cyanosis or edema  ECOG PERFORMANCE STATUS: 1 - Symptomatic but completely ambulatory  Blood pressure (!) 156/88, pulse 80, temperature (!) 97.4 F (36.3 C), temperature source Tympanic, resp. rate 18, height _0  (1.626 m), weight 146 lb 4.8 oz (66.4 kg), SpO2 99 %.  LABORATORY DATA: Lab Results  Component Value Date   WBC 4.4 07/12/2021   HGB 10.7 (L) 07/12/2021   HCT 33.6 (L) 07/12/2021   MCV 92.1 07/12/2021   PLT 209 07/12/2021      Chemistry      Component Value Date/Time   NA 142 05/26/2021 1117   K 3.9 05/26/2021 1117   CL 108 05/26/2021 1117   CO2 29 05/26/2021 1117   BUN 15 05/26/2021 1117   CREATININE 1.09 (H) 05/26/2021 1117   CREATININE 0.83 08/06/2015 1616      Component Value Date/Time   CALCIUM 9.0 05/26/2021 1117   ALKPHOS 57 05/26/2021 1117   AST 27 05/26/2021 1117   ALT 26 05/26/2021 1117   BILITOT 0.7 05/26/2021 1117       RADIOGRAPHIC STUDIES: No results found.  ASSESSMENT AND PLAN: This is a very pleasant 70 years old African-American female with highly suspicious recurrent non-small cell lung cancer, initially diagnosed as a stage Ib (T2 a, N0, M0) non-small cell lung cancer, adenocarcinoma status post right lower lobectomy with lymph node dissection in September 2019 under the care of Dr. Servando Snare.  The patient had evidence for disease recurrence in December 2021 presenting with 2 pulmonary nodules in the left upper lobe as well as 1 nodule in the right middle lobe. 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  Her PD-L1 expression is 1%. The patient was found on previous blood work to have evidence for disease progression with enlarging pulmonary nodules. She started treatment with Tagrisso 80 mg p.o. daily on September 12, 2020.  She is status post 10 months of treatment. The patient has been tolerating this treatment well with  no concerning adverse effects. I recommended for her to continue her current treatment with Tagrisso with the same dose. I will see her back for follow-up visit in 1 months for evaluation and repeat blood work. For the right shoulder pain, I advised the patient to see her primary care physician or orthopedic surgeon to rule out rotator cuff problem or arthritis of the right shoulder. She was advised to call immediately if she has any other concerning symptoms in the interval. The patient voices understanding of current disease status and treatment options and is in agreement with the current care plan.  All questions were answered. The patient knows to call the clinic with any problems, questions or concerns. We can certainly see the patient much sooner if necessary.  Disclaimer: This note was dictated with voice recognition software. Similar sounding words can inadvertently be transcribed and may not be corrected upon review.

## 2021-07-23 ENCOUNTER — Telehealth: Payer: Self-pay | Admitting: Internal Medicine

## 2021-07-23 NOTE — Telephone Encounter (Signed)
Scheduled per los, patient has been called and notified of upcoming appointments. ?

## 2021-08-10 ENCOUNTER — Inpatient Hospital Stay: Payer: Medicare HMO | Attending: Internal Medicine

## 2021-08-10 ENCOUNTER — Other Ambulatory Visit: Payer: Self-pay

## 2021-08-10 ENCOUNTER — Inpatient Hospital Stay: Payer: Medicare HMO | Admitting: Internal Medicine

## 2021-08-10 VITALS — BP 162/98 | HR 71 | Temp 97.6°F | Resp 19 | Ht 64.0 in | Wt 145.2 lb

## 2021-08-10 DIAGNOSIS — C3490 Malignant neoplasm of unspecified part of unspecified bronchus or lung: Secondary | ICD-10-CM

## 2021-08-10 DIAGNOSIS — Z79899 Other long term (current) drug therapy: Secondary | ICD-10-CM | POA: Diagnosis not present

## 2021-08-10 DIAGNOSIS — C3491 Malignant neoplasm of unspecified part of right bronchus or lung: Secondary | ICD-10-CM

## 2021-08-10 DIAGNOSIS — C3431 Malignant neoplasm of lower lobe, right bronchus or lung: Secondary | ICD-10-CM | POA: Insufficient documentation

## 2021-08-10 DIAGNOSIS — Z902 Acquired absence of lung [part of]: Secondary | ICD-10-CM | POA: Diagnosis not present

## 2021-08-10 DIAGNOSIS — Z5111 Encounter for antineoplastic chemotherapy: Secondary | ICD-10-CM | POA: Diagnosis not present

## 2021-08-10 LAB — CBC WITH DIFFERENTIAL (CANCER CENTER ONLY)
Abs Immature Granulocytes: 0.01 10*3/uL (ref 0.00–0.07)
Basophils Absolute: 0 10*3/uL (ref 0.0–0.1)
Basophils Relative: 1 %
Eosinophils Absolute: 0.2 10*3/uL (ref 0.0–0.5)
Eosinophils Relative: 5 %
HCT: 36.1 % (ref 36.0–46.0)
Hemoglobin: 11.5 g/dL — ABNORMAL LOW (ref 12.0–15.0)
Immature Granulocytes: 0 %
Lymphocytes Relative: 34 %
Lymphs Abs: 1.4 10*3/uL (ref 0.7–4.0)
MCH: 29.3 pg (ref 26.0–34.0)
MCHC: 31.9 g/dL (ref 30.0–36.0)
MCV: 92.1 fL (ref 80.0–100.0)
Monocytes Absolute: 0.3 10*3/uL (ref 0.1–1.0)
Monocytes Relative: 8 %
Neutro Abs: 2.1 10*3/uL (ref 1.7–7.7)
Neutrophils Relative %: 52 %
Platelet Count: 132 10*3/uL — ABNORMAL LOW (ref 150–400)
RBC: 3.92 MIL/uL (ref 3.87–5.11)
RDW: 14.7 % (ref 11.5–15.5)
WBC Count: 4 10*3/uL (ref 4.0–10.5)
nRBC: 0 % (ref 0.0–0.2)

## 2021-08-10 LAB — CMP (CANCER CENTER ONLY)
ALT: 24 U/L (ref 0–44)
AST: 23 U/L (ref 15–41)
Albumin: 4 g/dL (ref 3.5–5.0)
Alkaline Phosphatase: 56 U/L (ref 38–126)
Anion gap: 5 (ref 5–15)
BUN: 17 mg/dL (ref 8–23)
CO2: 29 mmol/L (ref 22–32)
Calcium: 9.2 mg/dL (ref 8.9–10.3)
Chloride: 108 mmol/L (ref 98–111)
Creatinine: 1.09 mg/dL — ABNORMAL HIGH (ref 0.44–1.00)
GFR, Estimated: 55 mL/min — ABNORMAL LOW (ref >60)
Glucose, Bld: 83 mg/dL (ref 70–99)
Potassium: 4 mmol/L (ref 3.5–5.1)
Sodium: 142 mmol/L (ref 135–145)
Total Bilirubin: 0.6 mg/dL (ref 0.3–1.2)
Total Protein: 6.5 g/dL (ref 6.5–8.1)

## 2021-08-10 NOTE — Progress Notes (Signed)
?    [facility] ?[phone]   [phone] ? ?OFFICE PROGRESS NOTE ? ?[name], PA-C ?[address] ?[place] [address] ? ?DIAGNOSIS: Recurrent non-small cell lung cancer initially diagnosed as stage Ib (T2 a, N0, M0) non-small cell lung cancer, adenocarcinoma in September 2019.  The patient had bilateral pulmonary nodules including 2 nodules in the left upper lobe and 1 nodule in the right middle lobe in December 2021. ?Biomarker Findings ?Microsatellite status - MS-Stable ?Tumor Mutational Burden - 4 Muts/Mb ?Genomic Findings ?For a complete list of the genes assayed, please refer to the Appendix. 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 ?7 Disease relevant genes with no reportable ?alterations: ALK, BRAF, ERBB2, KRAS, MET, RET, ROS1 ? ?PDL1 Expression: 1%. ? ?PRIOR THERAPY: Status post right lower lobectomy with lymph node dissection on February 10, 2018 under the care of Dr. Servando Snare. ? ?CURRENT THERAPY: Tagrisso 80 mg p.o. daily.  First dose started on September 12, 2020.  Status post 11 months of treatment. ? ?INTERVAL HISTORY: ?Shelby Green 71 y.o. female returns to the clinic today for follow-up visit.  The patient is feeling fine today with no concerning complaints except for the aching pain on the left arm.  She was seen by her primary care physician and was given prescription for pain medication but she stopped it after 1 day because she was getting confused and having some hallucination.  She denied having any current chest pain, shortness of breath, cough or hemoptysis.  She denied having any fever or chills.  She has no nausea, vomiting, diarrhea or constipation.  She has no headache or visual changes.  She continues to tolerate her treatment with Tagrisso fairly well. ? ? ?MEDICAL HISTORY: ?Past Medical History:  ?Diagnosis Date  ? Allergy   ? Back pain   ? Cancer [facility]   ? Hypertension   ? Hypertension   ?  Lung cancer ([place]) 01/2018  ? Lung mass   ? Wears dentures   ? Wears glasses   ? ? ?ALLERGIES:  is allergic to metoprolol and penicillin g. ? ?MEDICATIONS:  ?Current Outpatient Medications  ?Medication Sig Dispense Refill  ? doxazosin (CARDURA) 2 MG tablet Take 2 mg by mouth daily.    ? ibuprofen (ADVIL) 600 MG tablet Take 1 tablet (600 mg total) by mouth every 6 (six) hours as needed. 30 tablet 0  ? latanoprost (XALATAN) 0.005 % ophthalmic solution     ? osimertinib mesylate (TAGRISSO) 80 MG tablet Take 1 tablet (80 mg total) by mouth daily. Faxed to [facility] and [phone].  ?  30 tablet 3  ? ?No current facility-administered medications for this visit.  ? ? ?SURGICAL HISTORY:  ?Past Surgical History:  ?Procedure Laterality Date  ? COLONOSCOPY W/ BIOPSIES AND POLYPECTOMY    ? MULTIPLE TOOTH EXTRACTIONS    ? VIDEO ASSISTED THORACOSCOPY (VATS)/WEDGE RESECTION Right 02/14/2018  ? Procedure: RIGHT  VIDEO ASSISTED THORACOSCOPY (VATS) WITH RIGHT LOWER LOBE (RLL) WEDGE RESECTION, COMPLETION RLL LOBECTOMY, LYMPH NODE DISSECTION, ON-Q PLACEMENT;  Surgeon: Grace Isaac, MD;  Location: [facility];  Service: Thoracic;  Laterality: Right;  ? VIDEO BRONCHOSCOPY N/A 02/14/2018  ? Procedure: VIDEO BRONCHOSCOPY;  Surgeon: Grace Isaac, MD;  Location: [facility] OR;  Service: Thoracic;  Laterality: N/A;  ? ? ?REVIEW OF SYSTEMS:  A comprehensive review of systems was negative except for: Musculoskeletal: positive for arthralgias  ? ?PHYSICAL EXAMINATION: General appearance: alert, cooperative, and no  distress ?Head: Normocephalic, without obvious abnormality, atraumatic ?Neck: no adenopathy, no JVD, supple, symmetrical, trachea midline, and thyroid not enlarged, symmetric, no tenderness/mass/nodules ?Lymph nodes: Cervical, supraclavicular, and axillary nodes normal. ?Resp: clear to auscultation bilaterally ?Back: symmetric, no curvature. ROM normal. No CVA tenderness. ?Cardio: regular rate and rhythm, S1, S2 normal, no murmur, click, rub  or gallop ?GI: soft, non-tender; bowel sounds normal; no masses,  no organomegaly ?Extremities: extremities normal, atraumatic, no cyanosis or edema ? ?ECOG PERFORMANCE STATUS: 1 - Symptomatic but completely ambulatory ? ?Blood pressure (!) 162/98, pulse 71, temperature 97.6 ?F (36.4 ?C), temperature source Tympanic, resp. rate 19, height '5\' 4"'  (1.626 m), weight 145 lb 3.2 oz (65.9 kg), SpO2 100 %. ? ?LABORATORY DATA: ?Lab Results  ?Component Value Date  ? WBC 4.0 08/10/2021  ? HGB 11.5 (L) 08/10/2021  ? HCT 36.1 08/10/2021  ? MCV 92.1 08/10/2021  ? PLT 132 (L) 08/10/2021  ? ? ?  Chemistry   ?   ?Component Value Date/Time  ? NA 142 07/12/2021 1502  ? K 4.0 07/12/2021 1502  ? CL 108 07/12/2021 1502  ? CO2 30 07/12/2021 1502  ? BUN 24 (H) 07/12/2021 1502  ? CREATININE 1.12 (H) 07/12/2021 1502  ? CREATININE 0.83 08/06/2015 1616  ?    ?Component Value Date/Time  ? CALCIUM 8.9 07/12/2021 1502  ? ALKPHOS 57 07/12/2021 1502  ? AST 18 07/12/2021 1502  ? ALT 15 07/12/2021 1502  ? BILITOT 0.4 07/12/2021 1502  ?  ? ? ? ?RADIOGRAPHIC STUDIES: ?No results found. ? ?ASSESSMENT AND PLAN: This is a very pleasant 71 years old African-American female with highly suspicious recurrent non-small cell lung cancer, initially diagnosed as a stage Ib (T2 a, N0, M0) non-small cell lung cancer, adenocarcinoma status post right lower lobectomy with lymph node dissection in September 2019 under the care of Dr. Servando Snare.  The patient had evidence for disease recurrence in December 2021 presenting with 2 pulmonary nodules in the left upper lobe as well as 1 nodule in the right middle lobe. 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  Her PD-L1 expression is 1%. ?The patient was found on previous imaging studies to have evidence for disease progression with enlarging pulmonary nodules. ?She started treatment with Tagrisso 80 mg p.o. daily on September 12, 2020.  She is status post 11 months  of treatment. ?The patient continues to tolerate this treatment well with no concerning adverse effects. ?I recommended for the patient to continue her current treatment with Tagrisso with the same dose. ?I will see her back for follow-up visit in around 2-3 months with repeat CT scan of the chest for restaging of her disease. ?The patient was advised to call immediately if she has any concerning symptoms in the interval. ?The patient voices understanding of current disease status and treatment options and is in agreement with the current care plan. ? ?All questions were answered. The patient knows to call the clinic with any problems, questions or concerns. We can certainly see the patient much sooner if necessary. ? ?Disclaimer: This note was dictated with voice recognition software. Similar sounding words can inadvertently be transcribed and may not be corrected upon review. ? ? ?  ?  ?

## 2021-10-19 ENCOUNTER — Ambulatory Visit (HOSPITAL_COMMUNITY)
Admission: RE | Admit: 2021-10-19 | Discharge: 2021-10-19 | Disposition: A | Discharge status: Home / Self Care | Payer: Medicare HMO | Source: Ambulatory Visit | Attending: Internal Medicine | Admitting: Internal Medicine

## 2021-10-19 ENCOUNTER — Other Ambulatory Visit: Payer: Self-pay

## 2021-10-19 ENCOUNTER — Inpatient Hospital Stay: Payer: Medicare HMO | Attending: Internal Medicine

## 2021-10-19 ENCOUNTER — Inpatient Hospital Stay: Payer: Medicare HMO

## 2021-10-19 ENCOUNTER — Encounter: Payer: Self-pay | Admitting: Internal Medicine

## 2021-10-19 DIAGNOSIS — C3431 Malignant neoplasm of lower lobe, right bronchus or lung: Secondary | ICD-10-CM | POA: Insufficient documentation

## 2021-10-19 DIAGNOSIS — C3490 Malignant neoplasm of unspecified part of unspecified bronchus or lung: Secondary | ICD-10-CM | POA: Insufficient documentation

## 2021-10-19 LAB — CMP (CANCER CENTER ONLY)
ALT: 35 U/L (ref 0–44)
AST: 21 U/L (ref 15–41)
Albumin: 4.1 g/dL (ref 3.5–5.0)
Alkaline Phosphatase: 59 U/L (ref 38–126)
Anion gap: 4 — ABNORMAL LOW (ref 5–15)
BUN: 26 mg/dL — ABNORMAL HIGH (ref 8–23)
CO2: 31 mmol/L (ref 22–32)
Calcium: 9.5 mg/dL (ref 8.9–10.3)
Chloride: 107 mmol/L (ref 98–111)
Creatinine: 1.31 mg/dL — ABNORMAL HIGH (ref 0.44–1.00)
GFR, Estimated: 44 mL/min — ABNORMAL LOW (ref >60)
Glucose, Bld: 100 mg/dL — ABNORMAL HIGH (ref 70–99)
Potassium: 4.2 mmol/L (ref 3.5–5.1)
Sodium: 142 mmol/L (ref 135–145)
Total Bilirubin: 0.8 mg/dL (ref 0.3–1.2)
Total Protein: 6.5 g/dL (ref 6.5–8.1)

## 2021-10-19 LAB — CBC WITH DIFFERENTIAL (CANCER CENTER ONLY)
Abs Immature Granulocytes: 0.01 10*3/uL (ref 0.00–0.07)
Basophils Absolute: 0 10*3/uL (ref 0.0–0.1)
Basophils Relative: 1 %
Eosinophils Absolute: 0 10*3/uL (ref 0.0–0.5)
Eosinophils Relative: 1 %
HCT: 37.3 % (ref 36.0–46.0)
Hemoglobin: 12.4 g/dL (ref 12.0–15.0)
Immature Granulocytes: 0 %
Lymphocytes Relative: 30 %
Lymphs Abs: 1.7 10*3/uL (ref 0.7–4.0)
MCH: 30.5 pg (ref 26.0–34.0)
MCHC: 33.2 g/dL (ref 30.0–36.0)
MCV: 91.6 fL (ref 80.0–100.0)
Monocytes Absolute: 0.4 10*3/uL (ref 0.1–1.0)
Monocytes Relative: 7 %
Neutro Abs: 3.6 10*3/uL (ref 1.7–7.7)
Neutrophils Relative %: 61 %
Platelet Count: 163 10*3/uL (ref 150–400)
RBC: 4.07 MIL/uL (ref 3.87–5.11)
RDW: 14.3 % (ref 11.5–15.5)
WBC Count: 5.8 10*3/uL (ref 4.0–10.5)
nRBC: 0 % (ref 0.0–0.2)

## 2021-10-19 MED ORDER — IOHEXOL 300 MG/ML  SOLN
80.0000 mL | Freq: Once | INTRAMUSCULAR | Status: AC | PRN
  Administered 2021-10-19: 80 mL via INTRAVENOUS

## 2021-10-21 ENCOUNTER — Inpatient Hospital Stay: Payer: Medicare HMO | Attending: Internal Medicine | Admitting: Internal Medicine

## 2021-10-21 ENCOUNTER — Other Ambulatory Visit: Payer: Self-pay

## 2021-10-21 VITALS — BP 154/89 | HR 63 | Temp 98.6°F | Resp 17 | Wt 145.6 lb

## 2021-10-21 DIAGNOSIS — Z79899 Other long term (current) drug therapy: Secondary | ICD-10-CM | POA: Diagnosis not present

## 2021-10-21 DIAGNOSIS — Z902 Acquired absence of lung [part of]: Secondary | ICD-10-CM | POA: Diagnosis not present

## 2021-10-21 DIAGNOSIS — C3412 Malignant neoplasm of upper lobe, left bronchus or lung: Secondary | ICD-10-CM | POA: Diagnosis present

## 2021-10-21 DIAGNOSIS — C3490 Malignant neoplasm of unspecified part of unspecified bronchus or lung: Secondary | ICD-10-CM

## 2021-10-21 DIAGNOSIS — C342 Malignant neoplasm of middle lobe, bronchus or lung: Secondary | ICD-10-CM | POA: Insufficient documentation

## 2021-10-21 NOTE — Progress Notes (Signed)
[place] Telephone:(336) [phone]   Fax:(336) [phone]  OFFICE PROGRESS NOTE  [name], PA-C [address] [place] [address]  DIAGNOSIS: Recurrent non-small cell lung cancer initially diagnosed as stage Ib (T2 a, N0, M0) non-small cell lung cancer, adenocarcinoma in September 2019.  The patient had bilateral pulmonary nodules including 2 nodules in the left upper lobe and 1 nodule in the right middle lobe in December 2021. Biomarker Findings Microsatellite status - MS-Stable Tumor Mutational Burden - 4 Muts/Mb Genomic Findings For a complete list of the genes assayed, please refer to the Appendix. 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 7 Disease relevant genes with no reportable alterations: ALK, BRAF, ERBB2, KRAS, MET, RET, ROS1  PDL1 Expression: 1%.  PRIOR THERAPY: Status post right lower lobectomy with lymph node dissection on February 10, 2018 under the care of Dr. Servando Snare.  CURRENT THERAPY: Tagrisso 80 mg p.o. daily.  First dose started on September 12, 2020.  Status post 13 months of treatment.  INTERVAL HISTORY: Shelby Green 71 y.o. female returns to the clinic today for follow-up visit.  The patient is feeling fine today with no concerning complaints.  She denied having any chest pain, shortness of breath, cough or hemoptysis.  She denied having any fever or chills.  She has no nausea, vomiting, diarrhea or constipation.  She has no headache or visual changes.  She denied having any weight loss or night sweats.  She continues to tolerate her treatment with Tagrisso fairly well.  She is here today for evaluation with repeat CT scan of the chest for restaging of her disease.  MEDICAL HISTORY: Past Medical History:  Diagnosis Date   Allergy    Back pain    Cancer ([place])    Hypertension    Hypertension    Lung cancer ([place]) 01/2018   Lung mass    Wears dentures    Wears glasses      ALLERGIES:  is allergic to metoprolol and penicillin g.  MEDICATIONS:  Current Outpatient Medications  Medication Sig Dispense Refill   doxazosin (CARDURA) 2 MG tablet Take 2 mg by mouth daily.     ibuprofen (ADVIL) 600 MG tablet Take 1 tablet (600 mg total) by mouth every 6 (six) hours as needed. 30 tablet 0   latanoprost (XALATAN) 0.005 % ophthalmic solution      osimertinib mesylate (TAGRISSO) 80 MG tablet Take 1 tablet (80 mg total) by mouth daily. Faxed to [facility] and [phone].    30 tablet 3   No current facility-administered medications for this visit.    SURGICAL HISTORY:  Past Surgical History:  Procedure Laterality Date   COLONOSCOPY W/ BIOPSIES AND POLYPECTOMY     MULTIPLE TOOTH EXTRACTIONS     VIDEO ASSISTED THORACOSCOPY (VATS)/WEDGE RESECTION Right 02/14/2018   Procedure: RIGHT  VIDEO ASSISTED THORACOSCOPY (VATS) WITH RIGHT LOWER LOBE (RLL) WEDGE RESECTION, COMPLETION RLL LOBECTOMY, LYMPH NODE DISSECTION, ON-Q PLACEMENT;  Surgeon: Grace Isaac, MD;  Location: [facility];  Service: Thoracic;  Laterality: Right;   VIDEO BRONCHOSCOPY N/A 02/14/2018   Procedure: VIDEO BRONCHOSCOPY;  Surgeon: Grace Isaac, MD;  Location: [facility];  Service: Thoracic;  Laterality: N/A;    REVIEW OF SYSTEMS:  Constitutional: negative Eyes: negative Ears, nose, mouth, throat, and face: negative Respiratory: negative Cardiovascular: negative Gastrointestinal: positive for diarrhea Genitourinary:negative Integument/breast: negative Hematologic/lymphatic: negative Musculoskeletal:negative Neurological: negative Behavioral/Psych: negative Endocrine: negative Allergic/Immunologic: negative   PHYSICAL EXAMINATION:  General appearance: alert, cooperative, and no distress Head: Normocephalic, without obvious abnormality, atraumatic Neck: no adenopathy, no JVD, supple, symmetrical, trachea midline, and thyroid not enlarged, symmetric, no tenderness/mass/nodules Lymph nodes: Cervical,  supraclavicular, and axillary nodes normal. Resp: clear to auscultation bilaterally Back: symmetric, no curvature. ROM normal. No CVA tenderness. Cardio: regular rate and rhythm, S1, S2 normal, no murmur, click, rub or gallop GI: soft, non-tender; bowel sounds normal; no masses,  no organomegaly Extremities: extremities normal, atraumatic, no cyanosis or edema Neurologic: Alert and oriented X 3, normal strength and tone. Normal symmetric reflexes. Normal coordination and gait  ECOG PERFORMANCE STATUS: 1 - Symptomatic but completely ambulatory  Blood pressure (!) 154/89, pulse 63, temperature 98.6 F (37 C), temperature source Tympanic, resp. rate 17, weight 145 lb 9 oz (66 kg), SpO2 100 %.  LABORATORY DATA: Lab Results  Component Value Date   WBC 5.8 10/19/2021   HGB 12.4 10/19/2021   HCT 37.3 10/19/2021   MCV 91.6 10/19/2021   PLT 163 10/19/2021      Chemistry      Component Value Date/Time   NA 142 10/19/2021 1309   K 4.2 10/19/2021 1309   CL 107 10/19/2021 1309   CO2 31 10/19/2021 1309   BUN 26 (H) 10/19/2021 1309   CREATININE 1.31 (H) 10/19/2021 1309   CREATININE 0.83 08/06/2015 1616      Component Value Date/Time   CALCIUM 9.5 10/19/2021 1309   ALKPHOS 59 10/19/2021 1309   AST 21 10/19/2021 1309   ALT 35 10/19/2021 1309   BILITOT 0.8 10/19/2021 1309       RADIOGRAPHIC STUDIES: CT Chest W Contrast  Result Date: 10/20/2021 CLINICAL DATA:  Primary Cancer Type: Lung Imaging Indication: Assess response to therapy Interval therapy since last imaging? Yes Initial Cancer Diagnosis Date: 02/14/2018; Established by: Biopsy-proven Detailed Pathology: Stage Ib non-small cell lung cancer, adenocarcinoma. Primary Tumor location:  Right lower lobe. Recurrence? Yes; Date(s) of recurrence: 04/09/2020; Established by: Imaging only Surgeries: Right lower lobectomy 02/14/2018. Chemotherapy: Yes; Ongoing? Yes; Tagrisso daily Immunotherapy? No Radiation therapy? No * Tracking Code: BO *  EXAM: CT CHEST WITH CONTRAST TECHNIQUE: Multidetector CT imaging of the chest was performed during intravenous contrast administration. RADIATION DOSE REDUCTION: This exam was performed according to the departmental dose-optimization program which includes automated exposure control, adjustment of the mA and/or kV according to patient size and/or use of iterative reconstruction technique. CONTRAST:  2m OMNIPAQUE IOHEXOL 300 MG/ML  SOLN COMPARISON:  Most recent CT chest 05/26/2021. 04/09/2020 PET-CT. FINDINGS: Cardiovascular: Atherosclerotic calcification of the aorta. Heart is at the upper limits of normal in size. No pericardial effusion. Mediastinum/Nodes: No pathologically enlarged mediastinal, hilar or axillary lymph nodes. Esophagus is grossly unremarkable. Lungs/Pleura: Right lower lobectomy. Associated pleuroparenchymal scarring. 3 mm posterior right middle lobe nodule (6/83), unchanged. Previously seen subpleural 8 mm left lower lobe nodule is no longer visualized. Trace loculated right pleural fluid. Trace left pleural effusion. Airway is unremarkable. Upper Abdomen: Subcentimeter low-attenuation lesion in the peripheral right hepatic lobe, too small to characterize. Visualized portions of the liver, gallbladder, adrenal glands and right kidney are otherwise unremarkable. Fluid density lesion in the upper pole left kidney measures 2.5 cm, compatible with a cyst. No follow-up necessary. Visualized portions of the spleen, pancreas, stomach and bowel are grossly unremarkable. Musculoskeletal: Degenerative changes in the spine. No worrisome lytic or sclerotic lesions. IMPRESSION: 1. Right lower lobectomy. No evidence of recurrent or metastatic disease. 2. Trace bilateral pleural fluid. 3.  Aortic atherosclerosis (ICD10-I70.0).  Electronically Signed   By: Lorin Picket M.D.   On: 10/20/2021 10:10    ASSESSMENT AND PLAN: This is a very pleasant 72 years old African-American female with highly suspicious  recurrent non-small cell lung cancer, initially diagnosed as a stage Ib (T2 a, N0, M0) non-small cell lung cancer, adenocarcinoma status post right lower lobectomy with lymph node dissection in September 2019 under the care of Dr. Servando Snare.  The patient had evidence for disease recurrence in December 2021 presenting with 2 pulmonary nodules in the left upper lobe as well as 1 nodule in the right middle lobe. 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  Her PD-L1 expression is 1%. The patient was found on previous imaging studies to have evidence for disease progression with enlarging pulmonary nodules. She started treatment with Tagrisso 80 mg p.o. daily on September 12, 2020.  She is status post 13 months of treatment. The patient has been tolerating her treatment fairly well with no concerning adverse effects except for intermittent diarrhea. She had repeat CT scan of the chest performed recently.  I personally and independently reviewed the scan and discussed the results with the patient today. Her scan showed no concerning findings for disease recurrence or metastasis. I recommended for her to continue her current treatment with Tagrisso 80 mg p.o. daily. I will see the patient back for follow-up visit in 2 months for evaluation and repeat blood work. She was advised to call immediately if she has any other concerning symptoms in the interval. The patient voices understanding of current disease status and treatment options and is in agreement with the current care plan.  All questions were answered. The patient knows to call the clinic with any problems, questions or concerns. We can certainly see the patient much sooner if necessary.  Disclaimer: This note was dictated with voice recognition software. Similar sounding words can inadvertently be transcribed and may not be corrected upon review.

## 2021-10-21 NOTE — Addendum Note (Signed)
Addended by: Ardeen Garland on: 10/21/2021 11:40 AM   Modules accepted: Orders

## 2021-11-14 IMAGING — MG MM DIGITAL SCREENING BILAT W/ TOMO AND CAD
8 series · 8 of 24 positions shown · non-contrast
Comparison: Previous exam(s).

CLINICAL DATA: Screening.

EXAM:
DIGITAL SCREENING BILATERAL MAMMOGRAM WITH TOMOSYNTHESIS AND CAD
TECHNIQUE: Bilateral screening digital craniocaudal and mediolateral oblique
mammograms were obtained. Bilateral screening digital breast
tomosynthesis was performed. The images were evaluated with
computer-aided detection.

[L CC synth-2D]
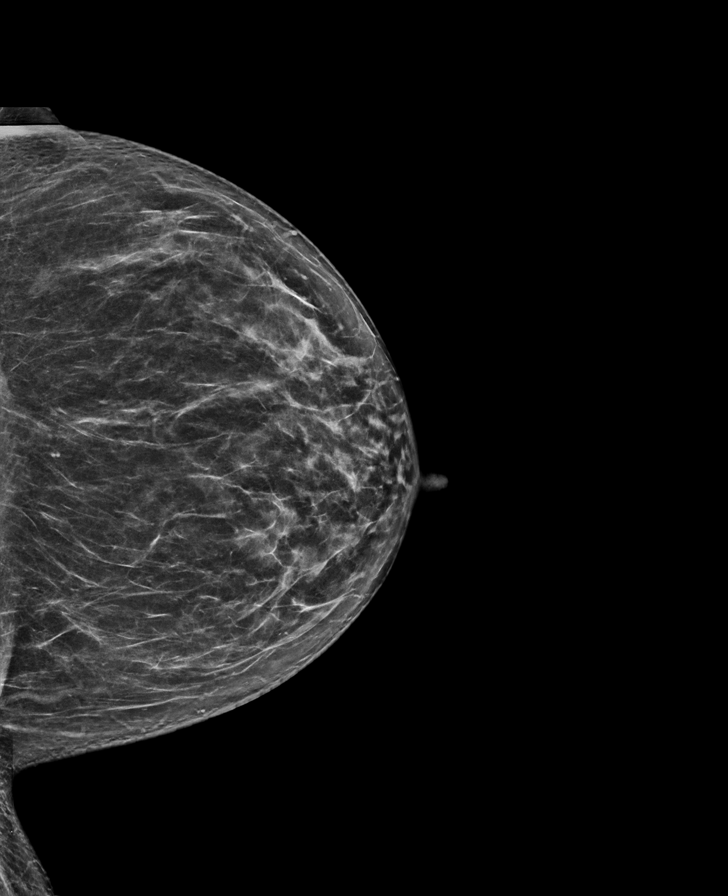

[R MLO synth-2D]
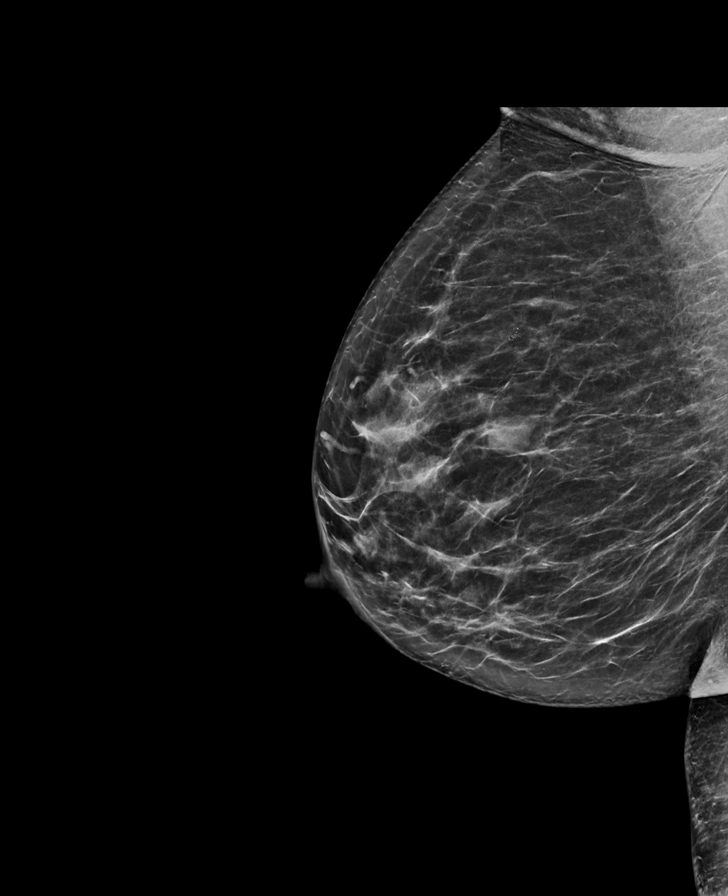

[R CC synth-2D]
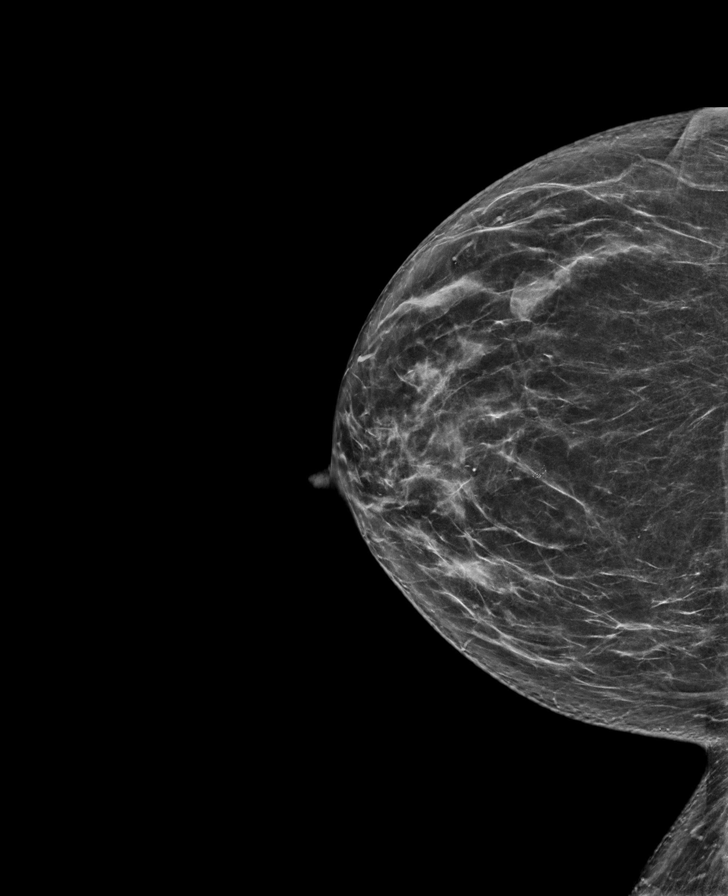

[L MLO synth-2D]
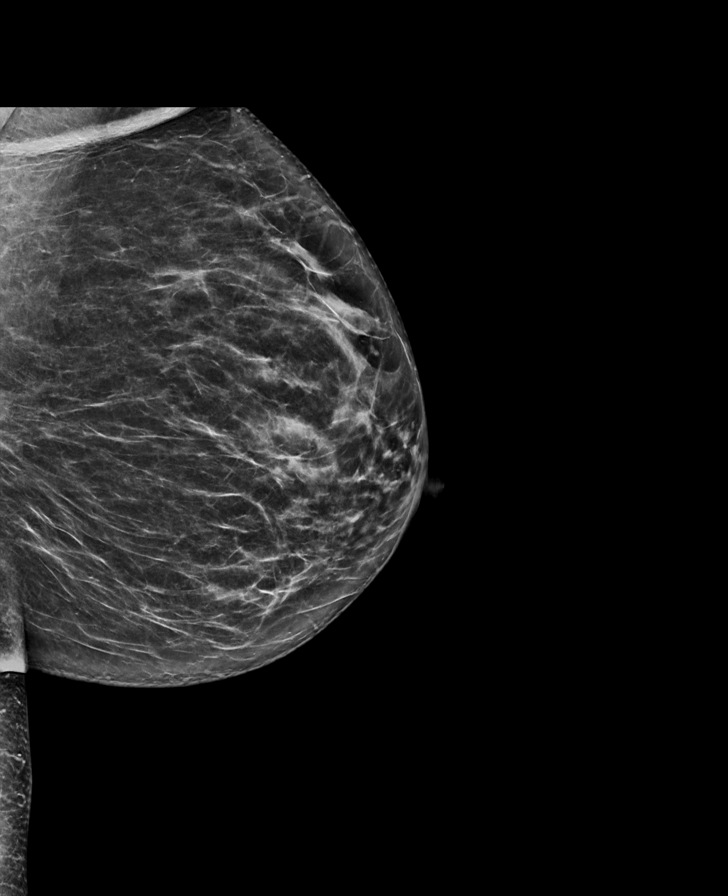

[L MLO tomo · tomo slice 36/71.0]
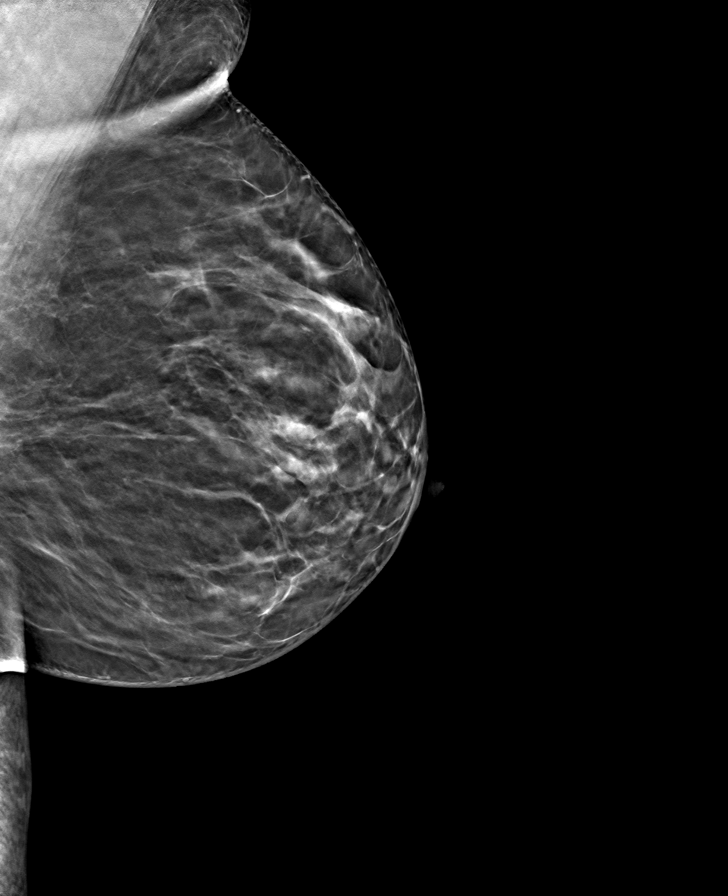

[R MLO tomo · tomo slice 37/72.0]
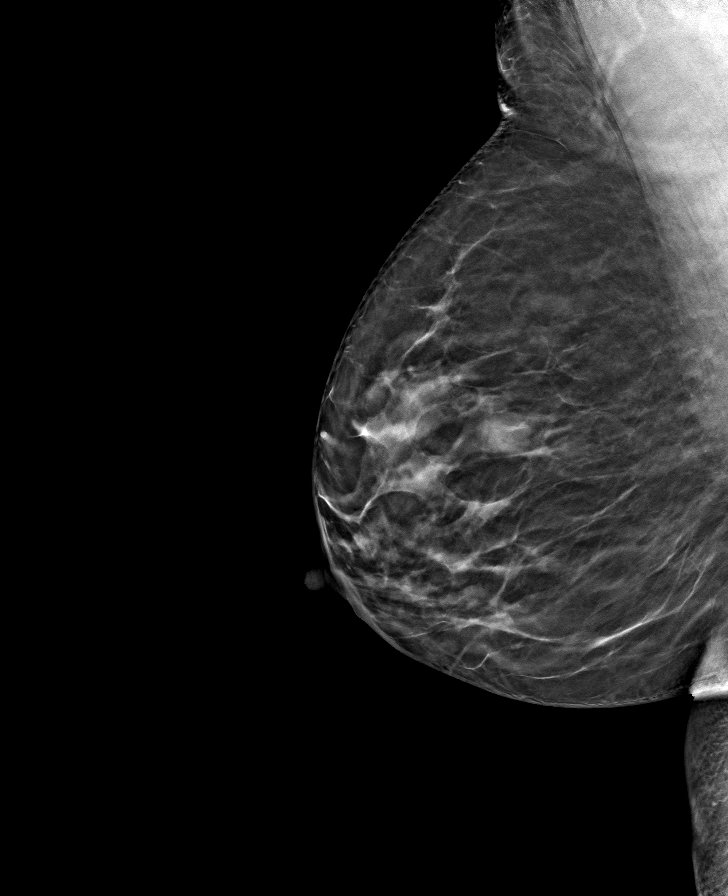

[R CC tomo · tomo slice 36/71.0]
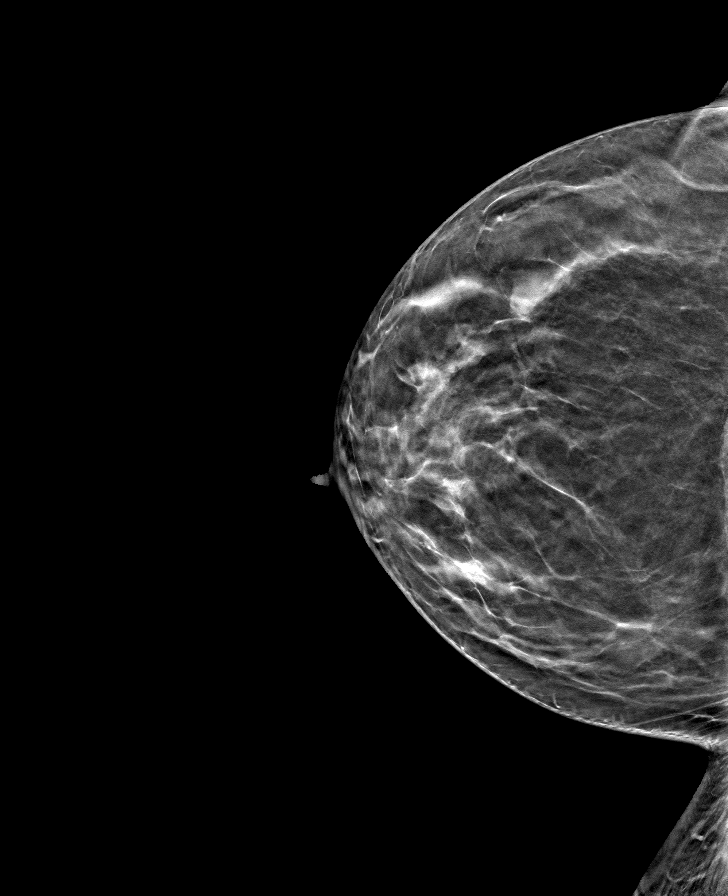

[L CC tomo · tomo slice 37/72.0]
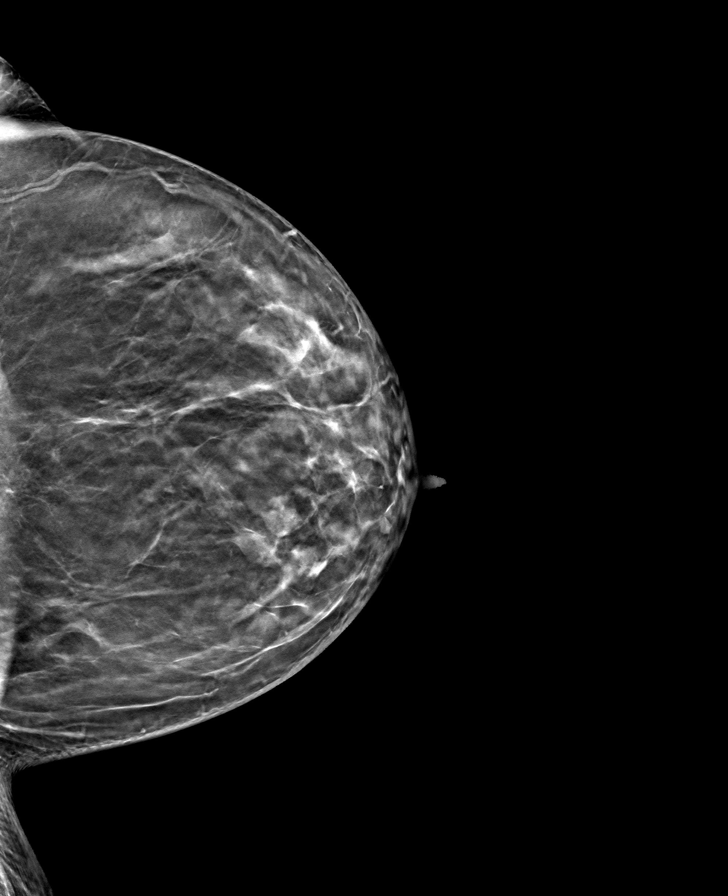

[8 of 24 positions shown; findings below may reference images not displayed]

ACR Breast Density Category b: There are scattered areas of
fibroglandular density.
FINDINGS: There are no findings suspicious for malignancy.
IMPRESSION: No mammographic evidence of malignancy. A result letter of this
screening mammogram will be mailed directly to the patient.

RECOMMENDATION:
Screening mammogram in one year. (Code:51-O-LD2)

BI-RADS CATEGORY  1: Negative.

## 2021-11-18 IMAGING — CT CT ABD-PELV W/ CM
2 of 5 series · 13 of 36 positions shown, 16 images · IV contrast (OMNIPAQUE)
Comparison: 11/27/2020

CLINICAL DATA: Non-small cell lung cancer restaging, ongoing
chemotherapy

EXAM:
CT CHEST, ABDOMEN, AND PELVIS WITH CONTRAST
TECHNIQUE: Multidetector CT imaging of the chest, abdomen and pelvis was
performed following the standard protocol during bolus
administration of intravenous contrast.
CONTRAST:  80mL OMNIPAQUE IOHEXOL 350 MG/ML SOLN, additional oral
enteric contrast

[Series 2: cap with · axial · 0.84mm/px · z∈[-534,-54]mm · 10 of 118 slices shown, 13 images]
[im 11/118  mediastinal]
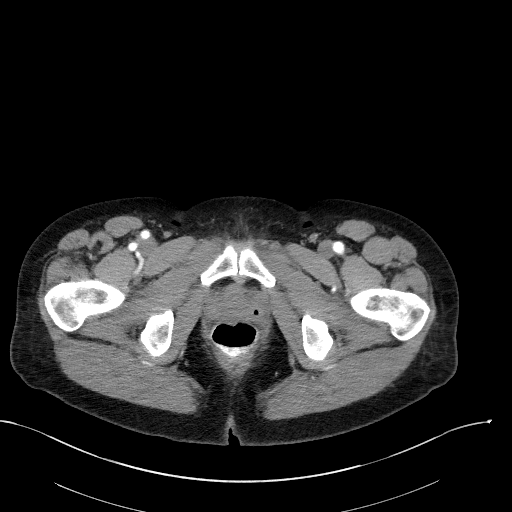
[im 11/118  lung]
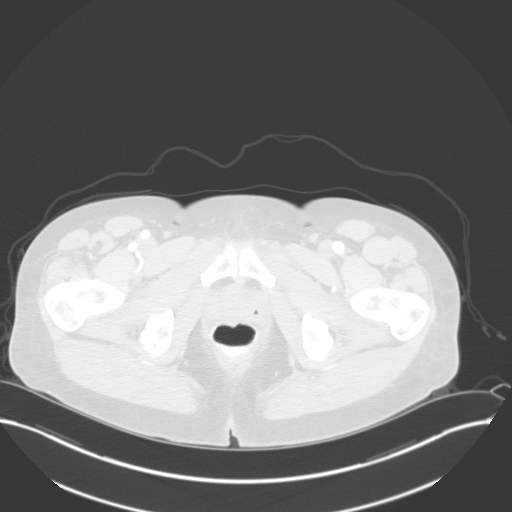
[im 22/118  lung]
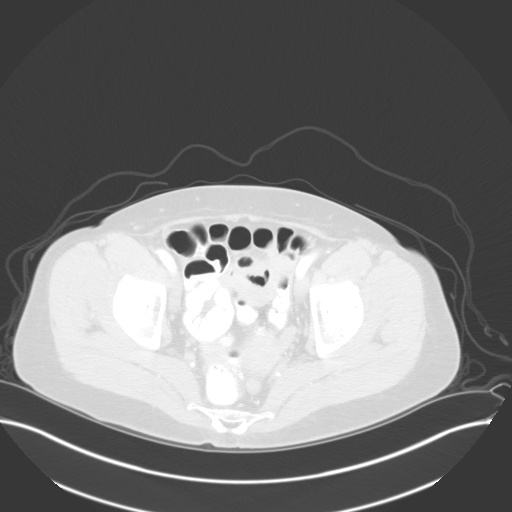
[im 32/118  lung]
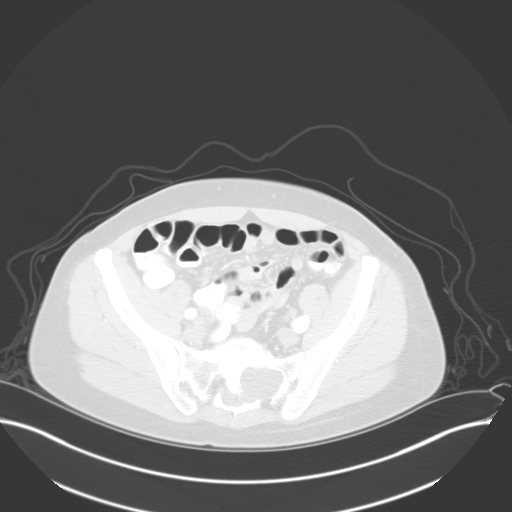
[im 43/118  lung]
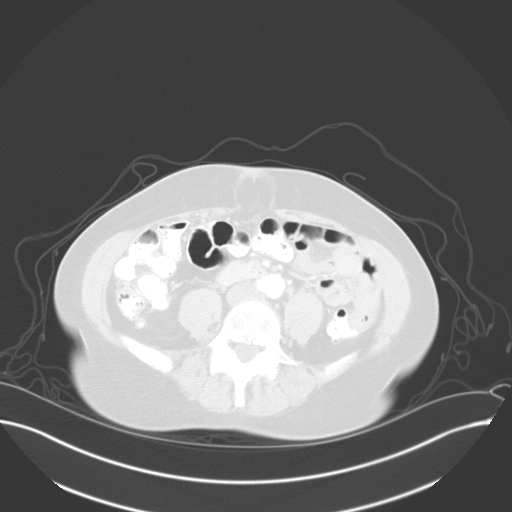
[im 54/118  mediastinal]
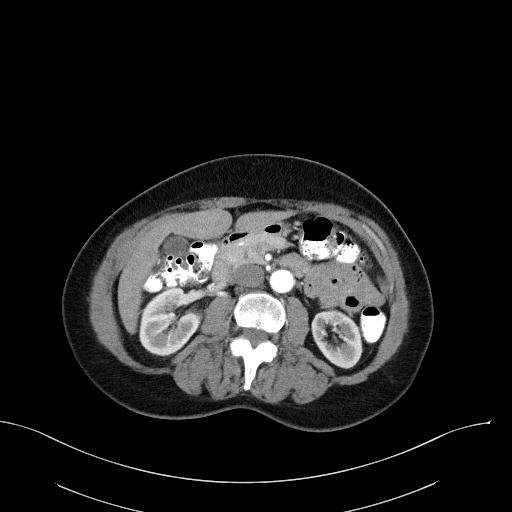
[im 54/118  lung]
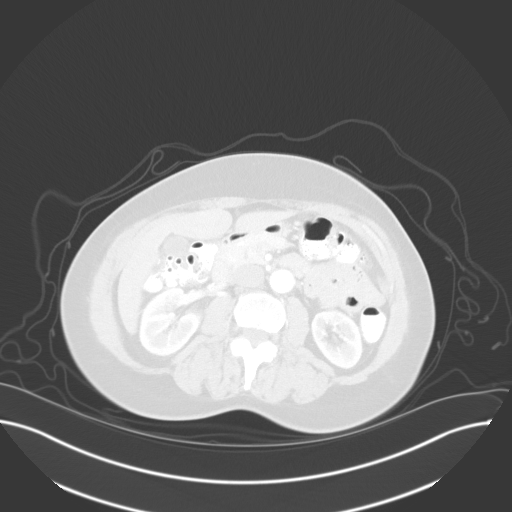
[im 64/118  lung]
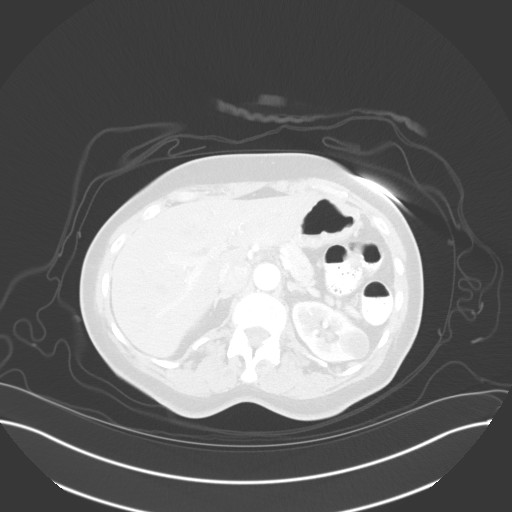
[im 75/118  lung]
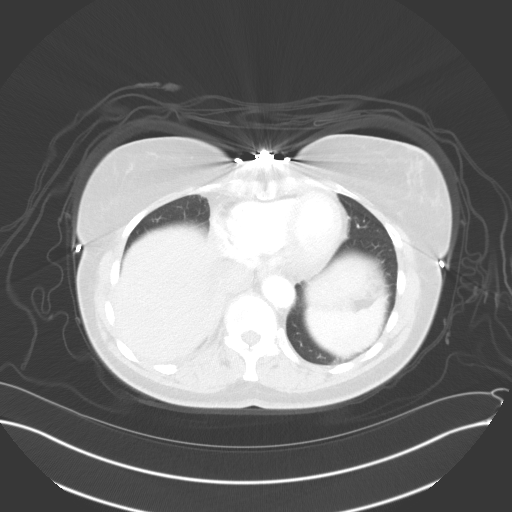
[im 86/118  lung]
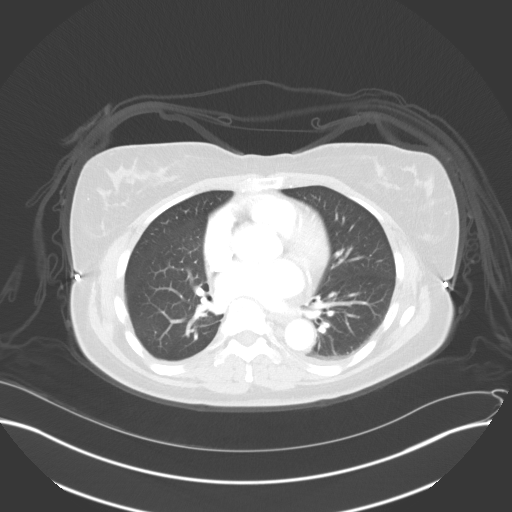
[im 96/118  mediastinal]
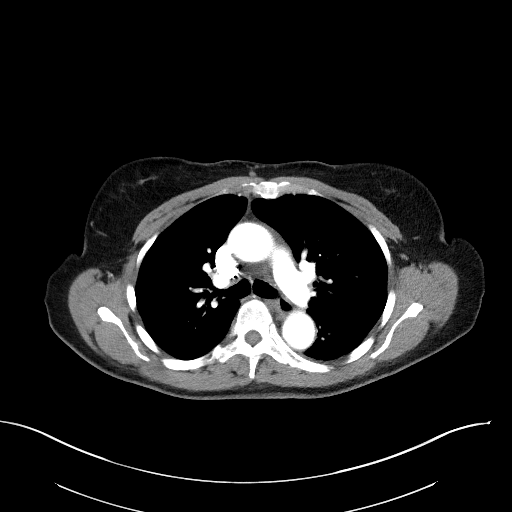
[im 96/118  lung]
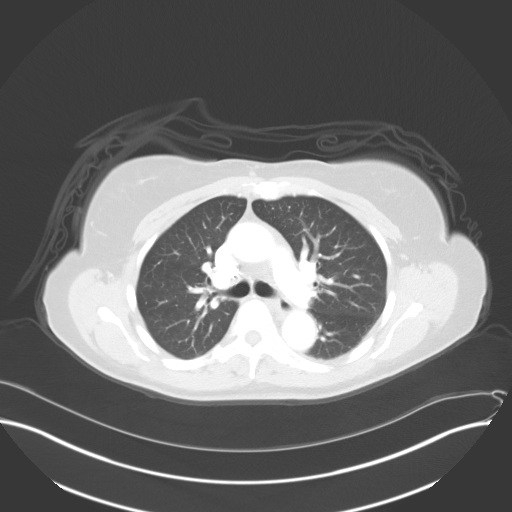
[im 107/118  lung]
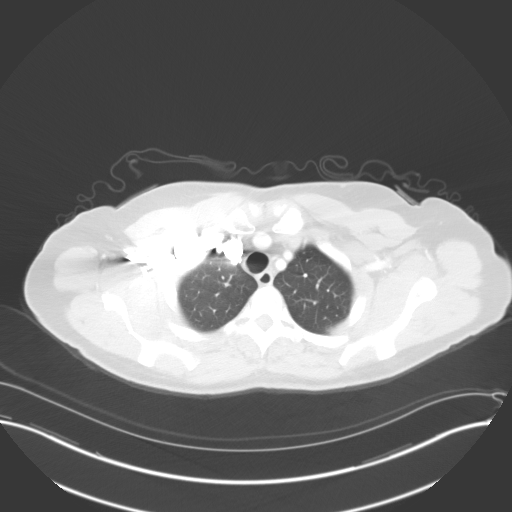

[Series 6: coronals · coronal · 0.74mm/px · 3 of 137 slices shown]
[im 28/137  lung]
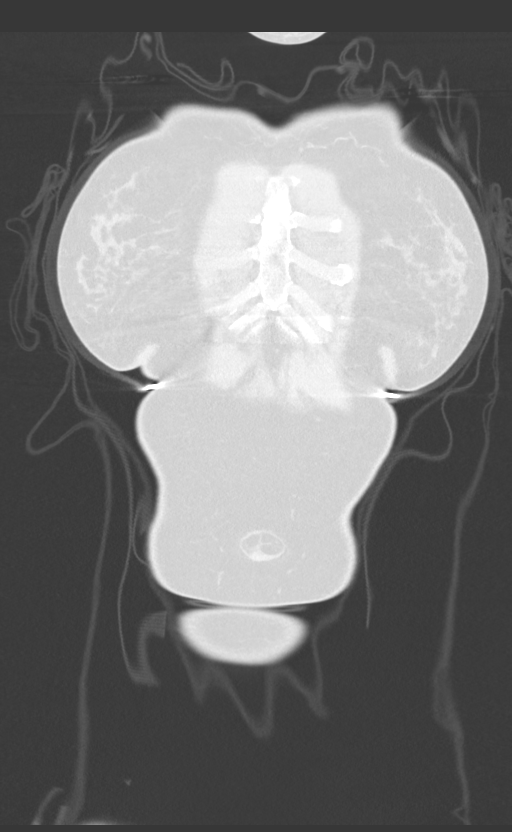
[im 55/137  lung]
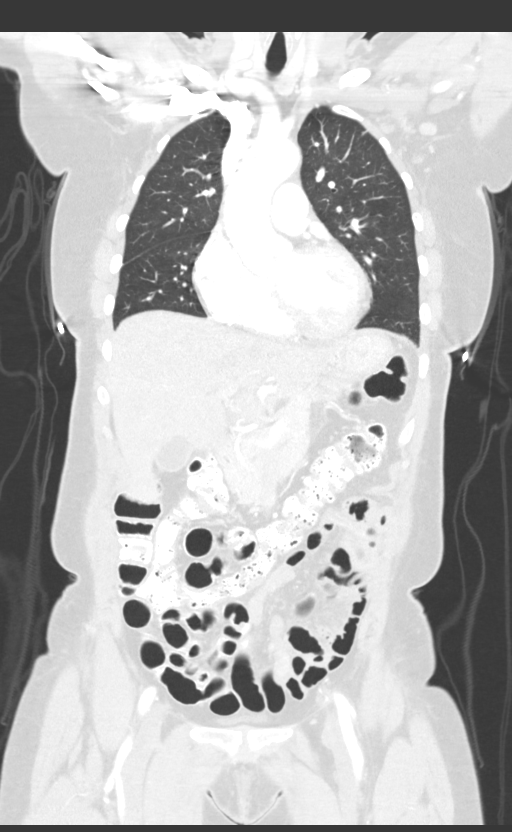
[im 82/137  lung]
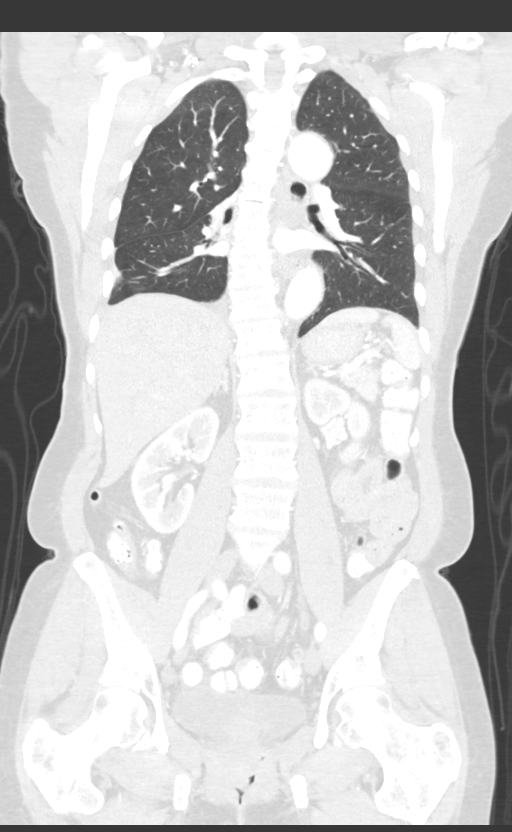

[13 of 36 positions shown; findings below may reference images not displayed]

FINDINGS: CT CHEST FINDINGS

Cardiovascular: Aortic atherosclerosis. Normal heart size. No
pericardial effusion.

Mediastinum/Nodes: No enlarged mediastinal, hilar, or axillary lymph
nodes. Thyroid gland, trachea, and esophagus demonstrate no
significant findings.

Lungs/Pleura: Status post right lower lobectomy. Unchanged focus of
pleural thickening and or rounded atelectasis about the peripheral
right middle lobe measuring 1.9 x 0.8 cm (series 5, image 90).
Unchanged, minimal, bandlike residua of previously noted left upper
lobe pulmonary nodules (series 5, image 41, 38). No pleural effusion
or pneumothorax.

Musculoskeletal: No chest wall mass or suspicious bone lesions
identified.

CT ABDOMEN PELVIS FINDINGS

Hepatobiliary: No solid liver abnormality is seen. No gallstones,
gallbladder wall thickening, or biliary dilatation.

Pancreas: Unremarkable. No pancreatic ductal dilatation or
surrounding inflammatory changes.

Spleen: Normal in size without significant abnormality.

Adrenals/Urinary Tract: Adrenal glands are unremarkable. Kidneys are
normal, without renal calculi, solid lesion, or hydronephrosis.
Bladder is unremarkable.

Stomach/Bowel: Stomach is within normal limits. Appendix appears
normal. No evidence of bowel wall thickening, distention, or
inflammatory changes. Descending and sigmoid diverticulosis.

Vascular/Lymphatic: No significant vascular findings are present. No
enlarged abdominal or pelvic lymph nodes.

Reproductive: Uterine fibroids.

Other: Fat containing umbilical hernia.  No abdominopelvic ascites.

Musculoskeletal: No acute or significant osseous findings. Large,
left-sided sacral Tarlov cyst (series 2, image 87).
IMPRESSION: 1. Unchanged post treatment appearance of the chest status post
right lower lobectomy.
2. Unchanged focus of pleural thickening and or rounded atelectasis
about the peripheral right middle lobe. Attention on follow-up.
3. Unchanged, minimal, bandlike residua of previously noted left
upper lobe pulmonary nodules. No new nodules.
4. No evidence of metastatic disease in the abdomen or pelvis.

Aortic Atherosclerosis ([id number]).

## 2021-12-01 ENCOUNTER — Other Ambulatory Visit: Payer: Self-pay | Admitting: Medical Oncology

## 2021-12-01 DIAGNOSIS — C3491 Malignant neoplasm of unspecified part of right bronchus or lung: Secondary | ICD-10-CM

## 2021-12-01 MED ORDER — OSIMERTINIB MESYLATE 80 MG PO TABS: 80.0000 mg | ORAL_TABLET | Freq: Every day | ORAL | 3 refills | Status: DC

## 2021-12-01 NOTE — Telephone Encounter (Signed)
Tagrisso refill requested. Faxed refill to [facility] and [facility].

## 2021-12-03 ENCOUNTER — Other Ambulatory Visit: Payer: Self-pay | Admitting: Physician Assistant

## 2021-12-03 DIAGNOSIS — E2839 Other primary ovarian failure: Secondary | ICD-10-CM

## 2021-12-08 ENCOUNTER — Other Ambulatory Visit: Payer: Self-pay | Admitting: *Deleted

## 2021-12-08 DIAGNOSIS — C3491 Malignant neoplasm of unspecified part of right bronchus or lung: Secondary | ICD-10-CM

## 2021-12-08 MED ORDER — OSIMERTINIB MESYLATE 80 MG PO TABS: 80.0000 mg | ORAL_TABLET | Freq: Every day | ORAL | 3 refills | Status: DC

## 2021-12-13 ENCOUNTER — Other Ambulatory Visit: Payer: Self-pay

## 2021-12-21 ENCOUNTER — Other Ambulatory Visit: Payer: Self-pay

## 2021-12-21 ENCOUNTER — Inpatient Hospital Stay: Payer: Medicare HMO | Attending: Internal Medicine

## 2021-12-21 ENCOUNTER — Inpatient Hospital Stay (HOSPITAL_BASED_OUTPATIENT_CLINIC_OR_DEPARTMENT_OTHER): Payer: Medicare HMO | Admitting: Internal Medicine

## 2021-12-21 VITALS — BP 167/83 | HR 64 | Temp 98.0°F | Resp 14 | Wt 148.7 lb

## 2021-12-21 DIAGNOSIS — C3491 Malignant neoplasm of unspecified part of right bronchus or lung: Secondary | ICD-10-CM

## 2021-12-21 DIAGNOSIS — R197 Diarrhea, unspecified: Secondary | ICD-10-CM | POA: Diagnosis not present

## 2021-12-21 DIAGNOSIS — C342 Malignant neoplasm of middle lobe, bronchus or lung: Secondary | ICD-10-CM | POA: Insufficient documentation

## 2021-12-21 DIAGNOSIS — C3412 Malignant neoplasm of upper lobe, left bronchus or lung: Secondary | ICD-10-CM | POA: Diagnosis present

## 2021-12-21 DIAGNOSIS — C3490 Malignant neoplasm of unspecified part of unspecified bronchus or lung: Secondary | ICD-10-CM

## 2021-12-21 DIAGNOSIS — D649 Anemia, unspecified: Secondary | ICD-10-CM | POA: Diagnosis not present

## 2021-12-21 LAB — CMP (CANCER CENTER ONLY)
ALT: 34 U/L (ref 0–44)
AST: 31 U/L (ref 15–41)
Albumin: 3.9 g/dL (ref 3.5–5.0)
Alkaline Phosphatase: 55 U/L (ref 38–126)
Anion gap: 3 — ABNORMAL LOW (ref 5–15)
BUN: 18 mg/dL (ref 8–23)
CO2: 30 mmol/L (ref 22–32)
Calcium: 8.7 mg/dL — ABNORMAL LOW (ref 8.9–10.3)
Chloride: 110 mmol/L (ref 98–111)
Creatinine: 1.1 mg/dL — ABNORMAL HIGH (ref 0.44–1.00)
GFR, Estimated: 54 mL/min — ABNORMAL LOW (ref >60)
Glucose, Bld: 89 mg/dL (ref 70–99)
Potassium: 4 mmol/L (ref 3.5–5.1)
Sodium: 143 mmol/L (ref 135–145)
Total Bilirubin: 0.7 mg/dL (ref 0.3–1.2)
Total Protein: 6.2 g/dL — ABNORMAL LOW (ref 6.5–8.1)

## 2021-12-21 LAB — CBC WITH DIFFERENTIAL (CANCER CENTER ONLY)
Abs Immature Granulocytes: 0.01 10*3/uL (ref 0.00–0.07)
Basophils Absolute: 0 10*3/uL (ref 0.0–0.1)
Basophils Relative: 1 %
Eosinophils Absolute: 0.2 10*3/uL (ref 0.0–0.5)
Eosinophils Relative: 5 %
HCT: 35.6 % — ABNORMAL LOW (ref 36.0–46.0)
Hemoglobin: 11.6 g/dL — ABNORMAL LOW (ref 12.0–15.0)
Immature Granulocytes: 0 %
Lymphocytes Relative: 36 %
Lymphs Abs: 1.4 10*3/uL (ref 0.7–4.0)
MCH: 30.1 pg (ref 26.0–34.0)
MCHC: 32.6 g/dL (ref 30.0–36.0)
MCV: 92.5 fL (ref 80.0–100.0)
Monocytes Absolute: 0.4 10*3/uL (ref 0.1–1.0)
Monocytes Relative: 9 %
Neutro Abs: 1.9 10*3/uL (ref 1.7–7.7)
Neutrophils Relative %: 49 %
Platelet Count: 146 10*3/uL — ABNORMAL LOW (ref 150–400)
RBC: 3.85 MIL/uL — ABNORMAL LOW (ref 3.87–5.11)
RDW: 14.6 % (ref 11.5–15.5)
WBC Count: 4 10*3/uL (ref 4.0–10.5)
nRBC: 0 % (ref 0.0–0.2)

## 2021-12-21 NOTE — Progress Notes (Signed)
[facility] [phone]) [phone]   Fax:(336) [phone]  OFFICE PROGRESS NOTE  [name], PA-C [address] [place] [address]  DIAGNOSIS: Recurrent non-small cell lung cancer initially diagnosed as stage Ib (T2 a, N0, M0) non-small cell lung cancer, adenocarcinoma in September 2019.  The patient had bilateral pulmonary nodules including 2 nodules in the left upper lobe and 1 nodule in the right middle lobe in December 2021. Biomarker Findings Microsatellite status - MS-Stable Tumor Mutational Burden - 4 Muts/Mb Genomic Findings For a complete list of the genes assayed, please refer to the Appendix. 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 7 Disease relevant genes with no reportable alterations: ALK, BRAF, ERBB2, KRAS, MET, RET, ROS1  PDL1 Expression: 1%.  PRIOR THERAPY: Status post right lower lobectomy with lymph node dissection on February 10, 2018 under the care of Dr. Servando Snare.  CURRENT THERAPY: Tagrisso 80 mg p.o. daily.  First dose started on September 12, 2020.  Status post 15 months of treatment.  INTERVAL HISTORY: Shelby Green 71 y.o. female returns to the clinic today for follow-up visit.  The patient is feeling fine today with no concerning complaints.  She walked 4 miles earlier today before coming to the visit.  She denied having any chest pain, shortness of breath, cough or hemoptysis.  She has no nausea, vomiting but has occasional diarrhea with no constipation or abdominal pain.  She has no recent weight loss or night sweats.  She continues to tolerate her treatment with Tagrisso fairly well.  She missed 10 days of her treatment because she ran out of the medication and she did not realize it in a timely manner.  She resumed her treatment 2 weeks ago.  The patient is here today for evaluation and repeat blood work.   MEDICAL HISTORY: Past Medical History:  Diagnosis Date   Allergy    Back  pain    Cancer ([place])    Hypertension    Hypertension    Lung cancer ([place]) 01/2018   Lung mass    Wears dentures    Wears glasses     ALLERGIES:  is allergic to metoprolol and penicillin g.  MEDICATIONS:  Current Outpatient Medications  Medication Sig Dispense Refill   doxazosin (CARDURA) 2 MG tablet Take 2 mg by mouth daily.     latanoprost (XALATAN) 0.005 % ophthalmic solution      osimertinib mesylate (TAGRISSO) 80 MG tablet Take 1 tablet (80 mg total) by mouth daily. Faxed to [facility] and [phone] earlier. 30 tablet 3   No current facility-administered medications for this visit.    SURGICAL HISTORY:  Past Surgical History:  Procedure Laterality Date   COLONOSCOPY W/ BIOPSIES AND POLYPECTOMY     MULTIPLE TOOTH EXTRACTIONS     VIDEO ASSISTED THORACOSCOPY (VATS)/WEDGE RESECTION Right 02/14/2018   Procedure: RIGHT  VIDEO ASSISTED THORACOSCOPY (VATS) WITH RIGHT LOWER LOBE (RLL) WEDGE RESECTION, COMPLETION RLL LOBECTOMY, LYMPH NODE DISSECTION, ON-Q PLACEMENT;  Surgeon: Grace Isaac, MD;  Location: [facility];  Service: Thoracic;  Laterality: Right;   VIDEO BRONCHOSCOPY N/A 02/14/2018   Procedure: VIDEO BRONCHOSCOPY;  Surgeon: Grace Isaac, MD;  Location: [facility];  Service: Thoracic;  Laterality: N/A;    REVIEW OF SYSTEMS:  A comprehensive review of systems was negative except for: Gastrointestinal: positive for diarrhea   PHYSICAL EXAMINATION: General appearance: alert, cooperative, and no distress Head: Normocephalic, without obvious abnormality, atraumatic Neck: no adenopathy,  no JVD, supple, symmetrical, trachea midline, and thyroid not enlarged, symmetric, no tenderness/mass/nodules Lymph nodes: Cervical, supraclavicular, and axillary nodes normal. Resp: clear to auscultation bilaterally Back: symmetric, no curvature. ROM normal. No CVA tenderness. Cardio: regular rate and rhythm, S1, S2 normal, no murmur, click, rub or gallop GI: soft, non-tender; bowel sounds normal;  no masses,  no organomegaly Extremities: extremities normal, atraumatic, no cyanosis or edema  ECOG PERFORMANCE STATUS: 1 - Symptomatic but completely ambulatory  Blood pressure (!) 167/83, pulse 64, temperature 98 F (36.7 C), temperature source Oral, resp. rate 14, weight 148 lb 11.2 oz (67.4 kg), SpO2 100 %.  LABORATORY DATA: Lab Results  Component Value Date   WBC 5.8 10/19/2021   HGB 12.4 10/19/2021   HCT 37.3 10/19/2021   MCV 91.6 10/19/2021   PLT 163 10/19/2021      Chemistry      Component Value Date/Time   NA 142 10/19/2021 1309   K 4.2 10/19/2021 1309   CL 107 10/19/2021 1309   CO2 31 10/19/2021 1309   BUN 26 (H) 10/19/2021 1309   CREATININE 1.31 (H) 10/19/2021 1309   CREATININE 0.83 08/06/2015 1616      Component Value Date/Time   CALCIUM 9.5 10/19/2021 1309   ALKPHOS 59 10/19/2021 1309   AST 21 10/19/2021 1309   ALT 35 10/19/2021 1309   BILITOT 0.8 10/19/2021 1309       RADIOGRAPHIC STUDIES: No results found.  ASSESSMENT AND PLAN: This is a very pleasant 71 years old African-American female with highly suspicious recurrent non-small cell lung cancer, initially diagnosed as a stage Ib (T2 a, N0, M0) non-small cell lung cancer, adenocarcinoma status post right lower lobectomy with lymph node dissection in September 2019 under the care of Dr. Servando Snare.  The patient had evidence for disease recurrence in December 2021 presenting with 2 pulmonary nodules in the left upper lobe as well as 1 nodule in the right middle lobe. 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  Her PD-L1 expression is 1%. The patient was found on previous imaging studies to have evidence for disease progression with enlarging pulmonary nodules. She started treatment with Tagrisso 80 mg p.o. daily on September 12, 2020.  She is status post 15 months of treatment. The patient has been tolerating this treatment well with no concerning  adverse effects except for occasional diarrhea. CBC today is unremarkable except for mild anemia and thrombocytopenia. I recommended for her to continue her treatment with Tagrisso with the same dose. I will see her back for follow-up visit in 2 months for evaluation and repeat blood work. The patient was advised to call immediately if she has any other concerning symptoms in the interval. The patient voices understanding of current disease status and treatment options and is in agreement with the current care plan.  All questions were answered. The patient knows to call the clinic with any problems, questions or concerns. We can certainly see the patient much sooner if necessary.  Disclaimer: This note was dictated with voice recognition software. Similar sounding words can inadvertently be transcribed and may not be corrected upon review.

## 2022-01-18 ENCOUNTER — Other Ambulatory Visit: Payer: Self-pay | Admitting: Physician Assistant

## 2022-01-18 DIAGNOSIS — Z1231 Encounter for screening mammogram for malignant neoplasm of breast: Secondary | ICD-10-CM

## 2022-02-23 ENCOUNTER — Inpatient Hospital Stay: Payer: Medicare HMO | Admitting: Internal Medicine

## 2022-02-23 ENCOUNTER — Inpatient Hospital Stay: Payer: Medicare HMO | Attending: Internal Medicine

## 2022-02-23 ENCOUNTER — Other Ambulatory Visit: Payer: Self-pay

## 2022-02-23 VITALS — BP 150/89 | HR 83 | Temp 98.1°F | Resp 16 | Wt 150.4 lb

## 2022-02-23 DIAGNOSIS — R918 Other nonspecific abnormal finding of lung field: Secondary | ICD-10-CM | POA: Insufficient documentation

## 2022-02-23 DIAGNOSIS — C342 Malignant neoplasm of middle lobe, bronchus or lung: Secondary | ICD-10-CM | POA: Insufficient documentation

## 2022-02-23 DIAGNOSIS — D696 Thrombocytopenia, unspecified: Secondary | ICD-10-CM | POA: Insufficient documentation

## 2022-02-23 DIAGNOSIS — C3412 Malignant neoplasm of upper lobe, left bronchus or lung: Secondary | ICD-10-CM | POA: Diagnosis present

## 2022-02-23 DIAGNOSIS — R110 Nausea: Secondary | ICD-10-CM | POA: Insufficient documentation

## 2022-02-23 DIAGNOSIS — R197 Diarrhea, unspecified: Secondary | ICD-10-CM | POA: Diagnosis not present

## 2022-02-23 DIAGNOSIS — C3490 Malignant neoplasm of unspecified part of unspecified bronchus or lung: Secondary | ICD-10-CM

## 2022-02-23 DIAGNOSIS — C3491 Malignant neoplasm of unspecified part of right bronchus or lung: Secondary | ICD-10-CM

## 2022-02-23 LAB — CMP (CANCER CENTER ONLY)
ALT: 29 U/L (ref 0–44)
AST: 31 U/L (ref 15–41)
Albumin: 3.8 g/dL (ref 3.5–5.0)
Alkaline Phosphatase: 54 U/L (ref 38–126)
Anion gap: 6 (ref 5–15)
BUN: 23 mg/dL (ref 8–23)
CO2: 26 mmol/L (ref 22–32)
Calcium: 9 mg/dL (ref 8.9–10.3)
Chloride: 109 mmol/L (ref 98–111)
Creatinine: 1.08 mg/dL — ABNORMAL HIGH (ref 0.44–1.00)
GFR, Estimated: 56 mL/min — ABNORMAL LOW (ref >60)
Glucose, Bld: 119 mg/dL — ABNORMAL HIGH (ref 70–99)
Potassium: 3.7 mmol/L (ref 3.5–5.1)
Sodium: 141 mmol/L (ref 135–145)
Total Bilirubin: 0.8 mg/dL (ref 0.3–1.2)
Total Protein: 6.4 g/dL — ABNORMAL LOW (ref 6.5–8.1)

## 2022-02-23 LAB — CBC WITH DIFFERENTIAL (CANCER CENTER ONLY)
Abs Immature Granulocytes: 0.01 10*3/uL (ref 0.00–0.07)
Basophils Absolute: 0 10*3/uL (ref 0.0–0.1)
Basophils Relative: 1 %
Eosinophils Absolute: 0.2 10*3/uL (ref 0.0–0.5)
Eosinophils Relative: 4 %
HCT: 35.3 % — ABNORMAL LOW (ref 36.0–46.0)
Hemoglobin: 12.1 g/dL (ref 12.0–15.0)
Immature Granulocytes: 0 %
Lymphocytes Relative: 42 %
Lymphs Abs: 1.8 10*3/uL (ref 0.7–4.0)
MCH: 31 pg (ref 26.0–34.0)
MCHC: 34.3 g/dL (ref 30.0–36.0)
MCV: 90.5 fL (ref 80.0–100.0)
Monocytes Absolute: 0.4 10*3/uL (ref 0.1–1.0)
Monocytes Relative: 8 %
Neutro Abs: 1.9 10*3/uL (ref 1.7–7.7)
Neutrophils Relative %: 45 %
Platelet Count: 146 10*3/uL — ABNORMAL LOW (ref 150–400)
RBC: 3.9 MIL/uL (ref 3.87–5.11)
RDW: 13.5 % (ref 11.5–15.5)
WBC Count: 4.2 10*3/uL (ref 4.0–10.5)
nRBC: 0 % (ref 0.0–0.2)

## 2022-02-23 NOTE — Progress Notes (Signed)
[facility] Telephone:(336) [phone]   Fax:(336) [phone]  OFFICE PROGRESS NOTE  [name], PA-C [address] [place] [address]  DIAGNOSIS: Recurrent non-small cell lung cancer initially diagnosed as stage Ib (T2 a, N0, M0) non-small cell lung cancer, adenocarcinoma in September 2019.  The patient had bilateral pulmonary nodules including 2 nodules in the left upper lobe and 1 nodule in the right middle lobe in December 2021. Biomarker Findings Microsatellite status - MS-Stable Tumor Mutational Burden - 4 Muts/Mb Genomic Findings For a complete list of the genes assayed, please refer to the Appendix. 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 7 Disease relevant genes with no reportable alterations: ALK, BRAF, ERBB2, KRAS, MET, RET, ROS1  PDL1 Expression: 1%.  PRIOR THERAPY: Status post right lower lobectomy with lymph node dissection on February 10, 2018 under the care of Dr. Servando Snare.  CURRENT THERAPY: Tagrisso 80 mg p.o. daily.  First dose started on September 12, 2020.  Status post 17 months of treatment.  INTERVAL HISTORY: Shelby Green 71 y.o. female returns to the clinic today for follow-up visit.  The patient is feeling fine today with no concerning complaints except for occasional nausea and 1 or 2 episodes of diarrhea.  She has no skin rash.  She denied having any chest pain, shortness of breath, cough or hemoptysis.  She has no abdominal pain or constipation.  She has no recent weight loss or night sweats.  She continues to tolerate her treatment with Tagrisso fairly well.  The patient is here today for evaluation and repeat blood work.   MEDICAL HISTORY: Past Medical History:  Diagnosis Date   Allergy    Back pain    Cancer ([place])    Hypertension    Hypertension    Lung cancer ([place]) 01/2018   Lung mass    Wears dentures    Wears glasses     ALLERGIES:  is allergic to metoprolol and  penicillin g.  MEDICATIONS:  Current Outpatient Medications  Medication Sig Dispense Refill   doxazosin (CARDURA) 4 MG tablet Take 4 mg by mouth daily.     latanoprost (XALATAN) 0.005 % ophthalmic solution      osimertinib mesylate (TAGRISSO) 80 MG tablet Take 1 tablet (80 mg total) by mouth daily. Faxed to [facility] and [phone] earlier. 30 tablet 3   No current facility-administered medications for this visit.    SURGICAL HISTORY:  Past Surgical History:  Procedure Laterality Date   COLONOSCOPY W/ BIOPSIES AND POLYPECTOMY     MULTIPLE TOOTH EXTRACTIONS     VIDEO ASSISTED THORACOSCOPY (VATS)/WEDGE RESECTION Right 02/14/2018   Procedure: RIGHT  VIDEO ASSISTED THORACOSCOPY (VATS) WITH RIGHT LOWER LOBE (RLL) WEDGE RESECTION, COMPLETION RLL LOBECTOMY, LYMPH NODE DISSECTION, ON-Q PLACEMENT;  Surgeon: Grace Isaac, MD;  Location: [facility];  Service: Thoracic;  Laterality: Right;   VIDEO BRONCHOSCOPY N/A 02/14/2018   Procedure: VIDEO BRONCHOSCOPY;  Surgeon: Grace Isaac, MD;  Location: [facility];  Service: Thoracic;  Laterality: N/A;    REVIEW OF SYSTEMS:  A comprehensive review of systems was negative except for: Gastrointestinal: positive for diarrhea and nausea   PHYSICAL EXAMINATION: General appearance: alert, cooperative, and no distress Head: Normocephalic, without obvious abnormality, atraumatic Neck: no adenopathy, no JVD, supple, symmetrical, trachea midline, and thyroid not enlarged, symmetric, no tenderness/mass/nodules Lymph nodes: Cervical, supraclavicular, and axillary nodes normal. Resp: clear to auscultation bilaterally Back: symmetric, no curvature. ROM normal. No CVA  tenderness. Cardio: regular rate and rhythm, S1, S2 normal, no murmur, click, rub or gallop GI: soft, non-tender; bowel sounds normal; no masses,  no organomegaly Extremities: extremities normal, atraumatic, no cyanosis or edema  ECOG PERFORMANCE STATUS: 1 - Symptomatic but completely ambulatory  Blood  pressure (!) 150/89, pulse 83, temperature 98.1 F (36.7 C), temperature source Oral, resp. rate 16, weight 150 lb 6 oz (68.2 kg), SpO2 96 %.  LABORATORY DATA: Lab Results  Component Value Date   WBC 4.2 02/23/2022   HGB 12.1 02/23/2022   HCT 35.3 (L) 02/23/2022   MCV 90.5 02/23/2022   PLT 146 (L) 02/23/2022      Chemistry      Component Value Date/Time   NA 143 12/21/2021 0904   K 4.0 12/21/2021 0904   CL 110 12/21/2021 0904   CO2 30 12/21/2021 0904   BUN 18 12/21/2021 0904   CREATININE 1.10 (H) 12/21/2021 0904   CREATININE 0.83 08/06/2015 1616      Component Value Date/Time   CALCIUM 8.7 (L) 12/21/2021 0904   ALKPHOS 55 12/21/2021 0904   AST 31 12/21/2021 0904   ALT 34 12/21/2021 0904   BILITOT 0.7 12/21/2021 0904       RADIOGRAPHIC STUDIES: No results found.  ASSESSMENT AND PLAN: This is a very pleasant 71 years old African-American female with highly suspicious recurrent non-small cell lung cancer, initially diagnosed as a stage Ib (T2 a, N0, M0) non-small cell lung cancer, adenocarcinoma status post right lower lobectomy with lymph node dissection in September 2019 under the care of Dr. Servando Snare.  The patient had evidence for disease recurrence in December 2021 presenting with 2 pulmonary nodules in the left upper lobe as well as 1 nodule in the right middle lobe. 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  Her PD-L1 expression is 1%. The patient was found on previous imaging studies to have evidence for disease progression with enlarging pulmonary nodules. She started treatment with Tagrisso 80 mg p.o. daily on September 12, 2020.  She is status post 17 months of treatment. She has been tolerating this treatment fairly well with no concerning adverse effects except for occasional diarrhea and mild nausea.  CBC today showed mild thrombocytopenia.  Comprehensive metabolic panel is still pending. I recommended for  her to continue her current treatment with Tagrisso with the same dose. I will see her back for follow-up visit in 2 months for evaluation with repeat CT scan of the chest for restaging of her disease. The patient was advised to call immediately if she has any other concerning symptoms in the interval. The patient voices understanding of current disease status and treatment options and is in agreement with the current care plan.  All questions were answered. The patient knows to call the clinic with any problems, questions or concerns. We can certainly see the patient much sooner if necessary.  Disclaimer: This note was dictated with voice recognition software. Similar sounding words can inadvertently be transcribed and may not be corrected upon review.

## 2022-02-28 ENCOUNTER — Ambulatory Visit
Admission: RE | Admit: 2022-02-28 | Discharge: 2022-02-28 | Disposition: A | Discharge status: Home / Self Care | Payer: Medicare HMO | Source: Ambulatory Visit | Attending: Physician Assistant | Admitting: Physician Assistant

## 2022-02-28 DIAGNOSIS — Z1231 Encounter for screening mammogram for malignant neoplasm of breast: Secondary | ICD-10-CM

## 2022-03-02 ENCOUNTER — Other Ambulatory Visit: Payer: Self-pay | Admitting: Physician Assistant

## 2022-03-02 DIAGNOSIS — R928 Other abnormal and inconclusive findings on diagnostic imaging of breast: Secondary | ICD-10-CM

## 2022-03-12 ENCOUNTER — Other Ambulatory Visit: Payer: Self-pay

## 2022-03-12 ENCOUNTER — Other Ambulatory Visit: Payer: Self-pay | Admitting: Physician Assistant

## 2022-03-12 ENCOUNTER — Ambulatory Visit
Admission: RE | Admit: 2022-03-12 | Discharge: 2022-03-12 | Disposition: A | Discharge status: Home / Self Care | Payer: Medicare HMO | Source: Ambulatory Visit | Attending: Physician Assistant | Admitting: Physician Assistant

## 2022-03-12 DIAGNOSIS — R599 Enlarged lymph nodes, unspecified: Secondary | ICD-10-CM

## 2022-03-12 DIAGNOSIS — R928 Other abnormal and inconclusive findings on diagnostic imaging of breast: Secondary | ICD-10-CM

## 2022-03-12 DIAGNOSIS — N6320 Unspecified lump in the left breast, unspecified quadrant: Secondary | ICD-10-CM

## 2022-03-23 ENCOUNTER — Ambulatory Visit
Admission: RE | Admit: 2022-03-23 | Discharge: 2022-03-23 | Disposition: A | Discharge status: Home / Self Care | Payer: Medicare HMO | Source: Ambulatory Visit | Attending: Physician Assistant | Admitting: Physician Assistant

## 2022-03-23 DIAGNOSIS — R599 Enlarged lymph nodes, unspecified: Secondary | ICD-10-CM

## 2022-03-23 DIAGNOSIS — N6320 Unspecified lump in the left breast, unspecified quadrant: Secondary | ICD-10-CM

## 2022-03-23 HISTORY — PX: BREAST BIOPSY: SHX20

## 2022-04-04 ENCOUNTER — Other Ambulatory Visit: Payer: Self-pay | Admitting: Medical Oncology

## 2022-04-04 DIAGNOSIS — C3491 Malignant neoplasm of unspecified part of right bronchus or lung: Secondary | ICD-10-CM

## 2022-04-04 MED ORDER — OSIMERTINIB MESYLATE 80 MG PO TABS: 80.0000 mg | ORAL_TABLET | Freq: Every day | ORAL | 3 refills | Status: DC

## 2022-04-05 ENCOUNTER — Telehealth: Payer: Self-pay | Admitting: *Deleted

## 2022-04-05 NOTE — Telephone Encounter (Signed)
Tagrisso prescription faxed to [facility] and [facility].

## 2022-04-18 ENCOUNTER — Telehealth: Payer: Self-pay | Admitting: Internal Medicine

## 2022-04-18 NOTE — Telephone Encounter (Signed)
Called patient regarding upcoming December appointments, left a voicemail.

## 2022-04-25 ENCOUNTER — Other Ambulatory Visit: Payer: Self-pay

## 2022-04-25 ENCOUNTER — Inpatient Hospital Stay: Payer: Medicare HMO | Attending: Internal Medicine

## 2022-04-25 DIAGNOSIS — C3412 Malignant neoplasm of upper lobe, left bronchus or lung: Secondary | ICD-10-CM | POA: Diagnosis present

## 2022-04-25 DIAGNOSIS — C3490 Malignant neoplasm of unspecified part of unspecified bronchus or lung: Secondary | ICD-10-CM

## 2022-04-25 DIAGNOSIS — C342 Malignant neoplasm of middle lobe, bronchus or lung: Secondary | ICD-10-CM | POA: Diagnosis present

## 2022-04-25 LAB — CBC WITH DIFFERENTIAL (CANCER CENTER ONLY)
Abs Immature Granulocytes: 0 10*3/uL (ref 0.00–0.07)
Basophils Absolute: 0 10*3/uL (ref 0.0–0.1)
Basophils Relative: 1 %
Eosinophils Absolute: 0.2 10*3/uL (ref 0.0–0.5)
Eosinophils Relative: 5 %
HCT: 36.6 % (ref 36.0–46.0)
Hemoglobin: 11.9 g/dL — ABNORMAL LOW (ref 12.0–15.0)
Immature Granulocytes: 0 %
Lymphocytes Relative: 38 %
Lymphs Abs: 1.3 10*3/uL (ref 0.7–4.0)
MCH: 30 pg (ref 26.0–34.0)
MCHC: 32.5 g/dL (ref 30.0–36.0)
MCV: 92.2 fL (ref 80.0–100.0)
Monocytes Absolute: 0.4 10*3/uL (ref 0.1–1.0)
Monocytes Relative: 11 %
Neutro Abs: 1.5 10*3/uL — ABNORMAL LOW (ref 1.7–7.7)
Neutrophils Relative %: 45 %
Platelet Count: 175 10*3/uL (ref 150–400)
RBC: 3.97 MIL/uL (ref 3.87–5.11)
RDW: 14.1 % (ref 11.5–15.5)
WBC Count: 3.4 10*3/uL — ABNORMAL LOW (ref 4.0–10.5)
nRBC: 0 % (ref 0.0–0.2)

## 2022-04-25 LAB — CMP (CANCER CENTER ONLY)
ALT: 25 U/L (ref 0–44)
AST: 23 U/L (ref 15–41)
Albumin: 4.1 g/dL (ref 3.5–5.0)
Alkaline Phosphatase: 53 U/L (ref 38–126)
Anion gap: 3 — ABNORMAL LOW (ref 5–15)
BUN: 16 mg/dL (ref 8–23)
CO2: 31 mmol/L (ref 22–32)
Calcium: 9.4 mg/dL (ref 8.9–10.3)
Chloride: 110 mmol/L (ref 98–111)
Creatinine: 1.19 mg/dL — ABNORMAL HIGH (ref 0.44–1.00)
GFR, Estimated: 49 mL/min — ABNORMAL LOW (ref >60)
Glucose, Bld: 90 mg/dL (ref 70–99)
Potassium: 4.3 mmol/L (ref 3.5–5.1)
Sodium: 144 mmol/L (ref 135–145)
Total Bilirubin: 0.7 mg/dL (ref 0.3–1.2)
Total Protein: 6.5 g/dL (ref 6.5–8.1)

## 2022-04-26 ENCOUNTER — Ambulatory Visit (HOSPITAL_COMMUNITY): Admission: RE | Admit: 2022-04-26 | Payer: Medicare HMO | Source: Ambulatory Visit

## 2022-04-27 ENCOUNTER — Other Ambulatory Visit: Payer: Self-pay

## 2022-04-27 ENCOUNTER — Inpatient Hospital Stay (HOSPITAL_BASED_OUTPATIENT_CLINIC_OR_DEPARTMENT_OTHER): Payer: Medicare HMO | Admitting: Internal Medicine

## 2022-04-27 ENCOUNTER — Ambulatory Visit (HOSPITAL_COMMUNITY)
Admission: RE | Admit: 2022-04-27 | Discharge: 2022-04-27 | Disposition: A | Discharge status: Home / Self Care | Payer: Medicare HMO | Source: Ambulatory Visit | Attending: Internal Medicine | Admitting: Internal Medicine

## 2022-04-27 VITALS — BP 162/98 | HR 86 | Temp 98.5°F | Resp 16 | Wt 147.8 lb

## 2022-04-27 DIAGNOSIS — C3491 Malignant neoplasm of unspecified part of right bronchus or lung: Secondary | ICD-10-CM | POA: Diagnosis not present

## 2022-04-27 DIAGNOSIS — C3490 Malignant neoplasm of unspecified part of unspecified bronchus or lung: Secondary | ICD-10-CM | POA: Diagnosis present

## 2022-04-27 DIAGNOSIS — C3412 Malignant neoplasm of upper lobe, left bronchus or lung: Secondary | ICD-10-CM | POA: Diagnosis not present

## 2022-04-27 MED ORDER — SODIUM CHLORIDE (PF) 0.9 % IJ SOLN
INTRAMUSCULAR | Status: AC
  Filled 2022-04-27: qty 50

## 2022-04-27 MED ORDER — IOHEXOL 300 MG/ML  SOLN
75.0000 mL | Freq: Once | INTRAMUSCULAR | Status: AC | PRN
  Administered 2022-04-27: 75 mL via INTRAVENOUS

## 2022-04-27 NOTE — Progress Notes (Signed)
[facility] Telephone:(336) [phone]   Fax:(336) [phone]  OFFICE PROGRESS NOTE  [name], PA-C [address] [place] [address]  DIAGNOSIS: Recurrent non-small cell lung cancer initially diagnosed as stage Ib (T2 a, N0, M0) non-small cell lung cancer, adenocarcinoma in September 2019.  The patient had bilateral pulmonary nodules including 2 nodules in the left upper lobe and 1 nodule in the right middle lobe in December 2021. Biomarker Findings Microsatellite status - MS-Stable Tumor Mutational Burden - 4 Muts/Mb Genomic Findings For a complete list of the genes assayed, please refer to the Appendix. 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 7 Disease relevant genes with no reportable alterations: ALK, BRAF, ERBB2, KRAS, MET, RET, ROS1  PDL1 Expression: 1%.  PRIOR THERAPY: Status post right lower lobectomy with lymph node dissection on February 10, 2018 under the care of Dr. Servando Snare.  CURRENT THERAPY: Tagrisso 80 mg p.o. daily.  First dose started on September 12, 2020.  Status post 19 months of treatment.  INTERVAL HISTORY: Shelby Green 71 y.o. female returns to the clinic today for for follow-up visit.  The patient is feeling fine today with no concerning complaints except for intermittent fatigue.  She has no chest pain, shortness of breath, cough or hemoptysis.  She has no nausea, vomiting, diarrhea or constipation.  She has no headache or visual changes.  She denied having any significant weight loss or night sweats.  She is here today for evaluation with repeat CT scan of the chest for restaging of her disease.  She has been tolerating her treatment with Tagrisso fairly well.  MEDICAL HISTORY: Past Medical History:  Diagnosis Date   Allergy    Back pain    Cancer ([facility])    Hypertension    Hypertension    Lung cancer ([facility]) 01/2018   Lung mass    Wears dentures    Wears glasses     ALLERGIES:  is  allergic to metoprolol and penicillin g.  MEDICATIONS:  Current Outpatient Medications  Medication Sig Dispense Refill   doxazosin (CARDURA) 4 MG tablet Take 4 mg by mouth daily.     latanoprost (XALATAN) 0.005 % ophthalmic solution      osimertinib mesylate (TAGRISSO) 80 MG tablet Take 1 tablet (80 mg total) by mouth daily. Faxed to [facility] and [phone] earlier. 30 tablet 3   No current facility-administered medications for this visit.   Facility-Administered Medications Ordered in Other Visits  Medication Dose Route Frequency Provider Last Rate Last Admin   sodium chloride (PF) 0.9 % injection             SURGICAL HISTORY:  Past Surgical History:  Procedure Laterality Date   COLONOSCOPY W/ BIOPSIES AND POLYPECTOMY     MULTIPLE TOOTH EXTRACTIONS     VIDEO ASSISTED THORACOSCOPY (VATS)/WEDGE RESECTION Right 02/14/2018   Procedure: RIGHT  VIDEO ASSISTED THORACOSCOPY (VATS) WITH RIGHT LOWER LOBE (RLL) WEDGE RESECTION, COMPLETION RLL LOBECTOMY, LYMPH NODE DISSECTION, ON-Q PLACEMENT;  Surgeon: Grace Isaac, MD;  Location: [facility];  Service: Thoracic;  Laterality: Right;   VIDEO BRONCHOSCOPY N/A 02/14/2018   Procedure: VIDEO BRONCHOSCOPY;  Surgeon: Grace Isaac, MD;  Location: [facility];  Service: Thoracic;  Laterality: N/A;    REVIEW OF SYSTEMS:  Constitutional: positive for fatigue Eyes: negative Ears, nose, mouth, throat, and face: negative Respiratory: negative Cardiovascular: negative Gastrointestinal: negative Genitourinary:negative Integument/breast: negative Hematologic/lymphatic: negative Musculoskeletal:negative Neurological: negative Behavioral/Psych: negative Endocrine: negative  Allergic/Immunologic: negative   PHYSICAL EXAMINATION: General appearance: alert, cooperative, fatigued, and no distress Head: Normocephalic, without obvious abnormality, atraumatic Neck: no adenopathy, no JVD, supple, symmetrical, trachea midline, and thyroid not enlarged, symmetric,  no tenderness/mass/nodules Lymph nodes: Cervical, supraclavicular, and axillary nodes normal. Resp: clear to auscultation bilaterally Back: symmetric, no curvature. ROM normal. No CVA tenderness. Cardio: regular rate and rhythm, S1, S2 normal, no murmur, click, rub or gallop GI: soft, non-tender; bowel sounds normal; no masses,  no organomegaly Extremities: extremities normal, atraumatic, no cyanosis or edema Neurologic: Alert and oriented X 3, normal strength and tone. Normal symmetric reflexes. Normal coordination and gait  ECOG PERFORMANCE STATUS: 1 - Symptomatic but completely ambulatory  Blood pressure (!) 162/98, pulse 86, temperature 98.5 F (36.9 C), temperature source Oral, resp. rate 16, weight 147 lb 12.8 oz (67 kg), SpO2 100 %.  LABORATORY DATA: Lab Results  Component Value Date   WBC 3.4 (L) 04/25/2022   HGB 11.9 (L) 04/25/2022   HCT 36.6 04/25/2022   MCV 92.2 04/25/2022   PLT 175 04/25/2022      Chemistry      Component Value Date/Time   NA 144 04/25/2022 0957   K 4.3 04/25/2022 0957   CL 110 04/25/2022 0957   CO2 31 04/25/2022 0957   BUN 16 04/25/2022 0957   CREATININE 1.19 (H) 04/25/2022 0957   CREATININE 0.83 08/06/2015 1616      Component Value Date/Time   CALCIUM 9.4 04/25/2022 0957   ALKPHOS 53 04/25/2022 0957   AST 23 04/25/2022 0957   ALT 25 04/25/2022 0957   BILITOT 0.7 04/25/2022 0957       RADIOGRAPHIC STUDIES: CT Chest W Contrast  Result Date: 04/27/2022 CLINICAL DATA:  Non-small cell lung cancer restaging * Tracking Code: BO * EXAM: CT CHEST WITH CONTRAST TECHNIQUE: Multidetector CT imaging of the chest was performed during intravenous contrast administration. RADIATION DOSE REDUCTION: This exam was performed according to the departmental dose-optimization program which includes automated exposure control, adjustment of the mA and/or kV according to patient size and/or use of iterative reconstruction technique. CONTRAST:  75mL OMNIPAQUE  IOHEXOL 300 MG/ML  SOLN COMPARISON:  10/19/2021 FINDINGS: Cardiovascular: Aortic atherosclerosis. Normal heart size. No pericardial effusion. Mediastinum/Nodes: No enlarged mediastinal, hilar, or axillary lymph nodes. Thyroid gland, trachea, and esophagus demonstrate no significant findings. Lungs/Pleura: Unchanged postoperative/post treatment appearance of the right chest status post right lower lobectomy. Unchanged chronic subpleural consolidation/pleural thickening of the peripheral lateral segment right middle lobe measuring 2.3 x 1.0 cm (series 5, image 90). Unchanged small benign 0.3 cm nodule of the dependent right middle lobe (series 5, image 85). Unchanged trace bilateral pleural effusions. Upper Abdomen: No acute abnormality. Simple benign left renal cortical cyst, for which no further follow-up or characterization is required. Musculoskeletal: No chest wall abnormality. No acute osseous findings. IMPRESSION: 1. Unchanged postoperative/post treatment appearance of the right chest status post right lower lobectomy. 2. Unchanged chronic subpleural consolidation/pleural thickening of the peripheral lateral segment right middle lobe. 3. No evidence of recurrent or metastatic disease in the chest. Aortic Atherosclerosis (ICD10-I70.0). Electronically Signed   By: Alex D Bibbey M.D.   On: 04/27/2022 12:49    ASSESSMENT AND PLAN: This is a very pleasant 70 years old African-American female with highly suspicious recurrent non-small cell lung cancer, initially diagnosed as a stage Ib (T2 a, N0, M0) non-small cell lung cancer, adenocarcinoma status post right lower lobectomy with lymph node dissection in September 2019 under the care of Dr. Gerhardt.    The patient had evidence for disease recurrence in December 2021 presenting with 2 pulmonary nodules in the left upper lobe as well as 1 nodule in the right middle lobe. She had molecular studies by foundation 1 that showed positive EGFR mutation with deletion in  exon 19.  Her PD-L1 expression is 1%. The patient was found on previous imaging studies to have evidence for disease progression with enlarging pulmonary nodules. She started treatment with Tagrisso 80 mg p.o. daily on September 12, 2020.  She is status post 19 months of treatment. The patient is tolerating her current treatment with Tagrisso fairly well with no concerning adverse effects.  She has no significant diarrhea or skin rash. She had repeat CT scan of the chest performed recently.  I personally and independently reviewed the scan and discussed the result with the patient today. Her scan showed no concerning findings for disease progression. I recommended for her to continue on observation with repeat blood work in 2 months. The patient was advised to call immediately if she has any other concerning symptoms in the interval. The patient voices understanding of current disease status and treatment options and is in agreement with the current care plan.  All questions were answered. The patient knows to call the clinic with any problems, questions or concerns. We can certainly see the patient much sooner if necessary.  Disclaimer: This note was dictated with voice recognition software. Similar sounding words can inadvertently be transcribed and may not be corrected upon review.       

## 2022-05-17 ENCOUNTER — Other Ambulatory Visit: Payer: Medicare HMO

## 2022-07-08 IMAGING — CT CT CHEST W/ CM
2 of 4 series · 14 of 36 positions shown, 17 images · non-contrast
Comparison: Most recent CT chest 05/26/2021. 04/09/2020 PET-CT.

CLINICAL DATA: Primary Cancer Type: Lung
TECHNIQUE: Multidetector CT imaging of the chest was performed during
intravenous contrast administration.

[Series 3: axial st · axial · 0.77mm/px · z∈[-146,+108]mm · 11 of 151 slices shown, 14 images]
[im 12/151  mediastinal]
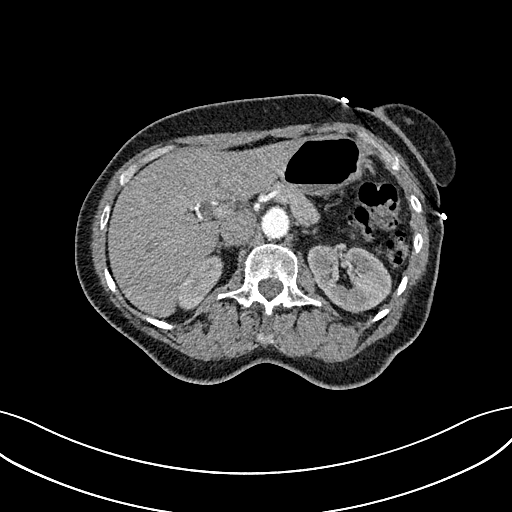
[im 12/151  lung]
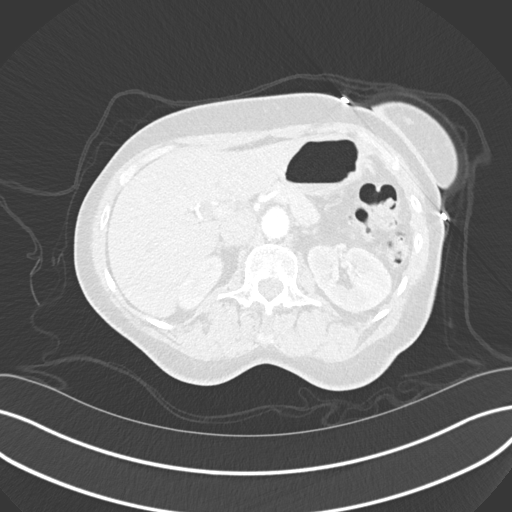
[im 24/151  lung]
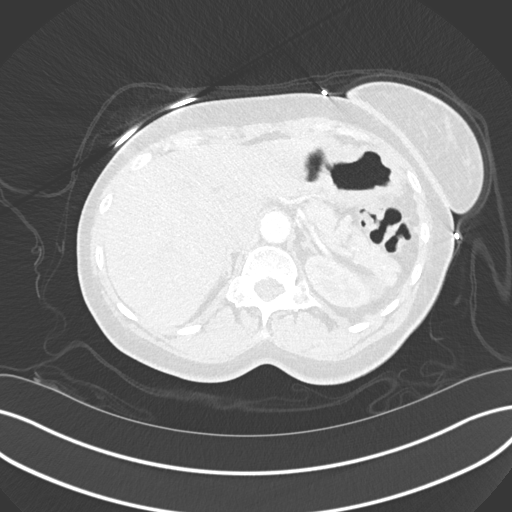
[im 35/151  lung]
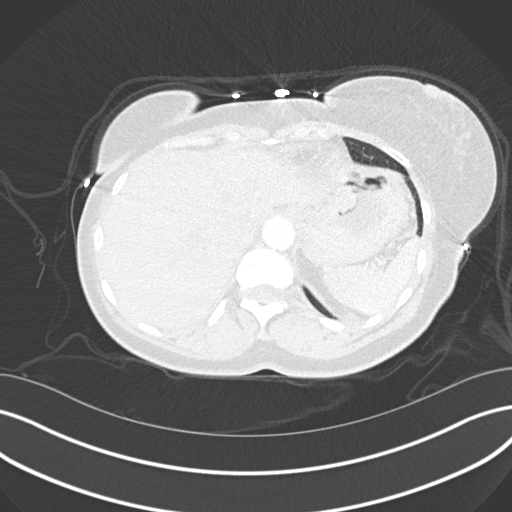
[im 47/151  lung]
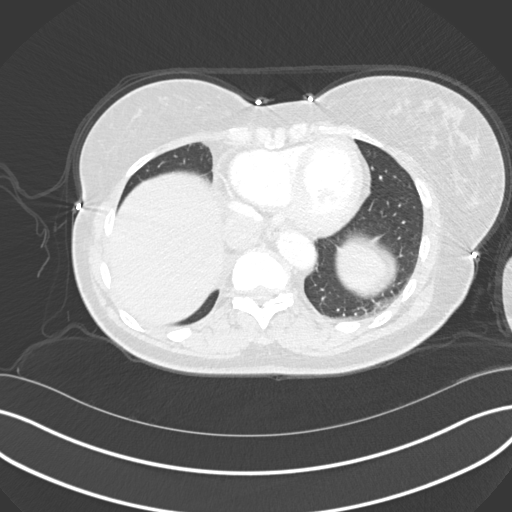
[im 58/151  mediastinal]
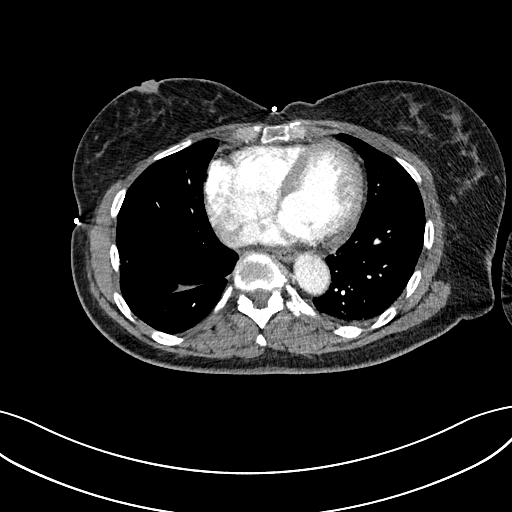
[im 58/151  lung]
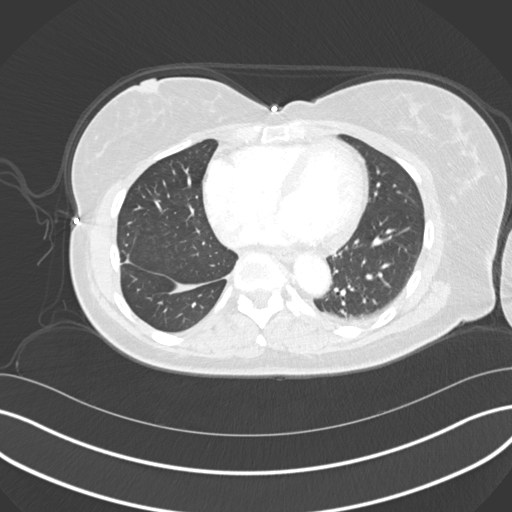
[im 81/151  lung]
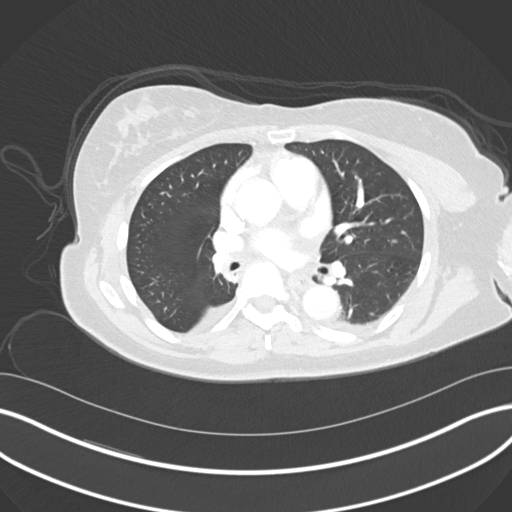
[im 93/151  lung]
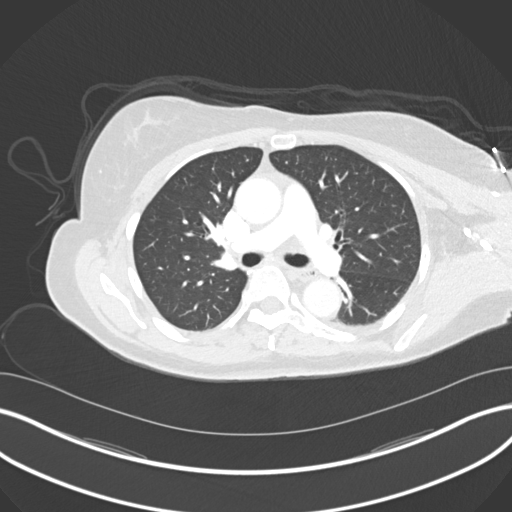
[im 104/151  lung]
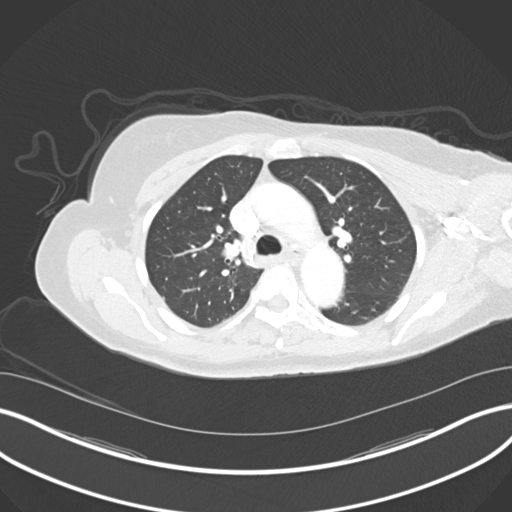
[im 116/151  mediastinal]
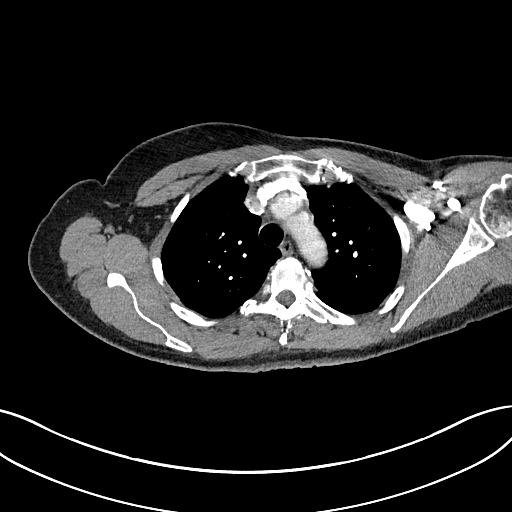
[im 116/151  lung]
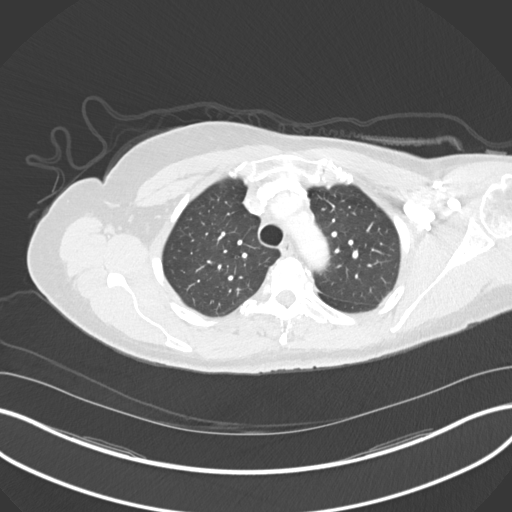
[im 127/151  lung]
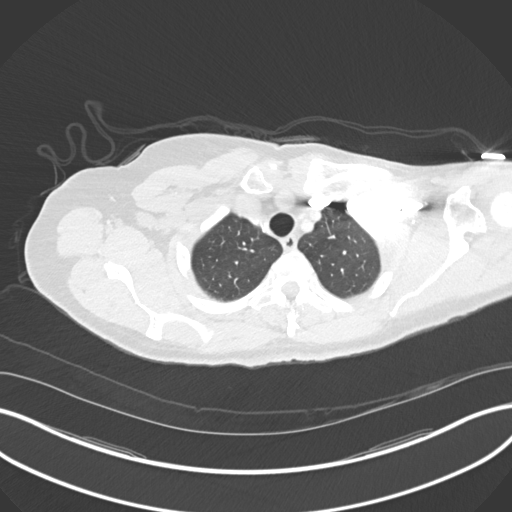
[im 139/151  lung]
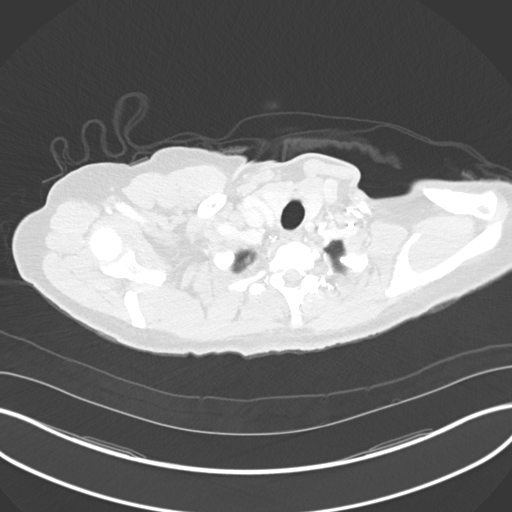

[Series 7: coronal · coronal · 0.62mm/px · 3 of 134 slices shown]
[im 27/134  lung]
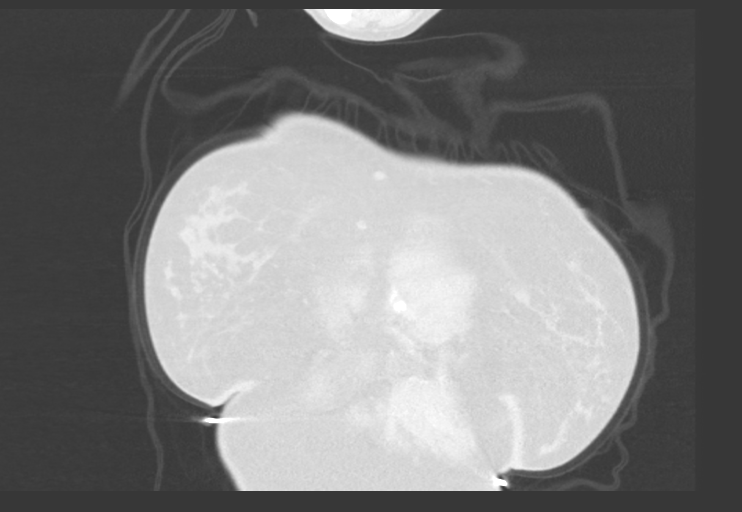
[im 54/134  lung]
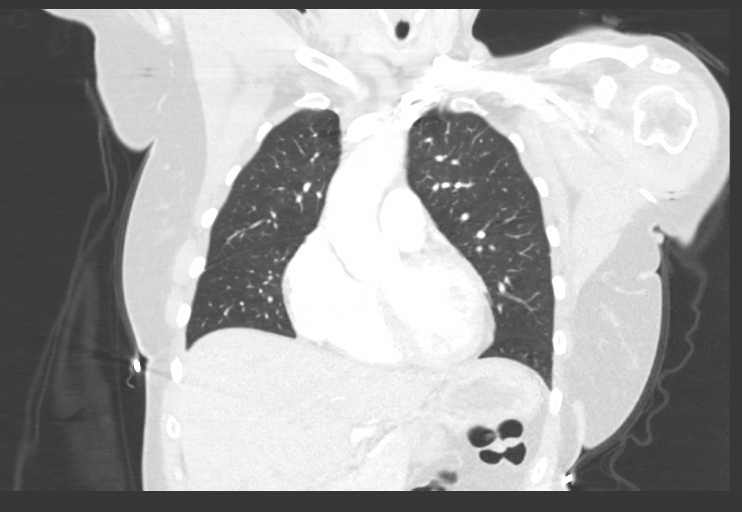
[im 80/134  lung]
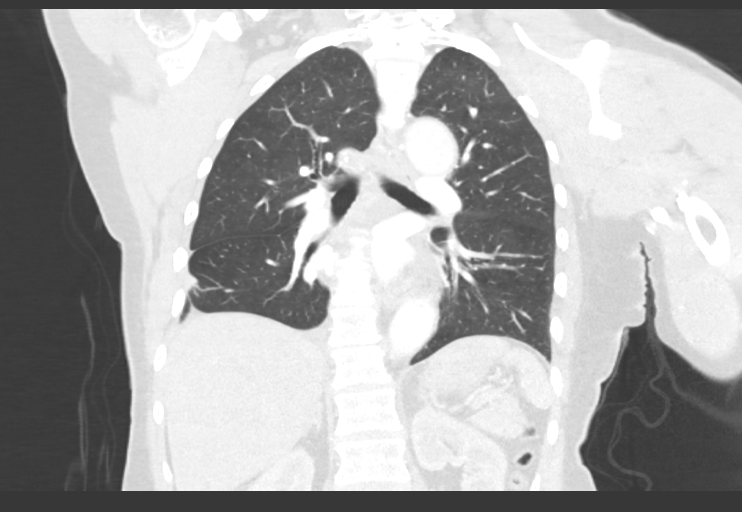

[14 of 36 positions shown; findings below may reference images not displayed]

Imaging Indication: Assess response to therapy

Interval therapy since last imaging? Yes

Initial Cancer Diagnosis

Date: 02/14/2018; Established by: Biopsy-proven

Detailed Pathology: Stage Ib non-small cell lung cancer,
adenocarcinoma.

Primary Tumor location:  Right lower lobe.

Recurrence? Yes; Date(s) of recurrence: 04/09/2020; Established by:
Imaging only

Surgeries: Right lower lobectomy 02/14/2018.

Chemotherapy: Yes; Ongoing? Yes; Tagrisso daily

Immunotherapy? No

Radiation therapy? No

* Tracking Code: BO *

EXAM:
CT CHEST WITH CONTRAST
RADIATION DOSE REDUCTION: This exam was performed according to the
departmental dose-optimization program which includes automated
exposure control, adjustment of the mA and/or kV according to
patient size and/or use of iterative reconstruction technique.

CONTRAST:  80mL OMNIPAQUE IOHEXOL 300 MG/ML  SOLN
FINDINGS: Cardiovascular: Atherosclerotic calcification of the aorta. Heart is
at the upper limits of normal in size. No pericardial effusion.

Mediastinum/Nodes: No pathologically enlarged mediastinal, hilar or
axillary lymph nodes. Esophagus is grossly unremarkable.

Lungs/Pleura: Right lower lobectomy. Associated pleuroparenchymal
scarring. 3 mm posterior right middle lobe nodule (6/83), unchanged.
Previously seen subpleural 8 mm left lower lobe nodule is no longer
visualized. Trace loculated right pleural fluid. Trace left pleural
effusion. Airway is unremarkable.

Upper Abdomen: Subcentimeter low-attenuation lesion in the
peripheral right hepatic lobe, too small to characterize. Visualized
portions of the liver, gallbladder, adrenal glands and right kidney
are otherwise unremarkable. Fluid density lesion in the upper pole
left kidney measures 2.5 cm, compatible with a cyst. No follow-up
necessary. Visualized portions of the spleen, pancreas, stomach and
bowel are grossly unremarkable.

Musculoskeletal: Degenerative changes in the spine. No worrisome
lytic or sclerotic lesions.
IMPRESSION: 1. Right lower lobectomy. No evidence of recurrent or metastatic
disease.
2. Trace bilateral pleural fluid.
3.  Aortic atherosclerosis ([id number]).

## 2022-07-28 ENCOUNTER — Other Ambulatory Visit: Payer: Self-pay | Admitting: Medical Oncology

## 2022-07-28 DIAGNOSIS — C3491 Malignant neoplasm of unspecified part of right bronchus or lung: Secondary | ICD-10-CM

## 2022-07-28 MED ORDER — OSIMERTINIB MESYLATE 80 MG PO TABS: 80.0000 mg | ORAL_TABLET | Freq: Every day | ORAL | 3 refills | Status: DC

## 2022-08-11 ENCOUNTER — Other Ambulatory Visit: Payer: Self-pay | Admitting: Medical Oncology

## 2022-08-11 ENCOUNTER — Other Ambulatory Visit (HOSPITAL_COMMUNITY): Payer: Self-pay

## 2022-08-11 ENCOUNTER — Telehealth: Payer: Self-pay | Admitting: Pharmacy Technician

## 2022-08-11 DIAGNOSIS — C3491 Malignant neoplasm of unspecified part of right bronchus or lung: Secondary | ICD-10-CM

## 2022-08-11 MED ORDER — OSIMERTINIB MESYLATE 80 MG PO TABS: 80.0000 mg | ORAL_TABLET | Freq: Every day | ORAL | 3 refills | Status: DC

## 2022-08-11 NOTE — Telephone Encounter (Signed)
Oral Oncology Patient Advocate Encounter   Received notification that patient is due for re-enrollment for assistance for Tagrisso through [employer].   Re-enrollment process has been initiated and will be submitted upon completion of necessary documents.  [employer] phone number [phone]  Reached out and spoke with patient regarding PAP paperwork, explained that I would send it to their preferred email via Docu[employer].   Confirmed email address:  [email or web address]    Patient expressed understanding and consent.  Will follow up once paperwork has been signed and returned.   Lady Deutscher, CPhT-Adv Oncology Pharmacy Patient [place] Direct Number: [phone]  Fax: [phone]

## 2022-08-11 NOTE — Telephone Encounter (Signed)
Oral Oncology Patient Advocate Encounter  Prior Authorization for [name] has been approved.    PA# [id number] Effective dates: 08/11/22 through 02/07/23  Patients co-pay is $3,328.    Lady Deutscher, CPhT-Adv Oncology Pharmacy Patient [place] Direct Number: [phone]  Fax: [phone]

## 2022-08-15 NOTE — Telephone Encounter (Signed)
Oral Oncology Patient Advocate Encounter   Submitted application for assistance for Tagrisso to [employer].   Application submitted via e-fax to [phone]   [employer] phone number [phone].   I will continue to check the status until final determination.   Lady Deutscher, CPhT-Adv Oncology Pharmacy Patient [place] Direct Number: [phone]  Fax: [phone]

## 2022-08-16 NOTE — Telephone Encounter (Signed)
Oral Oncology Patient Advocate Encounter   Received notification that the application for assistance for Tagrisso through [employer] has been approved.   [employer] phone number [phone].   Effective dates: 08/16/22 through 05/23/23  I have spoken to the patient.  Shelby Green, CPhT-Adv Oncology Pharmacy Patient [name] Direct Number: [phone]  Fax: [phone]

## 2022-08-25 ENCOUNTER — Ambulatory Visit
Admission: RE | Admit: 2022-08-25 | Discharge: 2022-08-25 | Disposition: A | Discharge status: Home / Self Care | Payer: Medicare HMO | Source: Ambulatory Visit | Attending: Physician Assistant | Admitting: Physician Assistant

## 2022-08-25 DIAGNOSIS — E2839 Other primary ovarian failure: Secondary | ICD-10-CM

## 2022-10-21 ENCOUNTER — Other Ambulatory Visit: Payer: Self-pay

## 2022-10-27 NOTE — Progress Notes (Signed)
This nurse received Critical Illness Forms for this patient.  They have been completed and signed by the provider.  Faxed to [employer] and [employer] Fax# [phone].  No further concerns at this time.

## 2022-11-14 ENCOUNTER — Other Ambulatory Visit: Payer: Self-pay | Admitting: *Deleted

## 2022-11-14 DIAGNOSIS — C3491 Malignant neoplasm of unspecified part of right bronchus or lung: Secondary | ICD-10-CM

## 2022-11-16 ENCOUNTER — Inpatient Hospital Stay: Payer: Medicare HMO | Admitting: Internal Medicine

## 2022-11-16 ENCOUNTER — Other Ambulatory Visit: Payer: Self-pay

## 2022-11-16 ENCOUNTER — Inpatient Hospital Stay: Payer: Medicare HMO | Attending: Internal Medicine

## 2022-11-16 VITALS — BP 155/84 | HR 62 | Temp 97.9°F | Resp 17 | Ht 64.0 in | Wt 142.8 lb

## 2022-11-16 DIAGNOSIS — C3431 Malignant neoplasm of lower lobe, right bronchus or lung: Secondary | ICD-10-CM | POA: Diagnosis not present

## 2022-11-16 DIAGNOSIS — C3491 Malignant neoplasm of unspecified part of right bronchus or lung: Secondary | ICD-10-CM

## 2022-11-16 DIAGNOSIS — C3490 Malignant neoplasm of unspecified part of unspecified bronchus or lung: Secondary | ICD-10-CM | POA: Diagnosis not present

## 2022-11-16 LAB — CMP (CANCER CENTER ONLY)
ALT: 26 U/L (ref 0–44)
AST: 20 U/L (ref 15–41)
Albumin: 3.7 g/dL (ref 3.5–5.0)
Alkaline Phosphatase: 58 U/L (ref 38–126)
Anion gap: 5 (ref 5–15)
BUN: 19 mg/dL (ref 8–23)
CO2: 33 mmol/L — ABNORMAL HIGH (ref 22–32)
Calcium: 9.1 mg/dL (ref 8.9–10.3)
Chloride: 107 mmol/L (ref 98–111)
Creatinine: 1.04 mg/dL — ABNORMAL HIGH (ref 0.44–1.00)
GFR, Estimated: 58 mL/min — ABNORMAL LOW
Glucose, Bld: 85 mg/dL (ref 70–99)
Potassium: 3.7 mmol/L (ref 3.5–5.1)
Sodium: 145 mmol/L (ref 135–145)
Total Bilirubin: 0.7 mg/dL (ref 0.3–1.2)
Total Protein: 6.3 g/dL — ABNORMAL LOW (ref 6.5–8.1)

## 2022-11-16 LAB — CBC WITH DIFFERENTIAL (CANCER CENTER ONLY)
Abs Immature Granulocytes: 0.01 10*3/uL (ref 0.00–0.07)
Basophils Absolute: 0 10*3/uL (ref 0.0–0.1)
Basophils Relative: 1 %
Eosinophils Absolute: 0.3 10*3/uL (ref 0.0–0.5)
Eosinophils Relative: 5 %
HCT: 36.1 % (ref 36.0–46.0)
Hemoglobin: 11.8 g/dL — ABNORMAL LOW (ref 12.0–15.0)
Immature Granulocytes: 0 %
Lymphocytes Relative: 49 %
Lymphs Abs: 3.2 10*3/uL (ref 0.7–4.0)
MCH: 30.3 pg (ref 26.0–34.0)
MCHC: 32.7 g/dL (ref 30.0–36.0)
MCV: 92.6 fL (ref 80.0–100.0)
Monocytes Absolute: 0.5 10*3/uL (ref 0.1–1.0)
Monocytes Relative: 8 %
Neutro Abs: 2.3 10*3/uL (ref 1.7–7.7)
Neutrophils Relative %: 37 %
Platelet Count: 175 10*3/uL (ref 150–400)
RBC: 3.9 MIL/uL (ref 3.87–5.11)
RDW: 15.1 % (ref 11.5–15.5)
WBC Count: 6.4 10*3/uL (ref 4.0–10.5)
nRBC: 0 % (ref 0.0–0.2)

## 2022-11-16 NOTE — Progress Notes (Signed)
[facility] Telephone:([phone]   Fax:(336) [phone]  OFFICE PROGRESS NOTE  Roger Kill, PA-C [address] [place] [address]  DIAGNOSIS: Recurrent non-small cell lung cancer initially diagnosed as stage Ib (T2 a, N0, M0) non-small cell lung cancer, adenocarcinoma in September 2019.  The patient had bilateral pulmonary nodules including 2 nodules in the left upper lobe and 1 nodule in the right middle lobe in December 2021. Biomarker Findings Microsatellite status - MS-Stable Tumor Mutational Burden - 4 Muts/Mb Genomic Findings For a complete list of the genes assayed, please refer to the Appendix. 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 7 Disease relevant genes with no reportable alterations: ALK, BRAF, ERBB2, KRAS, MET, RET, ROS1  PDL1 Expression: 1%.  PRIOR THERAPY: Status post right lower lobectomy with lymph node dissection on February 10, 2018 under the care of Dr. Tyrone Sage.  CURRENT THERAPY: Tagrisso 80 mg p.o. daily.  First dose started on September 12, 2020.  Status post 26 months of treatment.  INTERVAL HISTORY: Shelby Green 72 y.o. female returns to the clinic today for follow-up visit.  The patient was lost to follow-up for the last 6 months.  She is feeling fine today with no concerning complaints except for intermittent diarrhea.  This is improved with Imodium.  She was under a lot of stress recently with her husband being diagnosed with liver cancer.  She denied having any chest pain, shortness of breath, cough or hemoptysis.  She has no nausea, vomiting, but has some intermittent diarrhea with no abdominal pain or constipation.  She denied having any recent weight loss or night sweats.  She is here today for evaluation and repeat blood work.  MEDICAL HISTORY: Past Medical History:  Diagnosis Date   Allergy    Back pain    Cancer ([facility])    Hypertension    Hypertension    Lung cancer ([facility])  01/2018   Lung mass    Wears dentures    Wears glasses     ALLERGIES:  is allergic to metoprolol and penicillin g.  MEDICATIONS:  Current Outpatient Medications  Medication Sig Dispense Refill   doxazosin (CARDURA) 4 MG tablet Take 4 mg by mouth daily.     latanoprost (XALATAN) 0.005 % ophthalmic solution      osimertinib mesylate (TAGRISSO) 80 MG tablet Take 1 tablet (80 mg total) by mouth daily. [place] to [place] and [phone] earlier. 30 tablet 3   No current facility-administered medications for this visit.    SURGICAL HISTORY:  Past Surgical History:  Procedure Laterality Date   COLONOSCOPY W/ BIOPSIES AND POLYPECTOMY     MULTIPLE TOOTH EXTRACTIONS     VIDEO ASSISTED THORACOSCOPY (VATS)/WEDGE RESECTION Right 02/14/2018   Procedure: RIGHT  VIDEO ASSISTED THORACOSCOPY (VATS) WITH RIGHT LOWER LOBE (RLL) WEDGE RESECTION, COMPLETION RLL LOBECTOMY, LYMPH NODE DISSECTION, ON-Q PLACEMENT;  Surgeon: Delight Ovens, MD;  Location: [facility];  Service: Thoracic;  Laterality: Right;   VIDEO BRONCHOSCOPY N/A 02/14/2018   Procedure: VIDEO BRONCHOSCOPY;  Surgeon: Delight Ovens, MD;  Location: [facility];  Service: Thoracic;  Laterality: N/A;    REVIEW OF SYSTEMS:  Constitutional: positive for fatigue Eyes: negative Ears, nose, mouth, throat, and face: negative Respiratory: negative Cardiovascular: negative Gastrointestinal: positive for diarrhea Genitourinary:negative Integument/breast: negative Hematologic/lymphatic: negative Musculoskeletal:negative Neurological: negative Behavioral/Psych: negative Endocrine: negative Allergic/Immunologic: negative   PHYSICAL EXAMINATION: General appearance: alert, cooperative, fatigued, and no distress Head: Normocephalic, without obvious  abnormality, atraumatic Neck: no adenopathy, no JVD, supple, symmetrical, trachea midline, and thyroid not enlarged, symmetric, no tenderness/mass/nodules Lymph nodes: Cervical, supraclavicular, and axillary  nodes normal. Resp: clear to auscultation bilaterally Back: symmetric, no curvature. ROM normal. No CVA tenderness. Cardio: regular rate and rhythm, S1, S2 normal, no murmur, click, rub or gallop GI: soft, non-tender; bowel sounds normal; no masses,  no organomegaly Extremities: extremities normal, atraumatic, no cyanosis or edema Neurologic: Alert and oriented X 3, normal strength and tone. Normal symmetric reflexes. Normal coordination and gait  ECOG PERFORMANCE STATUS: 1 - Symptomatic but completely ambulatory  Blood pressure (!) 155/84, pulse 62, temperature 97.9 F (36.6 C), temperature source Oral, resp. rate 17, height 5\' 4"  (1.626 m), weight 142 lb 12.8 oz (64.8 kg), SpO2 100 %.  LABORATORY DATA: Lab Results  Component Value Date   WBC 6.4 11/16/2022   HGB 11.8 (L) 11/16/2022   HCT 36.1 11/16/2022   MCV 92.6 11/16/2022   PLT 175 11/16/2022      Chemistry      Component Value Date/Time   NA 144 04/25/2022 0957   K 4.3 04/25/2022 0957   CL 110 04/25/2022 0957   CO2 31 04/25/2022 0957   BUN 16 04/25/2022 0957   CREATININE 1.19 (H) 04/25/2022 0957   CREATININE 0.83 08/06/2015 1616      Component Value Date/Time   CALCIUM 9.4 04/25/2022 0957   ALKPHOS 53 04/25/2022 0957   AST 23 04/25/2022 0957   ALT 25 04/25/2022 0957   BILITOT 0.7 04/25/2022 0957       RADIOGRAPHIC STUDIES: No results found.  ASSESSMENT AND PLAN: This is a very pleasant 72 years old African-American female with highly suspicious recurrent non-small cell lung cancer, initially diagnosed as a stage Ib (T2 a, N0, M0) non-small cell lung cancer, adenocarcinoma status post right lower lobectomy with lymph node dissection in September 2019 under the care of Dr. Tyrone Sage.  The patient had evidence for disease recurrence in December 2021 presenting with 2 pulmonary nodules in the left upper lobe as well as 1 nodule in the right middle lobe. She had molecular studies by foundation 1 that showed positive  EGFR mutation with deletion in exon 19.  Her PD-L1 expression is 1%. The patient was found on previous imaging studies to have evidence for disease progression with enlarging pulmonary nodules. She started treatment with Tagrisso 80 mg p.o. daily on September 12, 2020.  She is status post 26 months of treatment. The patient has been tolerating her treatment well with no concerning adverse effect except for the fatigue and few episodes of diarrhea. I recommended for her to continue her current treatment with Tagrisso with the same dose. I will see her back for follow-up visit in 2 weeks for evaluation with repeat CT scan of the chest, abdomen and pelvis for restaging of her disease. The patient was advised to call immediately if she has any other concerning symptoms in the interval. The patient voices understanding of current disease status and treatment options and is in agreement with the current care plan.  All questions were answered. The patient knows to call the clinic with any problems, questions or concerns. We can certainly see the patient much sooner if necessary.  Disclaimer: This note was dictated with voice recognition software. Similar sounding words can inadvertently be transcribed and may not be corrected upon review.

## 2022-11-17 ENCOUNTER — Other Ambulatory Visit: Payer: Self-pay

## 2022-11-18 ENCOUNTER — Telehealth: Payer: Self-pay | Admitting: Internal Medicine

## 2022-11-18 NOTE — Telephone Encounter (Signed)
Scheduled per 06/26 los, patient has been called and notified.

## 2022-11-20 ENCOUNTER — Ambulatory Visit (HOSPITAL_BASED_OUTPATIENT_CLINIC_OR_DEPARTMENT_OTHER)
Admission: RE | Admit: 2022-11-20 | Discharge: 2022-11-20 | Disposition: A | Discharge status: Home / Self Care | Payer: Medicare HMO | Source: Ambulatory Visit | Attending: Internal Medicine | Admitting: Internal Medicine

## 2022-11-20 ENCOUNTER — Other Ambulatory Visit: Payer: Self-pay | Admitting: Internal Medicine

## 2022-11-20 DIAGNOSIS — C3490 Malignant neoplasm of unspecified part of unspecified bronchus or lung: Secondary | ICD-10-CM | POA: Diagnosis present

## 2022-11-20 MED ORDER — IOHEXOL 300 MG/ML  SOLN
100.0000 mL | Freq: Once | INTRAMUSCULAR | Status: AC | PRN
  Administered 2022-11-20: 75 mL via INTRAVENOUS

## 2022-11-25 ENCOUNTER — Other Ambulatory Visit (HOSPITAL_COMMUNITY): Payer: Medicare HMO

## 2022-11-29 ENCOUNTER — Other Ambulatory Visit: Payer: Self-pay

## 2022-11-29 ENCOUNTER — Inpatient Hospital Stay: Payer: Medicare HMO | Attending: Internal Medicine | Admitting: Internal Medicine

## 2022-11-29 VITALS — BP 140/82 | HR 68 | Temp 98.5°F | Resp 16 | Ht 64.0 in | Wt 143.0 lb

## 2022-11-29 DIAGNOSIS — C3491 Malignant neoplasm of unspecified part of right bronchus or lung: Secondary | ICD-10-CM

## 2022-11-29 DIAGNOSIS — C3431 Malignant neoplasm of lower lobe, right bronchus or lung: Secondary | ICD-10-CM | POA: Diagnosis present

## 2022-11-29 NOTE — Progress Notes (Signed)
[facility] [phone]   [phone]  OFFICE PROGRESS NOTE  Roger Kill, PA-C (Inactive) No address on file  DIAGNOSIS: Recurrent non-small cell lung cancer initially diagnosed as stage Ib (T2 a, N0, M0) non-small cell lung cancer, adenocarcinoma in September 2019.  The patient had bilateral pulmonary nodules including 2 nodules in the left upper lobe and 1 nodule in the right middle lobe in December 2021. Biomarker Findings Microsatellite status - MS-Stable Tumor Mutational Burden - 4 Muts/Mb Genomic Findings For a complete list of the genes assayed, please refer to the Appendix. 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 7 Disease relevant genes with no reportable alterations: ALK, BRAF, ERBB2, KRAS, MET, RET, ROS1  PDL1 Expression: 1%.  PRIOR THERAPY: Status post right lower lobectomy with lymph node dissection on February 10, 2018 under the care of Dr. Tyrone Sage.  CURRENT THERAPY: Tagrisso 80 mg p.o. daily.  First dose started on September 12, 2020.  Status post 27 months of treatment.  INTERVAL HISTORY: Shelby Green 72 y.o. female return to the clinic today for follow-up visit.  The patient is feeling fine today with no concerning complaints.  She denied having any current chest pain, shortness of breath, cough or hemoptysis.  She has no nausea, vomiting, diarrhea or constipation.  She has no headache or visual changes.  She denied having any fever or chills.  She has no significant weight loss or night sweats.  She had repeat CT scan of the chest, abdomen and pelvis performed recently and she is here for evaluation and discussion of her scan results.  MEDICAL HISTORY: Past Medical History:  Diagnosis Date   Allergy    Back pain    Cancer (HCC)    Hypertension    Hypertension    Lung cancer (HCC) 01/2018   Lung mass    Wears dentures    Wears glasses     ALLERGIES:  is allergic to metoprolol  and penicillin g.  MEDICATIONS:  Current Outpatient Medications  Medication Sig Dispense Refill   doxazosin (CARDURA) 4 MG tablet Take 4 mg by mouth daily.     latanoprost (XALATAN) 0.005 % ophthalmic solution      osimertinib mesylate (TAGRISSO) 80 MG tablet Take 1 tablet (80 mg total) by mouth daily. [place]axed to [place] and [phone] earlier. 30 tablet 3   No current facility-administered medications for this visit.    SURGICAL HISTORY:  Past Surgical History:  Procedure Laterality Date   COLONOSCOPY W/ BIOPSIES AND POLYPECTOMY     MULTIPLE TOOTH EXTRACTIONS     VIDEO ASSISTED THORACOSCOPY (VATS)/WEDGE RESECTION Right 02/14/2018   Procedure: RIGHT  VIDEO ASSISTED THORACOSCOPY (VATS) WITH RIGHT LOWER LOBE (RLL) WEDGE RESECTION, COMPLETION RLL LOBECTOMY, LYMPH NODE DISSECTION, ON-Q PLACEMENT;  Surgeon: Delight Ovens, MD;  Location: [facility] OR;  Service: Thoracic;  Laterality: Right;   VIDEO BRONCHOSCOPY N/A 02/14/2018   Procedure: VIDEO BRONCHOSCOPY;  Surgeon: Delight Ovens, MD;  Location: [facility] OR;  Service: Thoracic;  Laterality: N/A;    REVIEW OF SYSTEMS:  Constitutional: negative Eyes: negative Ears, nose, mouth, throat, and face: negative Respiratory: negative Cardiovascular: negative Gastrointestinal: positive for diarrhea Genitourinary:negative Integument/breast: negative Hematologic/lymphatic: negative Musculoskeletal:negative Neurological: negative Behavioral/Psych: negative Endocrine: negative Allergic/Immunologic: negative   PHYSICAL EXAMINATION: General appearance: alert, cooperative, and no distress Head: Normocephalic, without obvious abnormality, atraumatic Neck: no adenopathy, no JVD, supple, symmetrical, trachea midline, and thyroid not enlarged, symmetric, no tenderness/mass/nodules Lymph nodes: Cervical,  supraclavicular, and axillary nodes normal. Resp: clear to auscultation bilaterally Back: symmetric, no curvature. ROM normal. No CVA tenderness. Cardio:  regular rate and rhythm, S1, S2 normal, no murmur, click, rub or gallop GI: soft, non-tender; bowel sounds normal; no masses,  no organomegaly Extremities: extremities normal, atraumatic, no cyanosis or edema Neurologic: Alert and oriented X 3, normal strength and tone. Normal symmetric reflexes. Normal coordination and gait  ECOG PERFORMANCE STATUS: 1 - Symptomatic but completely ambulatory  Blood pressure (!) 140/82, pulse 68, temperature 98.5 F (36.9 C), temperature source Oral, resp. rate 16, height 5\' 4"  (1.626 m), weight 143 lb (64.9 kg), SpO2 100 %.  LABORATORY DATA: Lab Results  Component Value Date   WBC 6.4 11/16/2022   HGB 11.8 (L) 11/16/2022   HCT 36.1 11/16/2022   MCV 92.6 11/16/2022   PLT 175 11/16/2022      Chemistry      Component Value Date/Time   NA 145 11/16/2022 0816   K 3.7 11/16/2022 0816   CL 107 11/16/2022 0816   CO2 33 (H) 11/16/2022 0816   BUN 19 11/16/2022 0816   CREATININE 1.04 (H) 11/16/2022 0816   CREATININE 0.83 08/06/2015 1616      Component Value Date/Time   CALCIUM 9.1 11/16/2022 0816   ALKPHOS 58 11/16/2022 0816   AST 20 11/16/2022 0816   ALT 26 11/16/2022 0816   BILITOT 0.7 11/16/2022 0816       RADIOGRAPHIC STUDIES: CT CHEST ABDOMEN PELVIS W CONTRAST  Result Date: 11/20/2022 CLINICAL DATA:  Non-small cell lung cancer staging, status post right lower lobectomy * Tracking Code: BO * EXAM: CT CHEST, ABDOMEN, AND PELVIS WITH CONTRAST TECHNIQUE: Multidetector CT imaging of the chest, abdomen and pelvis was performed following the standard protocol during bolus administration of intravenous contrast. RADIATION DOSE REDUCTION: This exam was performed according to the departmental dose-optimization program which includes automated exposure control, adjustment of the mA and/or kV according to patient size and/or use of iterative reconstruction technique. CONTRAST:  75mL OMNIPAQUE IOHEXOL 300 MG/ML  SOLN COMPARISON:  CT chest, 04/27/2022 CT  abdomen pelvis, 03/01/2021 FINDINGS: CT CHEST FINDINGS Cardiovascular: Aortic atherosclerosis. Normal heart size. No pericardial effusion. Mediastinum/Nodes: No enlarged mediastinal, hilar, or axillary lymph nodes. Thyroid gland, trachea, and esophagus demonstrate no significant findings. Lungs/Pleura: Status post right lower lobectomy. Unchanged postoperative appearance of the right chest, including rounded subpleural scarring measuring 1.8 x 0.7 cm (series 4, image 94). No pleural effusion or pneumothorax. Musculoskeletal: No chest wall abnormality. No acute osseous findings. CT ABDOMEN PELVIS FINDINGS Hepatobiliary: No solid liver abnormality is seen. Unchanged tiny subcentimeter cysts. No gallstones, gallbladder wall thickening, or biliary dilatation. Pancreas: Unremarkable. No pancreatic ductal dilatation or surrounding inflammatory changes. Spleen: Normal in size without significant abnormality. Adrenals/Urinary Tract: Adrenal glands are unremarkable. Simple, benign left renal cortical cyst, for which no further follow-up or characterization is required. Kidneys are otherwise normal, without renal calculi, solid lesion, or hydronephrosis. Bladder is unremarkable. Stomach/Bowel: Stomach is within normal limits. Appendix appears normal. No evidence of bowel wall thickening, distention, or inflammatory changes. Descending and sigmoid diverticulosis. Vascular/Lymphatic: Aortic atherosclerosis. No enlarged abdominal or pelvic lymph nodes. Reproductive: No mass or other abnormality. Other: Fat containing umbilical hernia.  No ascites. Musculoskeletal: No acute osseous findings. 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 IMPRESSION: 1. Status post right lower lobectomy. Unchanged postoperative appearance of the right chest, including rounded subpleural scarring peripherally. 2. No evidence of recurrent or metastatic disease in the chest,  abdomen, or pelvis. 3. Descending and sigmoid  diverticulosis without evidence of acute diverticulitis. Aortic Atherosclerosis (ICD10-I70.0). Electronically Signed   By: Jearld Lesch M.D.   On: 11/20/2022 23:01    ASSESSMENT AND PLAN: This is a very pleasant 72 years old African-American female with highly suspicious recurrent non-small cell lung cancer, initially diagnosed as a stage Ib (T2 a, N0, M0) non-small cell lung cancer, adenocarcinoma status post right lower lobectomy with lymph node dissection in September 2019 under the care of Dr. Tyrone Sage.  The patient had evidence for disease recurrence in December 2021 presenting with 2 pulmonary nodules in the left upper lobe as well as 1 nodule in the right middle lobe. 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  Her PD-L1 expression is 1%. The patient was found on previous imaging studies to have evidence for disease progression with enlarging pulmonary nodules. She started treatment with Tagrisso 80 mg p.o. daily on September 12, 2020.  She is status post 27 months of treatment. The patient has been tolerating her treatment fairly well except for occasional diarrhea. She had repeat CT scan of the chest, abdomen and pelvis performed recently.  I personally and independently reviewed the scan images and discussed the result with the patient today. Her scan showed no concerning findings for disease recurrence or metastasis. I recommended for her to continue her current treatment with Tagrisso with the same dose. I will see her back for follow-up visit in 3 months for evaluation and repeat blood work. She was advised to call immediately if she has any other concerning symptoms in the interval. The patient voices understanding of current disease status and treatment options and is in agreement with the current care plan. The total time spent in the appointment was 30 minutes.  All questions were answered. The patient knows to call the clinic  with any problems, questions or concerns. We can certainly see the patient much sooner if necessary.  Disclaimer: This note was dictated with voice recognition software. Similar sounding words can inadvertently be transcribed and may not be corrected upon review.

## 2022-11-30 ENCOUNTER — Other Ambulatory Visit: Payer: Self-pay

## 2022-12-01 ENCOUNTER — Other Ambulatory Visit: Payer: Self-pay

## 2022-12-16 ENCOUNTER — Other Ambulatory Visit: Payer: Self-pay

## 2023-03-02 ENCOUNTER — Inpatient Hospital Stay: Payer: Medicare HMO | Admitting: Internal Medicine

## 2023-03-02 ENCOUNTER — Inpatient Hospital Stay: Payer: Medicare HMO | Attending: Internal Medicine

## 2023-03-02 VITALS — BP 170/90 | HR 62 | Temp 97.7°F | Resp 17 | Ht 64.0 in | Wt 143.1 lb

## 2023-03-02 DIAGNOSIS — I10 Essential (primary) hypertension: Secondary | ICD-10-CM | POA: Insufficient documentation

## 2023-03-02 DIAGNOSIS — F419 Anxiety disorder, unspecified: Secondary | ICD-10-CM | POA: Insufficient documentation

## 2023-03-02 DIAGNOSIS — C3491 Malignant neoplasm of unspecified part of right bronchus or lung: Secondary | ICD-10-CM

## 2023-03-02 DIAGNOSIS — R0600 Dyspnea, unspecified: Secondary | ICD-10-CM | POA: Diagnosis not present

## 2023-03-02 DIAGNOSIS — R059 Cough, unspecified: Secondary | ICD-10-CM | POA: Diagnosis not present

## 2023-03-02 DIAGNOSIS — Z880 Allergy status to penicillin: Secondary | ICD-10-CM | POA: Diagnosis not present

## 2023-03-02 DIAGNOSIS — Z79899 Other long term (current) drug therapy: Secondary | ICD-10-CM | POA: Insufficient documentation

## 2023-03-02 DIAGNOSIS — R5383 Other fatigue: Secondary | ICD-10-CM | POA: Insufficient documentation

## 2023-03-02 DIAGNOSIS — C3490 Malignant neoplasm of unspecified part of unspecified bronchus or lung: Secondary | ICD-10-CM | POA: Diagnosis not present

## 2023-03-02 DIAGNOSIS — C3431 Malignant neoplasm of lower lobe, right bronchus or lung: Secondary | ICD-10-CM | POA: Insufficient documentation

## 2023-03-02 DIAGNOSIS — R197 Diarrhea, unspecified: Secondary | ICD-10-CM | POA: Diagnosis not present

## 2023-03-02 DIAGNOSIS — R531 Weakness: Secondary | ICD-10-CM | POA: Diagnosis not present

## 2023-03-02 LAB — CBC WITH DIFFERENTIAL (CANCER CENTER ONLY)
Abs Immature Granulocytes: 0 10*3/uL (ref 0.00–0.07)
Basophils Absolute: 0 10*3/uL (ref 0.0–0.1)
Basophils Relative: 1 %
Eosinophils Absolute: 0.2 10*3/uL (ref 0.0–0.5)
Eosinophils Relative: 4 %
HCT: 35.8 % — ABNORMAL LOW (ref 36.0–46.0)
Hemoglobin: 11.8 g/dL — ABNORMAL LOW (ref 12.0–15.0)
Immature Granulocytes: 0 %
Lymphocytes Relative: 38 %
Lymphs Abs: 1.5 10*3/uL (ref 0.7–4.0)
MCH: 30.5 pg (ref 26.0–34.0)
MCHC: 33 g/dL (ref 30.0–36.0)
MCV: 92.5 fL (ref 80.0–100.0)
Monocytes Absolute: 0.4 10*3/uL (ref 0.1–1.0)
Monocytes Relative: 9 %
Neutro Abs: 1.9 10*3/uL (ref 1.7–7.7)
Neutrophils Relative %: 48 %
Platelet Count: 128 10*3/uL — ABNORMAL LOW (ref 150–400)
RBC: 3.87 MIL/uL (ref 3.87–5.11)
RDW: 14 % (ref 11.5–15.5)
WBC Count: 3.9 10*3/uL — ABNORMAL LOW (ref 4.0–10.5)
nRBC: 0 % (ref 0.0–0.2)

## 2023-03-02 LAB — CMP (CANCER CENTER ONLY)
ALT: 18 U/L (ref 0–44)
AST: 20 U/L (ref 15–41)
Albumin: 4.1 g/dL (ref 3.5–5.0)
Alkaline Phosphatase: 60 U/L (ref 38–126)
Anion gap: 4 — ABNORMAL LOW (ref 5–15)
BUN: 20 mg/dL (ref 8–23)
CO2: 30 mmol/L (ref 22–32)
Calcium: 9.2 mg/dL (ref 8.9–10.3)
Chloride: 106 mmol/L (ref 98–111)
Creatinine: 1.12 mg/dL — ABNORMAL HIGH (ref 0.44–1.00)
GFR, Estimated: 53 mL/min — ABNORMAL LOW (ref >60)
Glucose, Bld: 81 mg/dL (ref 70–99)
Potassium: 3.9 mmol/L (ref 3.5–5.1)
Sodium: 140 mmol/L (ref 135–145)
Total Bilirubin: 0.8 mg/dL (ref 0.3–1.2)
Total Protein: 6.6 g/dL (ref 6.5–8.1)

## 2023-03-02 NOTE — Progress Notes (Signed)
[facility] [phone]   Fax:(336) [phone]  OFFICE PROGRESS NOTE  Shelby Green [address] [place] [address]  DIAGNOSIS: Recurrent non-small cell lung cancer initially diagnosed as stage Ib (T2 a, N0, M0) non-small cell lung cancer, adenocarcinoma in September 2019.  The patient had bilateral pulmonary nodules including 2 nodules in the left upper lobe and 1 nodule in the right middle lobe in December 2021. Biomarker Findings Microsatellite status - MS-Stable Tumor Mutational Burden - 4 Muts/Mb Genomic Findings For a complete list of the genes assayed, please refer to the Appendix. 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 7 Disease relevant genes with no reportable alterations: ALK, BRAF, ERBB2, KRAS, MET, RET, ROS1  PDL1 Expression: 1%.  PRIOR THERAPY: Status post right lower lobectomy with lymph node dissection on February 10, 2018 under the care of Dr. Tyrone Sage.  CURRENT THERAPY: Tagrisso 80 mg p.o. daily.  First dose started on September 12, 2020.  Status post 30 months of treatment.  INTERVAL HISTORY: Shelby Green 72 y.o. female returns to the clinic today for follow-up visit.  Discussed the use of AI scribe software for clinical note transcription with the patient, who gave verbal consent to proceed.  History of Present Illness   The patient, a 72 year old with a history of stage 1B non-small cell lung cancer, underwent a right lower lobectomy in September 2019. The disease recurred in December 2021, presenting as several nodules. Since then, the patient has been on daily Tagrisso (80mg ) due to a positive EGFR mutation with deletion in exon 19.  Over the past three months, the patient reports general well-being with occasional weakness. They describe a burning sensation in the stomach, similar to the feeling experienced during a CT scan. This is occasionally accompanied by mild  diarrhea and internal discomfort. The patient is unsure whether these symptoms are side effects of Tagrisso or their blood pressure medication.  The patient also reports occasional irregularities in their breathing pattern, including sporadic coughing and mild breathlessness during physical exertion. Despite this, they maintain an active lifestyle, walking four miles daily. The patient's weight has remained stable over the past few months.  The patient recently experienced a significant personal loss, with the passing of their spouse due to liver cancer. This has understandably been a challenging time for the patient.  The patient's blood pressure readings have been inconsistent, with a recent reading showing 171/92, although they attribute this to anxiety during medical visits. They are considering changing their blood pressure medication due to concerns about potential side effects, including kidney failure.       MEDICAL HISTORY: Past Medical History:  Diagnosis Date   Allergy    Back pain    Cancer (HCC)    Hypertension    Hypertension    Lung cancer (HCC) 01/2018   Lung mass    Wears dentures    Wears glasses     ALLERGIES:  is allergic to metoprolol and penicillin g.  MEDICATIONS:  Current Outpatient Medications  Medication Sig Dispense Refill   doxazosin (CARDURA) 4 MG tablet Take 4 mg by mouth daily.     latanoprost (XALATAN) 0.005 % ophthalmic solution      osimertinib mesylate (TAGRISSO) 80 MG tablet Take 1 tablet (80 mg total) by mouth daily. Faxed to [place] and [phone] earlier. 30 tablet 3   No current facility-administered medications for this visit.    SURGICAL HISTORY:  Past Surgical History:  Procedure Laterality Date   COLONOSCOPY W/ BIOPSIES AND POLYPECTOMY     MULTIPLE TOOTH EXTRACTIONS     VIDEO ASSISTED THORACOSCOPY (VATS)/WEDGE RESECTION Right 02/14/2018   Procedure: RIGHT  VIDEO ASSISTED THORACOSCOPY (VATS) WITH RIGHT LOWER LOBE (RLL) WEDGE  RESECTION, COMPLETION RLL LOBECTOMY, LYMPH NODE DISSECTION, ON-Q PLACEMENT;  Surgeon: Delight Ovens, MD;  Location: [facility] OR;  Service: Thoracic;  Laterality: Right;   VIDEO BRONCHOSCOPY N/A 02/14/2018   Procedure: VIDEO BRONCHOSCOPY;  Surgeon: Delight Ovens, MD;  Location: [facility] OR;  Service: Thoracic;  Laterality: N/A;    REVIEW OF SYSTEMS:  Constitutional: positive for fatigue Eyes: negative Ears, nose, mouth, throat, and face: negative Respiratory: negative Cardiovascular: negative Gastrointestinal: positive for diarrhea Genitourinary:negative Integument/breast: negative Hematologic/lymphatic: negative Musculoskeletal:negative Neurological: negative Behavioral/Psych: negative Endocrine: negative Allergic/Immunologic: negative   PHYSICAL EXAMINATION: General appearance: alert, cooperative, and no distress Head: Normocephalic, without obvious abnormality, atraumatic Neck: no adenopathy, no JVD, supple, symmetrical, trachea midline, and thyroid not enlarged, symmetric, no tenderness/mass/nodules Lymph nodes: Cervical, supraclavicular, and axillary nodes normal. Resp: clear to auscultation bilaterally Back: symmetric, no curvature. ROM normal. No CVA tenderness. Cardio: regular rate and rhythm, S1, S2 normal, no murmur, click, rub or gallop GI: soft, non-tender; bowel sounds normal; no masses,  no organomegaly Extremities: extremities normal, atraumatic, no cyanosis or edema Neurologic: Alert and oriented X 3, normal strength and tone. Normal symmetric reflexes. Normal coordination and gait  ECOG PERFORMANCE STATUS: 1 - Symptomatic but completely ambulatory  Blood pressure (!) 171/92, pulse 62, temperature 97.7 F (36.5 C), temperature source Oral, resp. rate 17, height 5\' 4"  (1.626 m), weight 143 lb 1.6 oz (64.9 kg), SpO2 100%.  LABORATORY DATA: Lab Results  Component Value Date   WBC 3.9 (L) 03/02/2023   HGB 11.8 (L) 03/02/2023   HCT 35.8 (L) 03/02/2023   MCV 92.5  03/02/2023   PLT 128 (L) 03/02/2023      Chemistry      Component Value Date/Time   NA 145 11/16/2022 0816   K 3.7 11/16/2022 0816   CL 107 11/16/2022 0816   CO2 33 (H) 11/16/2022 0816   BUN 19 11/16/2022 0816   CREATININE 1.04 (H) 11/16/2022 0816   CREATININE 0.83 08/06/2015 1616      Component Value Date/Time   CALCIUM 9.1 11/16/2022 0816   ALKPHOS 58 11/16/2022 0816   AST 20 11/16/2022 0816   ALT 26 11/16/2022 0816   BILITOT 0.7 11/16/2022 0816       RADIOGRAPHIC STUDIES: No results found.  ASSESSMENT AND PLAN: This is a very pleasant 72 years old African-American female with highly suspicious recurrent non-small cell lung cancer, initially diagnosed as a stage Ib (T2 a, N0, M0) non-small cell lung cancer, adenocarcinoma status post right lower lobectomy with lymph node dissection in September 2019 under the care of Dr. Tyrone Sage.  The patient had evidence for disease recurrence in December 2021 presenting with 2 pulmonary nodules in the left upper lobe as well as 1 nodule in the right middle lobe. 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  Her PD-L1 expression is 1%. The patient was found on previous imaging studies to have evidence for disease progression with enlarging pulmonary nodules. She started treatment with Tagrisso 80 mg p.o. daily on September 12, 2020.  She is status post 30 months of treatment. The patient has been tolerating this treatment fairly well with no concerning adverse effects.  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 Reports occasional discomfort, possibly related to medication, but not clearly attributable to Tagrisso. -Continue Tagrisso 80mg  daily. -Plan for CT scan in 3 months to assess disease status.  Gastrointestinal Side Effects Reports occasional burning sensation in stomach and mild diarrhea, possibly related to  Tagrisso. -Monitor symptoms and consider GI evaluation if symptoms persist or worsen.  Hypertension Blood pressure elevated at today's visit (171/92), but patient reports lower readings at home and is planning to change her antihypertensive medication. -Monitor blood pressure closely with new medication regimen.  Bereavement Recent loss of husband to liver cancer. -Consider referral to bereavement counseling or support groups if patient continues to struggle with loss.  General Health Maintenance -Continue current medications and follow-up in 3 months. -Ensure patient continues to receive Tagrisso through [employer] support program.   The patient was advised to call immediately if she has any other concerning symptoms in the interval. The patient voices understanding of current disease status and treatment options and is in agreement with the current care plan. The total time spent in the appointment was 30 minutes.  All questions were answered. The patient knows to call the clinic with any problems, questions or concerns. We can certainly see the patient much sooner if necessary.  Disclaimer: This note was dictated with voice recognition software. Similar sounding words can inadvertently be transcribed and may not be corrected upon review.

## 2023-03-04 ENCOUNTER — Other Ambulatory Visit: Payer: Self-pay

## 2023-03-08 ENCOUNTER — Encounter: Payer: Self-pay | Admitting: Physician Assistant

## 2023-03-08 DIAGNOSIS — Z0000 Encounter for general adult medical examination without abnormal findings: Secondary | ICD-10-CM

## 2023-03-09 ENCOUNTER — Telehealth: Payer: Self-pay | Admitting: Pharmacy Technician

## 2023-03-09 NOTE — Telephone Encounter (Signed)
Oral Oncology Patient Advocate Encounter   Received notification that patient is due for re-enrollment for assistance for Tagrisso through [employer].   Re-enrollment consent was completed virtually by patient.  I will update the program with grant closures starting 05/24/23  [employer] phone number [phone].   I will continue to follow until final determination.  Jinger Neighbors, CPhT-Adv Oncology Pharmacy Patient Advocate [facility] Direct Number: [phone]  Fax: [phone]

## 2023-03-13 ENCOUNTER — Other Ambulatory Visit: Payer: Self-pay | Admitting: Physician Assistant

## 2023-03-13 DIAGNOSIS — Z1231 Encounter for screening mammogram for malignant neoplasm of breast: Secondary | ICD-10-CM

## 2023-04-12 ENCOUNTER — Ambulatory Visit: Payer: Medicare HMO

## 2023-04-24 ENCOUNTER — Other Ambulatory Visit: Payer: Self-pay | Admitting: Family Medicine

## 2023-04-24 ENCOUNTER — Ambulatory Visit
Admission: RE | Admit: 2023-04-24 | Discharge: 2023-04-24 | Disposition: A | Discharge status: Home / Self Care | Payer: Medicare HMO | Source: Ambulatory Visit | Attending: Physician Assistant | Admitting: Physician Assistant

## 2023-04-24 DIAGNOSIS — Z1231 Encounter for screening mammogram for malignant neoplasm of breast: Secondary | ICD-10-CM

## 2023-05-01 ENCOUNTER — Ambulatory Visit (HOSPITAL_COMMUNITY): Payer: Medicare HMO

## 2023-05-08 ENCOUNTER — Ambulatory Visit (HOSPITAL_COMMUNITY)
Admission: RE | Admit: 2023-05-08 | Discharge: 2023-05-08 | Disposition: A | Discharge status: Home / Self Care | Payer: Medicare HMO | Source: Ambulatory Visit | Attending: Internal Medicine | Admitting: Internal Medicine

## 2023-05-08 ENCOUNTER — Inpatient Hospital Stay: Payer: Medicare HMO | Attending: Internal Medicine

## 2023-05-08 ENCOUNTER — Encounter (HOSPITAL_COMMUNITY): Payer: Self-pay

## 2023-05-08 ENCOUNTER — Other Ambulatory Visit: Payer: Self-pay | Admitting: Medical Oncology

## 2023-05-08 DIAGNOSIS — C3490 Malignant neoplasm of unspecified part of unspecified bronchus or lung: Secondary | ICD-10-CM | POA: Insufficient documentation

## 2023-05-08 DIAGNOSIS — C3431 Malignant neoplasm of lower lobe, right bronchus or lung: Secondary | ICD-10-CM | POA: Diagnosis present

## 2023-05-08 DIAGNOSIS — C3491 Malignant neoplasm of unspecified part of right bronchus or lung: Secondary | ICD-10-CM

## 2023-05-08 DIAGNOSIS — M542 Cervicalgia: Secondary | ICD-10-CM | POA: Diagnosis not present

## 2023-05-08 LAB — CMP (CANCER CENTER ONLY)
ALT: 26 U/L (ref 0–44)
AST: 26 U/L (ref 15–41)
Albumin: 4 g/dL (ref 3.5–5.0)
Alkaline Phosphatase: 57 U/L (ref 38–126)
Anion gap: 4 — ABNORMAL LOW (ref 5–15)
BUN: 16 mg/dL (ref 8–23)
CO2: 29 mmol/L (ref 22–32)
Calcium: 9.2 mg/dL (ref 8.9–10.3)
Chloride: 109 mmol/L (ref 98–111)
Creatinine: 1.07 mg/dL — ABNORMAL HIGH (ref 0.44–1.00)
GFR, Estimated: 56 mL/min — ABNORMAL LOW (ref >60)
Glucose, Bld: 99 mg/dL (ref 70–99)
Potassium: 4 mmol/L (ref 3.5–5.1)
Sodium: 142 mmol/L (ref 135–145)
Total Bilirubin: 0.7 mg/dL (ref <1.2)
Total Protein: 6.1 g/dL — ABNORMAL LOW (ref 6.5–8.1)

## 2023-05-08 LAB — CBC WITH DIFFERENTIAL (CANCER CENTER ONLY)
Abs Immature Granulocytes: 0 10*3/uL (ref 0.00–0.07)
Basophils Absolute: 0 10*3/uL (ref 0.0–0.1)
Basophils Relative: 0 %
Eosinophils Absolute: 0.1 10*3/uL (ref 0.0–0.5)
Eosinophils Relative: 4 %
HCT: 36.5 % (ref 36.0–46.0)
Hemoglobin: 12.1 g/dL (ref 12.0–15.0)
Immature Granulocytes: 0 %
Lymphocytes Relative: 49 %
Lymphs Abs: 1.4 10*3/uL (ref 0.7–4.0)
MCH: 30 pg (ref 26.0–34.0)
MCHC: 33.2 g/dL (ref 30.0–36.0)
MCV: 90.6 fL (ref 80.0–100.0)
Monocytes Absolute: 0.3 10*3/uL (ref 0.1–1.0)
Monocytes Relative: 9 %
Neutro Abs: 1.1 10*3/uL — ABNORMAL LOW (ref 1.7–7.7)
Neutrophils Relative %: 38 %
Platelet Count: 136 10*3/uL — ABNORMAL LOW (ref 150–400)
RBC: 4.03 MIL/uL (ref 3.87–5.11)
RDW: 13.6 % (ref 11.5–15.5)
WBC Count: 2.9 10*3/uL — ABNORMAL LOW (ref 4.0–10.5)
nRBC: 0 % (ref 0.0–0.2)

## 2023-05-08 MED ORDER — OSIMERTINIB MESYLATE 80 MG PO TABS: 80.0000 mg | ORAL_TABLET | Freq: Every day | ORAL | 3 refills | Status: DC

## 2023-05-08 MED ORDER — IOHEXOL 300 MG/ML  SOLN
100.0000 mL | Freq: Once | INTRAMUSCULAR | Status: AC | PRN
  Administered 2023-05-08: 100 mL via INTRAVENOUS

## 2023-05-15 ENCOUNTER — Ambulatory Visit: Payer: Medicare HMO | Admitting: Internal Medicine

## 2023-05-16 ENCOUNTER — Inpatient Hospital Stay: Payer: Medicare HMO | Admitting: Internal Medicine

## 2023-05-16 ENCOUNTER — Ambulatory Visit: Payer: Medicare HMO | Admitting: Internal Medicine

## 2023-05-16 VITALS — BP 130/87 | HR 73 | Temp 97.3°F | Resp 17 | Ht 64.0 in | Wt 139.8 lb

## 2023-05-16 DIAGNOSIS — C3491 Malignant neoplasm of unspecified part of right bronchus or lung: Secondary | ICD-10-CM

## 2023-05-16 DIAGNOSIS — C3431 Malignant neoplasm of lower lobe, right bronchus or lung: Secondary | ICD-10-CM | POA: Diagnosis not present

## 2023-05-16 NOTE — Progress Notes (Signed)
[facility] [phone]   [phone]  OFFICE PROGRESS NOTE  Odis Luster, PA-C [address] [place] [address]  DIAGNOSIS: Recurrent non-small cell lung cancer initially diagnosed as stage Ib (T2 a, N0, M0) non-small cell lung cancer, adenocarcinoma in September 2019.  The patient had bilateral pulmonary nodules including 2 nodules in the left upper lobe and 1 nodule in the right middle lobe in December 2021. Biomarker Findings Microsatellite status - MS-Stable Tumor Mutational Burden - 4 Muts/Mb Genomic Findings For a complete list of the genes assayed, please refer to the Appendix. 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 7 Disease relevant genes with no reportable alterations: ALK, BRAF, ERBB2, KRAS, MET, RET, ROS1  PDL1 Expression: 1%.  PRIOR THERAPY: Status post right lower lobectomy with lymph node dissection on February 10, 2018 under the care of Dr. Tyrone Sage.  CURRENT THERAPY: Tagrisso 80 mg p.o. daily.  First dose started on September 12, 2020.  Status post 32  months of treatment.  INTERVAL HISTORY: Shelby Green 72 y.o. female returns to the clinic today for follow-up visit. Discussed the use of AI scribe software for clinical note transcription with the patient, who gave verbal consent to proceed.  History of Present Illness   Shelby Green, a 72 year old patient with a history of stage 1B non-small cell lung cancer (NSCLC), was diagnosed in September 2019. The patient experienced a recurrence of cancer in early 2022, at which point treatment with Tagrisso was initiated due to a positive EGFR mutation. The patient has been on Tagrisso 80mg  once daily since April 2022, for a total of 32 months.  The patient reports a new symptom of neck soreness, particularly on the left side. The nature of the discomfort is not like a cramp, but more of a persistent soreness. The patient is unsure of the cause  and does not recall any previous diagnosis of disc disease or arthritis in the neck area.  The patient also reports occasional nausea but denies any changes in bowel habits. There is no mention of any other new or worsening symptoms.  The patient has had recent imaging, with no concerning disease progression. The patient also mentions an upcoming visit to a radiology department at a different hospital, but is unsure of the reason for this appointment.  The patient also mentions plans to travel to [place] in February and has previously taken medication when traveling to prevent infection. The patient is unsure if similar precautions will be needed for this trip.       MEDICAL HISTORY: Past Medical History:  Diagnosis Date   Allergy    Back pain    Cancer (HCC)    Hypertension    Hypertension    Lung cancer (HCC) 01/2018   Lung mass    Wears dentures    Wears glasses     ALLERGIES:  is allergic to metoprolol and penicillin g.  MEDICATIONS:  Current Outpatient Medications  Medication Sig Dispense Refill   ascorbic acid (VITAMIN C) 500 MG tablet Take 500 mg by mouth daily.     carvedilol (COREG) 3.125 MG tablet Take 3.125 mg by mouth 2 (two) times daily with a meal.     Cholecalciferol 125 MCG (5000 UT) capsule Take 5,000 Units by mouth daily.     doxazosin (CARDURA) 4 MG tablet Take 4 mg by mouth daily.     latanoprost (XALATAN) 0.005 % ophthalmic solution  osimertinib mesylate (TAGRISSO) 80 MG tablet Take 1 tablet (80 mg total) by mouth daily. 30 tablet 3   Oyster Shell Calcium 500 MG TABS Take 1 tablet by mouth daily.     No current facility-administered medications for this visit.    SURGICAL HISTORY:  Past Surgical History:  Procedure Laterality Date   BREAST BIOPSY Left 03/23/2022   X 2  REACTIVE LYMPHOID HYPERPLASIA  and FIBROADENOMA   COLONOSCOPY W/ BIOPSIES AND POLYPECTOMY     MULTIPLE TOOTH EXTRACTIONS     VIDEO ASSISTED THORACOSCOPY (VATS)/WEDGE RESECTION  Right 02/14/2018   Procedure: RIGHT  VIDEO ASSISTED THORACOSCOPY (VATS) WITH RIGHT LOWER LOBE (RLL) WEDGE RESECTION, COMPLETION RLL LOBECTOMY, LYMPH NODE DISSECTION, ON-Q PLACEMENT;  Surgeon: Delight Ovens, MD;  Location: [facility] OR;  Service: Thoracic;  Laterality: Right;   VIDEO BRONCHOSCOPY N/A 02/14/2018   Procedure: VIDEO BRONCHOSCOPY;  Surgeon: Delight Ovens, MD;  Location: [facility] OR;  Service: Thoracic;  Laterality: N/A;    REVIEW OF SYSTEMS:  Constitutional: negative Eyes: negative Ears, nose, mouth, throat, and face: negative Respiratory: negative Cardiovascular: negative Gastrointestinal: negative Genitourinary:negative Integument/breast: negative Hematologic/lymphatic: negative Musculoskeletal:positive for arthralgias Neurological: negative Behavioral/Psych: negative Endocrine: negative Allergic/Immunologic: negative   PHYSICAL EXAMINATION: General appearance: alert, cooperative, and no distress Head: Normocephalic, without obvious abnormality, atraumatic Neck: no adenopathy, no JVD, supple, symmetrical, trachea midline, and thyroid not enlarged, symmetric, no tenderness/mass/nodules Lymph nodes: Cervical, supraclavicular, and axillary nodes normal. Resp: clear to auscultation bilaterally Back: symmetric, no curvature. ROM normal. No CVA tenderness. Cardio: regular rate and rhythm, S1, S2 normal, no murmur, click, rub or gallop GI: soft, non-tender; bowel sounds normal; no masses,  no organomegaly Extremities: extremities normal, atraumatic, no cyanosis or edema Neurologic: Alert and oriented X 3, normal strength and tone. Normal symmetric reflexes. Normal coordination and gait  ECOG PERFORMANCE STATUS: 1 - Symptomatic but completely ambulatory  Blood pressure 130/87, pulse 73, temperature (!) 97.3 F (36.3 C), temperature source Temporal, resp. rate 17, height 5\' 4"  (1.626 m), weight 139 lb 12.8 oz (63.4 kg), SpO2 99%.  LABORATORY DATA: Lab Results  Component Value  Date   WBC 2.9 (L) 05/08/2023   HGB 12.1 05/08/2023   HCT 36.5 05/08/2023   MCV 90.6 05/08/2023   PLT 136 (L) 05/08/2023      Chemistry      Component Value Date/Time   NA 142 05/08/2023 0820   K 4.0 05/08/2023 0820   CL 109 05/08/2023 0820   CO2 29 05/08/2023 0820   BUN 16 05/08/2023 0820   CREATININE 1.07 (H) 05/08/2023 0820   CREATININE 0.83 08/06/2015 1616      Component Value Date/Time   CALCIUM 9.2 05/08/2023 0820   ALKPHOS 57 05/08/2023 0820   AST 26 05/08/2023 0820   ALT 26 05/08/2023 0820   BILITOT 0.7 05/08/2023 0820       RADIOGRAPHIC STUDIES: CT Chest W Contrast Result Date: 05/14/2023 CLINICAL DATA:  Non-small cell lung cancer, status post right lower lobectomy, for restaging EXAM: CT CHEST, ABDOMEN, AND PELVIS WITH CONTRAST TECHNIQUE: Multidetector CT imaging of the chest, abdomen and pelvis was performed following the standard protocol during bolus administration of intravenous contrast. RADIATION DOSE REDUCTION: This exam was performed according to the departmental dose-optimization program which includes automated exposure control, adjustment of the mA and/or kV according to patient size and/or use of iterative reconstruction technique. CONTRAST:  OMNIPAQUE IOHEXOL 300 MG/ML  SOLN COMPARISON:  11/20/2022 FINDINGS: CT CHEST FINDINGS Cardiovascular: Heart is normal in size.  No pericardial effusion. No evidence of thoracic aortic aneurysm. Atherosclerotic calcifications of the aortic arch. Mediastinum/Nodes: No suspicious mediastinal lymphadenopathy. Visualized thyroid is unremarkable. Lungs/Pleura: Status post right lower lobectomy. Stable smooth/rounded pleural thickening in the lateral right lower hemithorax measuring up to 1.8 cm (series 7/image 96), chronic, likely reflecting post treatment changes. 3 mm subpleural nodule the posterior right middle lobe (series 7/image 94), unchanged, benign. No new/suspicious pulmonary nodules. Mild scarring/atelectasis in  the posterior left lower lobe. No focal consolidation. No pleural effusion or pneumothorax. Musculoskeletal: 16 mm nodular lesion in the lateral right breast (series 2/image 35), unchanged from numerous priors, likely benign. No focal osseous lesions. CT ABDOMEN PELVIS FINDINGS Hepatobiliary: Small probable hepatic cysts measuring up to 6 mm (series 2/image 62). Gallbladder is unremarkable. No intrahepatic or extrahepatic ductal dilatation. Pancreas: Within normal limits. Spleen: Within normal limits. Adrenals/Urinary Tract: Adrenal glands are within normal limits. Right kidney is within normal limits. 2.2 cm simple left upper pole renal cyst (series 2/image 83), benign (Bosniak I). No follow-up is recommended. No hydronephrosis. Bladder is within normal limits. Stomach/Bowel: Stomach is within normal limits. No evidence of bowel obstruction. Normal appendix (series 2/image 36). Left colonic diverticulosis, without evidence of diverticulitis. Vascular/Lymphatic: No evidence of abdominal aortic aneurysm. Mild atherosclerotic calcifications of the bilateral common iliac arteries. No suspicious abdominopelvic lymphadenopathy. Reproductive: Retroverted uterus, grossly unremarkable. Bilateral ovaries are within normal limits. Other: No abdominopelvic ascites. Musculoskeletal: Prominent left sacral Tarlov cysts. No suspicious osseous lesions. IMPRESSION: Status post right lower lobectomy. No findings suspicious for recurrent or metastatic disease. Additional stable ancillary findings as above. Electronically Signed   By: Charline Bills M.D.   On: 05/14/2023 18:04   CT ABDOMEN PELVIS W CONTRAST Result Date: 05/14/2023 CLINICAL DATA:  Non-small cell lung cancer, status post right lower lobectomy, for restaging EXAM: CT CHEST, ABDOMEN, AND PELVIS WITH CONTRAST TECHNIQUE: Multidetector CT imaging of the chest, abdomen and pelvis was performed following the standard protocol during bolus administration of intravenous  contrast. RADIATION DOSE REDUCTION: This exam was performed according to the departmental dose-optimization program which includes automated exposure control, adjustment of the mA and/or kV according to patient size and/or use of iterative reconstruction technique. CONTRAST:  OMNIPAQUE IOHEXOL 300 MG/ML  SOLN COMPARISON:  11/20/2022 FINDINGS: CT CHEST FINDINGS Cardiovascular: Heart is normal in size.  No pericardial effusion. No evidence of thoracic aortic aneurysm. Atherosclerotic calcifications of the aortic arch. Mediastinum/Nodes: No suspicious mediastinal lymphadenopathy. Visualized thyroid is unremarkable. Lungs/Pleura: Status post right lower lobectomy. Stable smooth/rounded pleural thickening in the lateral right lower hemithorax measuring up to 1.8 cm (series 7/image 96), chronic, likely reflecting post treatment changes. 3 mm subpleural nodule the posterior right middle lobe (series 7/image 94), unchanged, benign. No new/suspicious pulmonary nodules. Mild scarring/atelectasis in the posterior left lower lobe. No focal consolidation. No pleural effusion or pneumothorax. Musculoskeletal: 16 mm nodular lesion in the lateral right breast (series 2/image 35), unchanged from numerous priors, likely benign. No focal osseous lesions. CT ABDOMEN PELVIS FINDINGS Hepatobiliary: Small probable hepatic cysts measuring up to 6 mm (series 2/image 62). Gallbladder is unremarkable. No intrahepatic or extrahepatic ductal dilatation. Pancreas: Within normal limits. Spleen: Within normal limits. Adrenals/Urinary Tract: Adrenal glands are within normal limits. Right kidney is within normal limits. 2.2 cm simple left upper pole renal cyst (series 2/image 83), benign (Bosniak I). No follow-up is recommended. No hydronephrosis. Bladder is within normal limits. Stomach/Bowel: Stomach is within normal limits. No evidence of bowel obstruction. Normal appendix (series 2/image 36). Left  colonic diverticulosis, without evidence  of diverticulitis. Vascular/Lymphatic: No evidence of abdominal aortic aneurysm. Mild atherosclerotic calcifications of the bilateral common iliac arteries. No suspicious abdominopelvic lymphadenopathy. Reproductive: Retroverted uterus, grossly unremarkable. Bilateral ovaries are within normal limits. Other: No abdominopelvic ascites. Musculoskeletal: Prominent left sacral Tarlov cysts. No suspicious osseous lesions. IMPRESSION: Status post right lower lobectomy. No findings suspicious for recurrent or metastatic disease. Additional stable ancillary findings as above. Electronically Signed   By: Charline Bills M.D.   On: 05/14/2023 18:04   MM 3D SCREENING MAMMOGRAM BILATERAL BREAST Result Date: 04/25/2023 CLINICAL DATA:  Screening. EXAM: DIGITAL SCREENING BILATERAL MAMMOGRAM WITH TOMOSYNTHESIS AND CAD TECHNIQUE: Bilateral screening digital craniocaudal and mediolateral oblique mammograms were obtained. Bilateral screening digital breast tomosynthesis was performed. The images were evaluated with computer-aided detection. COMPARISON:  Previous exam(s). ACR Breast Density Category b: There are scattered areas of fibroglandular density. FINDINGS: There are no findings suspicious for malignancy. IMPRESSION: No mammographic evidence of malignancy. A result letter of this screening mammogram will be mailed directly to the patient. RECOMMENDATION: Screening mammogram in one year. (Code:[id number]) BI-RADS CATEGORY  1: Negative. Electronically Signed   By: Hulan Saas M.D.   On: 04/25/2023 13:17    ASSESSMENT AND PLAN: This is a very pleasant 72 years old African-American female with highly suspicious recurrent non-small cell lung cancer, initially diagnosed as a stage Ib (T2 a, N0, M0) non-small cell lung cancer, adenocarcinoma status post right lower lobectomy with lymph node dissection in September 2019 under the care of Dr. Tyrone Sage.  The patient had evidence for disease recurrence in December 2021  presenting with 2 pulmonary nodules in the left upper lobe as well as 1 nodule in the right middle lobe. 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  Her PD-L1 expression is 1%. The patient was found on previous imaging studies to have evidence for disease progression with enlarging pulmonary nodules. She started treatment with Tagrisso 80 mg p.o. daily on September 12, 2020.  She is status post 32 months of treatment. The patient has been tolerating her treatment fairly well with no concerning adverse effects.    Non-Small Cell Lung Cancer (NSCLC) with EGFR Mutation Initially diagnosed with stage 1B NSCLC in September 2019. Recurrence noted in early 2022. Currently on Tagrisso (osimertinib) 80 mg daily since April 2022. Recent scans of chest, abdomen, and pelvis show no evidence of cancer growth. No brain metastases noted. Reports mild nausea but no significant diarrhea. Tagrisso has been effective in controlling the cancer, but discontinuation may lead to regrowth. - Continue Tagrisso 80 mg daily - Recheck the reason for the radiology appointment at [facility] - Follow-up in two months  Neck Soreness Reports intermittent soreness in the left side of the neck. No clear association with NSCLC or its treatment. Possible differential includes arthritis or musculoskeletal issues. - Consider referral to an orthopedic surgeon if symptoms worsen  General Health Maintenance Inquires about travel to [place] and potential need for travel-related medications. - Advise to consult the health department for travel-related medications and vaccinations  Follow-up - Schedule follow-up appointment in two months.   The patient was advised to call immediately if she has any concerning symptoms in the interval. The patient voices understanding of current disease status and treatment options and is in agreement with the current care plan. The total  time spent in the appointment was 30 minutes.  All questions were answered. The patient knows to call the  clinic with any problems, questions or concerns. We can certainly see the patient much sooner if necessary.  Disclaimer: This note was dictated with voice recognition software. Similar sounding words can inadvertently be transcribed and may not be corrected upon review.

## 2023-05-17 ENCOUNTER — Other Ambulatory Visit: Payer: Self-pay

## 2023-05-25 ENCOUNTER — Other Ambulatory Visit: Payer: Medicare HMO

## 2023-05-25 NOTE — Telephone Encounter (Signed)
 Oral Oncology Patient Advocate Encounter   Submitted grant closure form.   Application submitted via e-fax to [phone]   [employer] phone number [phone].   I will continue to check the status until final determination.   Estefana Moellers, CPhT-Adv Oncology Pharmacy Patient Advocate [facility] Direct Number: [phone]  Fax: [phone]

## 2023-05-29 NOTE — Telephone Encounter (Signed)
 Oral Oncology Patient Advocate Encounter   Received notification re-enrollment for assistance for Tagrisso  through [employer] has been approved. Patient may continue to receive their medication at $0 from this program.    [employer] phone number [phone].   Effective dates: 05/27/23 through 05/22/24  I have spoken to the patient.  Estefana Moellers, CPhT-Adv Oncology Pharmacy Patient Advocate [facility] Direct Number: [phone]  Fax: [phone]

## 2023-06-01 ENCOUNTER — Ambulatory Visit: Payer: Medicare HMO | Admitting: Internal Medicine

## 2023-06-03 ENCOUNTER — Encounter: Payer: Self-pay | Admitting: Internal Medicine

## 2023-06-26 ENCOUNTER — Encounter: Payer: Self-pay | Admitting: Internal Medicine

## 2023-06-30 ENCOUNTER — Other Ambulatory Visit: Payer: Self-pay | Admitting: *Deleted

## 2023-06-30 DIAGNOSIS — C3491 Malignant neoplasm of unspecified part of right bronchus or lung: Secondary | ICD-10-CM

## 2023-06-30 DIAGNOSIS — C3490 Malignant neoplasm of unspecified part of unspecified bronchus or lung: Secondary | ICD-10-CM

## 2023-07-03 ENCOUNTER — Inpatient Hospital Stay: Payer: No Typology Code available for payment source | Admitting: Internal Medicine

## 2023-07-03 ENCOUNTER — Inpatient Hospital Stay: Payer: No Typology Code available for payment source | Attending: Internal Medicine

## 2023-07-28 ENCOUNTER — Other Ambulatory Visit: Payer: Self-pay

## 2023-07-28 ENCOUNTER — Telehealth: Payer: Self-pay | Admitting: Medical Oncology

## 2023-07-28 DIAGNOSIS — C3491 Malignant neoplasm of unspecified part of right bronchus or lung: Secondary | ICD-10-CM

## 2023-07-28 MED ORDER — OSIMERTINIB MESYLATE 80 MG PO TABS: 80.0000 mg | ORAL_TABLET | Freq: Every day | ORAL | 3 refills | Status: DC

## 2023-07-28 NOTE — Telephone Encounter (Signed)
 Tagrisso refill sent to [facility] for auth.

## 2023-07-31 ENCOUNTER — Other Ambulatory Visit: Payer: Self-pay

## 2023-08-07 ENCOUNTER — Telehealth: Payer: Self-pay | Admitting: Internal Medicine

## 2023-08-07 NOTE — Telephone Encounter (Signed)
 Patient called to reschedule a missed appointment. The patient is aware of the changes made and will be mailed an appointment reminder.

## 2023-08-09 ENCOUNTER — Other Ambulatory Visit: Payer: Self-pay

## 2023-08-22 ENCOUNTER — Inpatient Hospital Stay: Attending: Internal Medicine

## 2023-08-22 ENCOUNTER — Inpatient Hospital Stay (HOSPITAL_BASED_OUTPATIENT_CLINIC_OR_DEPARTMENT_OTHER): Admitting: Internal Medicine

## 2023-08-22 VITALS — BP 152/74 | HR 67 | Temp 97.9°F | Resp 16 | Ht 64.0 in | Wt 144.5 lb

## 2023-08-22 DIAGNOSIS — C3490 Malignant neoplasm of unspecified part of unspecified bronchus or lung: Secondary | ICD-10-CM | POA: Diagnosis not present

## 2023-08-22 DIAGNOSIS — Z79899 Other long term (current) drug therapy: Secondary | ICD-10-CM | POA: Insufficient documentation

## 2023-08-22 DIAGNOSIS — C3491 Malignant neoplasm of unspecified part of right bronchus or lung: Secondary | ICD-10-CM

## 2023-08-22 DIAGNOSIS — I10 Essential (primary) hypertension: Secondary | ICD-10-CM | POA: Insufficient documentation

## 2023-08-22 DIAGNOSIS — Z902 Acquired absence of lung [part of]: Secondary | ICD-10-CM | POA: Insufficient documentation

## 2023-08-22 DIAGNOSIS — C3431 Malignant neoplasm of lower lobe, right bronchus or lung: Secondary | ICD-10-CM | POA: Diagnosis present

## 2023-08-22 LAB — CBC WITH DIFFERENTIAL (CANCER CENTER ONLY)
Abs Immature Granulocytes: 0.01 10*3/uL (ref 0.00–0.07)
Basophils Absolute: 0 10*3/uL (ref 0.0–0.1)
Basophils Relative: 1 %
Eosinophils Absolute: 0.2 10*3/uL (ref 0.0–0.5)
Eosinophils Relative: 5 %
HCT: 34 % — ABNORMAL LOW (ref 36.0–46.0)
Hemoglobin: 11.2 g/dL — ABNORMAL LOW (ref 12.0–15.0)
Immature Granulocytes: 0 %
Lymphocytes Relative: 29 %
Lymphs Abs: 1.3 10*3/uL (ref 0.7–4.0)
MCH: 30.9 pg (ref 26.0–34.0)
MCHC: 32.9 g/dL (ref 30.0–36.0)
MCV: 93.7 fL (ref 80.0–100.0)
Monocytes Absolute: 0.4 10*3/uL (ref 0.1–1.0)
Monocytes Relative: 9 %
Neutro Abs: 2.4 10*3/uL (ref 1.7–7.7)
Neutrophils Relative %: 56 %
Platelet Count: 176 10*3/uL (ref 150–400)
RBC: 3.63 MIL/uL — ABNORMAL LOW (ref 3.87–5.11)
RDW: 14.1 % (ref 11.5–15.5)
WBC Count: 4.3 10*3/uL (ref 4.0–10.5)
nRBC: 0 % (ref 0.0–0.2)

## 2023-08-22 LAB — CMP (CANCER CENTER ONLY)
ALT: 13 U/L (ref 0–44)
AST: 17 U/L (ref 15–41)
Albumin: 4 g/dL (ref 3.5–5.0)
Alkaline Phosphatase: 54 U/L (ref 38–126)
Anion gap: 3 — ABNORMAL LOW (ref 5–15)
BUN: 22 mg/dL (ref 8–23)
CO2: 30 mmol/L (ref 22–32)
Calcium: 9.2 mg/dL (ref 8.9–10.3)
Chloride: 108 mmol/L (ref 98–111)
Creatinine: 1.33 mg/dL — ABNORMAL HIGH (ref 0.44–1.00)
GFR, Estimated: 43 mL/min — ABNORMAL LOW (ref >60)
Glucose, Bld: 83 mg/dL (ref 70–99)
Potassium: 3.9 mmol/L (ref 3.5–5.1)
Sodium: 141 mmol/L (ref 135–145)
Total Bilirubin: 0.8 mg/dL (ref 0.0–1.2)
Total Protein: 6.4 g/dL — ABNORMAL LOW (ref 6.5–8.1)

## 2023-08-22 NOTE — Addendum Note (Signed)
 Addended by: Charma Igo on: 08/22/2023 01:12 PM   Modules accepted: Orders

## 2023-08-22 NOTE — Progress Notes (Signed)
 [facility] [phone]   Fax:([phone]  OFFICE PROGRESS NOTE  Odis Luster, PA-C [address] [place] [address]  DIAGNOSIS: Recurrent non-small cell lung cancer initially diagnosed as stage Ib (T2 a, N0, M0) non-small cell lung cancer, adenocarcinoma in September 2019.  The patient had bilateral pulmonary nodules including 2 nodules in the left upper lobe and 1 nodule in the right middle lobe in December 2021. Biomarker Findings Microsatellite status - MS-Stable Tumor Mutational Burden - 4 Muts/Mb Genomic Findings For a complete list of the genes assayed, please refer to the Appendix. 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 7 Disease relevant genes with no reportable alterations: ALK, BRAF, ERBB2, KRAS, MET, RET, ROS1  PDL1 Expression: 1%.  PRIOR THERAPY: Status post right lower lobectomy with lymph node dissection on February 10, 2018 under the care of Dr. Tyrone Sage.  CURRENT THERAPY: Tagrisso 80 mg p.o. daily.  First dose started on September 12, 2020.  Status post 35  months of treatment.  INTERVAL HISTORY: Shelby Green 73 y.o. female returns to the clinic today for follow-up visit. Discussed the use of AI scribe software for clinical note transcription with the patient, who gave verbal consent to proceed.  History of Present Illness   Shelby Green is a 73 year old female with recurrent non-small cell lung cancer who presents for evaluation with repeat blood work.  She has a history of recurrent non-small cell lung cancer, initially diagnosed as stage 1B in September 2019. Disease recurrence was noted with multiple pulmonary nodules in December 2021. 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 She has been on this treatment for 35 months.  Tagrisso sometimes makes her feel sluggish. No chest pain, breathing issues, nausea,  vomiting, or diarrhea are reported, although her blood pressure medication sometimes causes diarrhea. Her blood pressure was noted to be high today.  She feels generally good and recently returned from a trip to [place]. Sometimes eating certain foods can cause diarrhea. She read about potential side effects of Tagrisso, including a sore throat, but does not report experiencing this symptom.       MEDICAL HISTORY: Past Medical History:  Diagnosis Date   Allergy    Back pain    Cancer (HCC)    Hypertension    Hypertension    Lung cancer (HCC) 01/2018   Lung mass    Wears dentures    Wears glasses     ALLERGIES:  is allergic to metoprolol and penicillin g.  MEDICATIONS:  Current Outpatient Medications  Medication Sig Dispense Refill   ascorbic acid (VITAMIN C) 500 MG tablet Take 500 mg by mouth daily.     carvedilol (COREG) 3.125 MG tablet Take 3.125 mg by mouth 2 (two) times daily with a meal.     Cholecalciferol 125 MCG (5000 UT) capsule Take 5,000 Units by mouth daily.     doxazosin (CARDURA) 4 MG tablet Take 4 mg by mouth daily.     latanoprost (XALATAN) 0.005 % ophthalmic solution      osimertinib mesylate (TAGRISSO) 80 MG tablet Take 1 tablet (80 mg total) by mouth daily. 30 tablet 3   Oyster Shell Calcium 500 MG TABS Take 1 tablet by mouth daily.     No current facility-administered medications for this visit.    SURGICAL HISTORY:  Past Surgical History:  Procedure Laterality Date   BREAST BIOPSY Left 03/23/2022   X 2  REACTIVE LYMPHOID HYPERPLASIA  and FIBROADENOMA   COLONOSCOPY W/ BIOPSIES AND POLYPECTOMY     MULTIPLE TOOTH EXTRACTIONS     VIDEO ASSISTED THORACOSCOPY (VATS)/WEDGE RESECTION Right 02/14/2018   Procedure: RIGHT  VIDEO ASSISTED THORACOSCOPY (VATS) WITH RIGHT LOWER LOBE (RLL) WEDGE RESECTION, COMPLETION RLL LOBECTOMY, LYMPH NODE DISSECTION, ON-Q PLACEMENT;  Surgeon: Delight Ovens, MD;  Location: [facility] OR;  Service: Thoracic;  Laterality: Right;    VIDEO BRONCHOSCOPY N/A 02/14/2018   Procedure: VIDEO BRONCHOSCOPY;  Surgeon: Delight Ovens, MD;  Location: [facility] OR;  Service: Thoracic;  Laterality: N/A;    REVIEW OF SYSTEMS:  A comprehensive review of systems was negative except for: Constitutional: positive for fatigue   PHYSICAL EXAMINATION: General appearance: alert, cooperative, fatigued, and no distress Head: Normocephalic, without obvious abnormality, atraumatic Neck: no adenopathy, no JVD, supple, symmetrical, trachea midline, and thyroid not enlarged, symmetric, no tenderness/mass/nodules Lymph nodes: Cervical, supraclavicular, and axillary nodes normal. Resp: clear to auscultation bilaterally Back: symmetric, no curvature. ROM normal. No CVA tenderness. Cardio: regular rate and rhythm, S1, S2 normal, no murmur, click, rub or gallop GI: soft, non-tender; bowel sounds normal; no masses,  no organomegaly Extremities: extremities normal, atraumatic, no cyanosis or edema  ECOG PERFORMANCE STATUS: 1 - Symptomatic but completely ambulatory  Blood pressure (!) 152/74, pulse 67, temperature 97.9 F (36.6 C), temperature source Temporal, resp. rate 16, height 5\' 4"  (1.626 m), weight 144 lb 8 oz (65.5 kg), SpO2 100%.  LABORATORY DATA: Lab Results  Component Value Date   WBC 4.3 08/22/2023   HGB 11.2 (L) 08/22/2023   HCT 34.0 (L) 08/22/2023   MCV 93.7 08/22/2023   PLT 176 08/22/2023      Chemistry      Component Value Date/Time   NA 142 05/08/2023 0820   K 4.0 05/08/2023 0820   CL 109 05/08/2023 0820   CO2 29 05/08/2023 0820   BUN 16 05/08/2023 0820   CREATININE 1.07 (H) 05/08/2023 0820   CREATININE 0.83 08/06/2015 1616      Component Value Date/Time   CALCIUM 9.2 05/08/2023 0820   ALKPHOS 57 05/08/2023 0820   AST 26 05/08/2023 0820   ALT 26 05/08/2023 0820   BILITOT 0.7 05/08/2023 0820       RADIOGRAPHIC STUDIES: No results found.   ASSESSMENT AND PLAN: This is a very pleasant 73 years old African-American  female with highly suspicious recurrent non-small cell lung cancer, initially diagnosed as a stage Ib (T2 a, N0, M0) non-small cell lung cancer, adenocarcinoma status post right lower lobectomy with lymph node dissection in September 2019 under the care of Dr. Tyrone Sage.  The patient had evidence for disease recurrence in December 2021 presenting with 2 pulmonary nodules in the left upper lobe as well as 1 nodule in the right middle lobe. 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  Her PD-L1 expression is 1%. The patient was found on previous imaging studies to have evidence for disease progression with enlarging pulmonary nodules. She started treatment with Tagrisso 80 mg p.o. daily on September 12, 2020.  She is status post 35 months of treatment.  She has been tolerating her treatment fairly well.    Recurrent non-small cell lung cancer with EGFR mutation Recurrent non-small cell lung cancer with a deletion in exon 19 of the EGFR mutation. Initially diagnosed as stage IB in September 2019,  with recurrence in December 2021 presenting as multiple pulmonary nodules. She has been on osimertinib 80 mg daily since April 2022, completing 35 months of treatment. She reports feeling well overall, with no chest pain or dyspnea, but experiences some fatigue attributed to the medication. Occasional diarrhea is linked to dietary intake rather than osimertinib. Discussed potential side effects of osimertinib, including sore throat and diarrhea, emphasizing that all medications have side effects. - Continue osimertinib 80 mg PO daily - Order repeat blood work - Schedule a scan in 3 months  Hypertension Blood pressure is slightly elevated. She reports that her antihypertensive medication sometimes causes nausea, vomiting, and diarrhea. Advised to monitor blood pressure closely. - Monitor blood pressure regularly - Recheck blood pressure before leaving the  clinic   The patient was advised to call immediately if she has any concerning symptoms in the interval.  The patient voices understanding of current disease status and treatment options and is in agreement with the current care plan. The total time spent in the appointment was 20 minutes.  All questions were answered. The patient knows to call the clinic with any problems, questions or concerns. We can certainly see the patient much sooner if necessary.  Disclaimer: This note was dictated with voice recognition software. Similar sounding words can inadvertently be transcribed and may not be corrected upon review.

## 2023-08-23 ENCOUNTER — Other Ambulatory Visit: Payer: Self-pay

## 2023-10-12 ENCOUNTER — Other Ambulatory Visit: Payer: Self-pay

## 2023-10-12 DIAGNOSIS — C3491 Malignant neoplasm of unspecified part of right bronchus or lung: Secondary | ICD-10-CM

## 2023-10-12 MED ORDER — OSIMERTINIB MESYLATE 80 MG PO TABS
80.0000 mg | ORAL_TABLET | Freq: Every day | ORAL | 3 refills | Status: AC
Start: 2023-10-12 — End: ?

## 2023-10-25 ENCOUNTER — Other Ambulatory Visit: Payer: Self-pay

## 2023-10-25 DIAGNOSIS — C3490 Malignant neoplasm of unspecified part of unspecified bronchus or lung: Secondary | ICD-10-CM

## 2023-10-25 NOTE — Progress Notes (Signed)
 Spoke with patient in regards to CT CAB scheduled at [facility] on 11/10/23.  Patient reports that she has new insurance and is cheaper to get her scan done at [facility] imaging. Canceled scan appt for 11/10/23 @ [facility].  Modified the scan orders to external and once signed by provider, orders will be faxed over to [facility] at [phone] per patients request.

## 2023-11-10 ENCOUNTER — Ambulatory Visit (HOSPITAL_COMMUNITY)

## 2023-11-10 ENCOUNTER — Other Ambulatory Visit

## 2023-11-15 ENCOUNTER — Telehealth: Payer: Self-pay | Admitting: Medical Oncology

## 2023-11-15 NOTE — Telephone Encounter (Signed)
 Pt had CT scans at [facility] and results are in Care everywhere. F/U appt tomorrow.

## 2023-11-16 ENCOUNTER — Inpatient Hospital Stay: Attending: Internal Medicine | Admitting: Internal Medicine

## 2023-11-16 ENCOUNTER — Telehealth: Payer: Self-pay | Admitting: Medical Oncology

## 2023-11-16 VITALS — BP 135/80 | HR 61 | Temp 97.7°F | Resp 17 | Ht 64.0 in | Wt 144.1 lb

## 2023-11-16 DIAGNOSIS — C3431 Malignant neoplasm of lower lobe, right bronchus or lung: Secondary | ICD-10-CM | POA: Diagnosis present

## 2023-11-16 DIAGNOSIS — C3491 Malignant neoplasm of unspecified part of right bronchus or lung: Secondary | ICD-10-CM

## 2023-11-16 NOTE — Telephone Encounter (Signed)
 Do I complete the 8 unopened bottle of pills . She has 1 bottle with  some tablets left to finish.   I told her to finish her open bottle and then stop. She does not to wean herself off of the drug.

## 2023-11-16 NOTE — Progress Notes (Signed)
 [facility] [phone]   Fax:(336) [phone]  OFFICE PROGRESS NOTE  Lynwood Laneta ORN, PA-C [address] [place] [address]  DIAGNOSIS: Recurrent non-small cell lung cancer initially diagnosed as stage Ib (T2 a, N0, M0) non-small cell lung cancer, adenocarcinoma in September 2019.  The patient had bilateral pulmonary nodules including 2 nodules in the left upper lobe and 1 nodule in the right middle lobe in December 2021. Biomarker Findings Microsatellite status - MS-Stable Tumor Mutational Burden - 4 Muts/Mb Genomic Findings For a complete list of the genes assayed, please refer to the Appendix. 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 7 Disease relevant genes with no reportable alterations: ALK, BRAF, ERBB2, KRAS, MET, RET, ROS1  PDL1 Expression: 1%.  PRIOR THERAPY: Status post right lower lobectomy with lymph node dissection on February 10, 2018 under the care of Dr. Army.  CURRENT THERAPY: Tagrisso  80 mg p.o. daily.  First dose started on September 12, 2020.  Status post 35  months of treatment.  INTERVAL HISTORY: Shelby Green 73 y.o. female returns to the clinic today for follow-up visit. Discussed the use of AI scribe software for clinical note transcription with the patient, who gave verbal consent to proceed.  History of Present Illness   Shelby Green is a 73 year old female with recurrent non-small cell lung cancer who presents for evaluation and repeat blood work.  She has a history of recurrent non-small cell lung cancer, adenocarcinoma, initially diagnosed as stage 1B in September 2019. Disease progression was noted in December 2021, and her tumor tested positive for an EGFR mutation with exon 19 deletion.  She has been on Tagrisso  80 mg orally once daily since April 2022, with a treatment duration of approximately 38 months. She feels 'pretty good' overall.  A recent scan was performed at  [facility] due to cost considerations and a change in her insurance. Blood work was completed at the current facility, but results were not immediately available during the visit.  She experiences occasional neck pain. An ultrasound of the neck was performed previously, showing no concerning findings. No other new symptoms or changes in her condition are reported.        MEDICAL HISTORY: Past Medical History:  Diagnosis Date   Allergy    Back pain    Cancer (HCC)    Hypertension    Hypertension    Lung cancer (HCC) 01/2018   Lung mass    Wears dentures    Wears glasses     ALLERGIES:  is allergic to metoprolol and penicillin g.  MEDICATIONS:  Current Outpatient Medications  Medication Sig Dispense Refill   latanoprost  (XALATAN ) 0.005 % ophthalmic solution      osimertinib  mesylate (TAGRISSO ) 80 MG tablet Take 1 tablet (80 mg total) by mouth daily. 30 tablet 3   No current facility-administered medications for this visit.    SURGICAL HISTORY:  Past Surgical History:  Procedure Laterality Date   BREAST BIOPSY Left 03/23/2022   X 2  REACTIVE LYMPHOID HYPERPLASIA  and FIBROADENOMA   COLONOSCOPY W/ BIOPSIES AND POLYPECTOMY     MULTIPLE TOOTH EXTRACTIONS     VIDEO ASSISTED THORACOSCOPY (VATS)/WEDGE RESECTION Right 02/14/2018   Procedure: RIGHT  VIDEO ASSISTED THORACOSCOPY (VATS) WITH RIGHT LOWER LOBE (RLL) WEDGE RESECTION, COMPLETION RLL LOBECTOMY, LYMPH NODE DISSECTION, ON-Q PLACEMENT;  Surgeon: Army Dallas NOVAK, MD;  Location: [facility] OR;  Service: Thoracic;  Laterality: Right;   VIDEO  BRONCHOSCOPY N/A 02/14/2018   Procedure: VIDEO BRONCHOSCOPY;  Surgeon: Army Dallas NOVAK, MD;  Location: [facility] OR;  Service: Thoracic;  Laterality: N/A;    REVIEW OF SYSTEMS:  Constitutional: positive for fatigue Eyes: negative Ears, nose, mouth, throat, and face: negative Respiratory: negative Cardiovascular: negative Gastrointestinal: negative Genitourinary:negative Integument/breast:  negative Hematologic/lymphatic: negative Musculoskeletal:negative Neurological: negative Behavioral/Psych: negative Endocrine: negative Allergic/Immunologic: negative   PHYSICAL EXAMINATION: General appearance: alert, cooperative, fatigued, and no distress Head: Normocephalic, without obvious abnormality, atraumatic Neck: no adenopathy, no JVD, supple, symmetrical, trachea midline, and thyroid not enlarged, symmetric, no tenderness/mass/nodules Lymph nodes: Cervical, supraclavicular, and axillary nodes normal. Resp: clear to auscultation bilaterally Back: symmetric, no curvature. ROM normal. No CVA tenderness. Cardio: regular rate and rhythm, S1, S2 normal, no murmur, click, rub or gallop GI: soft, non-tender; bowel sounds normal; no masses,  no organomegaly Extremities: extremities normal, atraumatic, no cyanosis or edema Neurologic: Alert and oriented X 3, normal strength and tone. Normal symmetric reflexes. Normal coordination and gait  ECOG PERFORMANCE STATUS: 1 - Symptomatic but completely ambulatory  Blood pressure 135/80, pulse 61, temperature 97.7 F (36.5 C), temperature source Temporal, resp. rate 17, height 5' 4 (1.626 m), weight 144 lb 1.6 oz (65.4 kg), SpO2 100%.  LABORATORY DATA: Lab Results  Component Value Date   WBC 4.3 08/22/2023   HGB 11.2 (L) 08/22/2023   HCT 34.0 (L) 08/22/2023   MCV 93.7 08/22/2023   PLT 176 08/22/2023      Chemistry      Component Value Date/Time   NA 141 08/22/2023 0940   K 3.9 08/22/2023 0940   CL 108 08/22/2023 0940   CO2 30 08/22/2023 0940   BUN 22 08/22/2023 0940   CREATININE 1.33 (H) 08/22/2023 0940   CREATININE 0.83 08/06/2015 1616      Component Value Date/Time   CALCIUM 9.2 08/22/2023 0940   ALKPHOS 54 08/22/2023 0940   AST 17 08/22/2023 0940   ALT 13 08/22/2023 0940   BILITOT 0.8 08/22/2023 0940       RADIOGRAPHIC STUDIES: No results found.   ASSESSMENT AND PLAN: This is a very pleasant 73 years old  African-American female with highly suspicious recurrent non-small cell lung cancer, initially diagnosed as a stage Ib (T2 a, N0, M0) non-small cell lung cancer, adenocarcinoma status post right lower lobectomy with lymph node dissection in September 2019 under the care of Dr. Army.  The patient had evidence for disease recurrence in December 2021 presenting with 2 pulmonary nodules in the left upper lobe as well as 1 nodule in the right middle lobe. 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  Her PD-L1 expression is 1%. The patient was found on previous imaging studies to have evidence for disease progression with enlarging pulmonary nodules. She started treatment with Tagrisso  80 mg p.o. daily on September 12, 2020.  She is status post 38 months of treatment.  She has been tolerating her treatment fairly well. She had repeat CT scan of the chest, abdomen and pelvis performed at [facility].  Her scan showed no concerning findings for disease recurrence or metastasis. Assessment and Plan    Recurrent non-small cell lung cancer, adenocarcinoma, EGFR mutation Recurrent non-small cell lung cancer, adenocarcinoma with EGFR mutation, initially diagnosed as stage IB in September 2019. Evidence of disease progression in December 2021. Currently on Tagrisso  (osimertinib ) 80 mg daily for approximately 38 months. Recent scan shows no concerning findings. Neck pain reported,  possibly due to arthritis, as ultrasound showed no concerning findings. - Continue Tagrisso  until current supply is finished, then discontinue. - Order blood work in 3 months. - Order scan in 6 months. - Discuss decision to discontinue Tagrisso  at next visit.   The patient was advised to call immediately if she has any concerning symptoms in the interval. The patient voices understanding of current disease status and treatment options and is in agreement with the current  care plan. The total time spent in the appointment was 30 minutes.  All questions were answered. The patient knows to call the clinic with any problems, questions or concerns. We can certainly see the patient much sooner if necessary.  Disclaimer: This note was dictated with voice recognition software. Similar sounding words can inadvertently be transcribed and may not be corrected upon review.

## 2023-11-17 ENCOUNTER — Other Ambulatory Visit: Payer: Self-pay

## 2024-01-27 ENCOUNTER — Other Ambulatory Visit: Payer: Self-pay

## 2024-02-15 ENCOUNTER — Inpatient Hospital Stay: Attending: Internal Medicine

## 2024-02-15 ENCOUNTER — Inpatient Hospital Stay (HOSPITAL_BASED_OUTPATIENT_CLINIC_OR_DEPARTMENT_OTHER): Admitting: Internal Medicine

## 2024-02-15 VITALS — BP 135/75 | HR 62 | Temp 97.8°F | Resp 17 | Ht 64.0 in

## 2024-02-15 DIAGNOSIS — Z902 Acquired absence of lung [part of]: Secondary | ICD-10-CM | POA: Diagnosis not present

## 2024-02-15 DIAGNOSIS — I129 Hypertensive chronic kidney disease with stage 1 through stage 4 chronic kidney disease, or unspecified chronic kidney disease: Secondary | ICD-10-CM | POA: Insufficient documentation

## 2024-02-15 DIAGNOSIS — D649 Anemia, unspecified: Secondary | ICD-10-CM | POA: Insufficient documentation

## 2024-02-15 DIAGNOSIS — C3490 Malignant neoplasm of unspecified part of unspecified bronchus or lung: Secondary | ICD-10-CM

## 2024-02-15 DIAGNOSIS — N182 Chronic kidney disease, stage 2 (mild): Secondary | ICD-10-CM | POA: Insufficient documentation

## 2024-02-15 DIAGNOSIS — Z880 Allergy status to penicillin: Secondary | ICD-10-CM | POA: Insufficient documentation

## 2024-02-15 DIAGNOSIS — Z85118 Personal history of other malignant neoplasm of bronchus and lung: Secondary | ICD-10-CM | POA: Diagnosis present

## 2024-02-15 DIAGNOSIS — C3491 Malignant neoplasm of unspecified part of right bronchus or lung: Secondary | ICD-10-CM

## 2024-02-15 LAB — CBC WITH DIFFERENTIAL (CANCER CENTER ONLY)
Abs Immature Granulocytes: 0.01 K/uL (ref 0.00–0.07)
Basophils Absolute: 0 K/uL (ref 0.0–0.1)
Basophils Relative: 1 %
Eosinophils Absolute: 0.2 K/uL (ref 0.0–0.5)
Eosinophils Relative: 4 %
HCT: 35.5 % — ABNORMAL LOW (ref 36.0–46.0)
Hemoglobin: 11.7 g/dL — ABNORMAL LOW (ref 12.0–15.0)
Immature Granulocytes: 0 %
Lymphocytes Relative: 36 %
Lymphs Abs: 1.4 K/uL (ref 0.7–4.0)
MCH: 30.1 pg (ref 26.0–34.0)
MCHC: 33 g/dL (ref 30.0–36.0)
MCV: 91.3 fL (ref 80.0–100.0)
Monocytes Absolute: 0.3 K/uL (ref 0.1–1.0)
Monocytes Relative: 8 %
Neutro Abs: 2 K/uL (ref 1.7–7.7)
Neutrophils Relative %: 51 %
Platelet Count: 217 K/uL (ref 150–400)
RBC: 3.89 MIL/uL (ref 3.87–5.11)
RDW: 13.7 % (ref 11.5–15.5)
WBC Count: 3.9 K/uL — ABNORMAL LOW (ref 4.0–10.5)
nRBC: 0 % (ref 0.0–0.2)

## 2024-02-15 LAB — CMP (CANCER CENTER ONLY)
ALT: 13 U/L (ref 0–44)
AST: 18 U/L (ref 15–41)
Albumin: 4 g/dL (ref 3.5–5.0)
Alkaline Phosphatase: 71 U/L (ref 38–126)
Anion gap: 4 — ABNORMAL LOW (ref 5–15)
BUN: 19 mg/dL (ref 8–23)
CO2: 31 mmol/L (ref 22–32)
Calcium: 9.2 mg/dL (ref 8.9–10.3)
Chloride: 108 mmol/L (ref 98–111)
Creatinine: 1.32 mg/dL — ABNORMAL HIGH (ref 0.44–1.00)
GFR, Estimated: 43 mL/min — ABNORMAL LOW (ref >60)
Glucose, Bld: 85 mg/dL (ref 70–99)
Potassium: 3.9 mmol/L (ref 3.5–5.1)
Sodium: 143 mmol/L (ref 135–145)
Total Bilirubin: 0.8 mg/dL (ref 0.0–1.2)
Total Protein: 6.6 g/dL (ref 6.5–8.1)

## 2024-02-15 NOTE — Progress Notes (Signed)
 [facility] [phone]   Fax:(336) [phone]  OFFICE PROGRESS NOTE  Lynwood Laneta ORN, PA-C [address] [place] [address]  DIAGNOSIS: Recurrent non-small cell lung cancer initially diagnosed as stage Ib (T2 a, N0, M0) non-small cell lung cancer, adenocarcinoma in September 2019.  The patient had bilateral pulmonary nodules including 2 nodules in the left upper lobe and 1 nodule in the right middle lobe in December 2021. Biomarker Findings Microsatellite status - MS-Stable Tumor Mutational Burden - 4 Muts/Mb Genomic Findings For a complete list of the genes assayed, please refer to the Appendix. 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 7 Disease relevant genes with no reportable alterations: ALK, BRAF, ERBB2, KRAS, MET, RET, ROS1  PDL1 Expression: 1%.  PRIOR THERAPY: Status post right lower lobectomy with lymph node dissection on February 10, 2018 under the care of Dr. Army.  CURRENT THERAPY: Tagrisso  80 mg p.o. daily.  First dose started on September 12, 2020.  Status post 41  months of treatment.  INTERVAL HISTORY: Shelby Green 73 y.o. female returns to the clinic today for follow-up visit.  Discussed the use of AI scribe software for clinical note transcription with the patient, who gave verbal consent to proceed.  History of Present Illness Shelby Green is a 72 year old female with lung cancer who presents for evaluation with repeat blood work.  She has a history of lung adenocarcinoma with an EGFR mutation and has been on Tagrisso  (osimertinib ) 80 mg daily since April 2022, totaling 41 months of treatment. She reports no significant side effects such as diarrhea, rash, fatigue, or weakness.  She underwent surgery for her lung cancer, which occasionally causes some discomfort. She experiences intermittent neck pain, which she suspects might be arthritis, and plans to consult her primary care  provider next month.  Recent lab work shows a hemoglobin level of 11.7, compared to 11.2 in April. Her serum creatinine is 1.32, consistent with previous results. No new complaints since her last visit in June.  No diarrhea, rash, fatigue, or weakness.   MEDICAL HISTORY: Past Medical History:  Diagnosis Date   Allergy    Back pain    Cancer (HCC)    Hypertension    Hypertension    Lung cancer (HCC) 01/2018   Lung mass    Wears dentures    Wears glasses     ALLERGIES:  is allergic to metoprolol and penicillin g.  MEDICATIONS:  Current Outpatient Medications  Medication Sig Dispense Refill   latanoprost  (XALATAN ) 0.005 % ophthalmic solution      osimertinib  mesylate (TAGRISSO ) 80 MG tablet Take 1 tablet (80 mg total) by mouth daily. 30 tablet 3   No current facility-administered medications for this visit.    SURGICAL HISTORY:  Past Surgical History:  Procedure Laterality Date   BREAST BIOPSY Left 03/23/2022   X 2  REACTIVE LYMPHOID HYPERPLASIA  and FIBROADENOMA   COLONOSCOPY W/ BIOPSIES AND POLYPECTOMY     MULTIPLE TOOTH EXTRACTIONS     VIDEO ASSISTED THORACOSCOPY (VATS)/WEDGE RESECTION Right 02/14/2018   Procedure: RIGHT  VIDEO ASSISTED THORACOSCOPY (VATS) WITH RIGHT LOWER LOBE (RLL) WEDGE RESECTION, COMPLETION RLL LOBECTOMY, LYMPH NODE DISSECTION, ON-Q PLACEMENT;  Surgeon: Army Dallas NOVAK, MD;  Location: [facility] OR;  Service: Thoracic;  Laterality: Right;   VIDEO BRONCHOSCOPY N/A 02/14/2018   Procedure: VIDEO BRONCHOSCOPY;  Surgeon: Army Dallas NOVAK, MD;  Location: [facility] OR;  Service: Thoracic;  Laterality: N/A;  REVIEW OF SYSTEMS:  A comprehensive review of systems was negative except for: Musculoskeletal: positive for arthralgias   PHYSICAL EXAMINATION: General appearance: alert, cooperative, and no distress Head: Normocephalic, without obvious abnormality, atraumatic Neck: no adenopathy, no JVD, supple, symmetrical, trachea midline, and thyroid not enlarged,  symmetric, no tenderness/mass/nodules Lymph nodes: Cervical, supraclavicular, and axillary nodes normal. Resp: clear to auscultation bilaterally Back: symmetric, no curvature. ROM normal. No CVA tenderness. Cardio: regular rate and rhythm, S1, S2 normal, no murmur, click, rub or gallop GI: soft, non-tender; bowel sounds normal; no masses,  no organomegaly Extremities: extremities normal, atraumatic, no cyanosis or edema  ECOG PERFORMANCE STATUS: 1 - Symptomatic but completely ambulatory  Blood pressure 135/75, pulse 62, temperature 97.8 F (36.6 C), temperature source Temporal, resp. rate 17, height 5' 4 (1.626 m), SpO2 100%.  LABORATORY DATA: Lab Results  Component Value Date   WBC 3.9 (L) 02/15/2024   HGB 11.7 (L) 02/15/2024   HCT 35.5 (L) 02/15/2024   MCV 91.3 02/15/2024   PLT 217 02/15/2024      Chemistry      Component Value Date/Time   NA 143 02/15/2024 0936   K 3.9 02/15/2024 0936   CL 108 02/15/2024 0936   CO2 31 02/15/2024 0936   BUN 19 02/15/2024 0936   CREATININE 1.32 (H) 02/15/2024 0936   CREATININE 0.83 08/06/2015 1616      Component Value Date/Time   CALCIUM 9.2 02/15/2024 0936   ALKPHOS 71 02/15/2024 0936   AST 18 02/15/2024 0936   ALT 13 02/15/2024 0936   BILITOT 0.8 02/15/2024 0936       RADIOGRAPHIC STUDIES: No results found.   ASSESSMENT AND PLAN: This is a very pleasant 73 years old African-American female with highly suspicious recurrent non-small cell lung cancer, initially diagnosed as a stage Ib (T2 a, N0, M0) non-small cell lung cancer, adenocarcinoma status post right lower lobectomy with lymph node dissection in September 2019 under the care of Dr. Army.  The patient had evidence for disease recurrence in December 2021 presenting with 2 pulmonary nodules in the left upper lobe as well as 1 nodule in the right middle lobe. 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  Her PD-L1  expression is 1%. The patient was found on previous imaging studies to have evidence for disease progression with enlarging pulmonary nodules. She started treatment with Tagrisso  80 mg p.o. daily on September 12, 2020.  She is status post 41 months of treatment.  She has been tolerating her treatment fairly well. She had repeat blood work performed earlier today that was unremarkable except for mild anemia and renal insufficiency. Assessment and Plan Assessment & Plan EGFR-mutated lung adenocarcinoma, status post resection Currently on osimertinib  (Tagrisso ) 80 mg daily since April 2022. No new complaints or side effects such as diarrhea, rash, fatigue, or weakness. Recent surgery caused some flare-ups, but overall condition is stable. Lab work shows mild anemia and well-managed kidney function. - Continue osimertinib  (Tagrisso ) 80 mg daily - Schedule a scan one week before the next appointment - Follow up in three months  Anemia Mild anemia with hemoglobin at 11.7 g/dL, improved from 88.7 g/dL in April.  Chronic kidney disease, stage 2 Serum creatinine at 1.32 mg/dL, consistent with previous results in April. - Encourage increased hydration She was advised to call immediately if she has any concerning symptoms in the interval.  The patient voices understanding of current disease status and treatment  options and is in agreement with the current care plan. The total time spent in the appointment was 30 minutes.  All questions were answered. The patient knows to call the clinic with any problems, questions or concerns. We can certainly see the patient much sooner if necessary.  Disclaimer: This note was dictated with voice recognition software. Similar sounding words can inadvertently be transcribed and may not be corrected upon review.

## 2024-02-16 ENCOUNTER — Other Ambulatory Visit: Payer: Self-pay

## 2024-03-07 ENCOUNTER — Encounter: Payer: Self-pay | Admitting: Internal Medicine

## 2024-03-07 ENCOUNTER — Other Ambulatory Visit (HOSPITAL_COMMUNITY): Payer: Self-pay

## 2024-03-27 ENCOUNTER — Other Ambulatory Visit: Payer: Self-pay | Admitting: Family Medicine

## 2024-03-27 DIAGNOSIS — Z1231 Encounter for screening mammogram for malignant neoplasm of breast: Secondary | ICD-10-CM

## 2024-04-04 ENCOUNTER — Telehealth: Payer: Self-pay | Admitting: *Deleted

## 2024-04-04 NOTE — Telephone Encounter (Signed)
 Called pt. to inform her that per Dr. Sherrod she is off Tagrisso  and he will discuss with her at next appt. 05/21/24.

## 2024-05-02 ENCOUNTER — Ambulatory Visit
Admission: RE | Admit: 2024-05-02 | Discharge: 2024-05-02 | Disposition: A | Discharge status: Home / Self Care | Source: Ambulatory Visit | Attending: Family Medicine | Admitting: Family Medicine

## 2024-05-02 DIAGNOSIS — Z1231 Encounter for screening mammogram for malignant neoplasm of breast: Secondary | ICD-10-CM

## 2024-05-09 ENCOUNTER — Encounter: Payer: Self-pay | Admitting: Internal Medicine

## 2024-05-14 ENCOUNTER — Ambulatory Visit (HOSPITAL_COMMUNITY)
Admission: RE | Admit: 2024-05-14 | Discharge: 2024-05-14 | Disposition: A | Discharge status: Home / Self Care | Source: Ambulatory Visit | Attending: Internal Medicine | Admitting: Internal Medicine

## 2024-05-14 ENCOUNTER — Inpatient Hospital Stay: Attending: Internal Medicine

## 2024-05-14 DIAGNOSIS — Z85118 Personal history of other malignant neoplasm of bronchus and lung: Secondary | ICD-10-CM | POA: Insufficient documentation

## 2024-05-14 DIAGNOSIS — C3490 Malignant neoplasm of unspecified part of unspecified bronchus or lung: Secondary | ICD-10-CM | POA: Insufficient documentation

## 2024-05-14 LAB — CMP (CANCER CENTER ONLY)
ALT: 15 U/L (ref 0–44)
AST: 22 U/L (ref 15–41)
Albumin: 4.3 g/dL (ref 3.5–5.0)
Alkaline Phosphatase: 81 U/L (ref 38–126)
Anion gap: 7 (ref 5–15)
BUN: 20 mg/dL (ref 8–23)
CO2: 30 mmol/L (ref 22–32)
Calcium: 9.4 mg/dL (ref 8.9–10.3)
Chloride: 108 mmol/L (ref 98–111)
Creatinine: 1.28 mg/dL — ABNORMAL HIGH (ref 0.44–1.00)
GFR, Estimated: 44 mL/min — ABNORMAL LOW
Glucose, Bld: 90 mg/dL (ref 70–99)
Potassium: 4 mmol/L (ref 3.5–5.1)
Sodium: 145 mmol/L (ref 135–145)
Total Bilirubin: 0.7 mg/dL (ref 0.0–1.2)
Total Protein: 6.7 g/dL (ref 6.5–8.1)

## 2024-05-14 LAB — CBC WITH DIFFERENTIAL (CANCER CENTER ONLY)
Abs Immature Granulocytes: 0.01 K/uL (ref 0.00–0.07)
Basophils Absolute: 0.1 K/uL (ref 0.0–0.1)
Basophils Relative: 1 %
Eosinophils Absolute: 0.3 K/uL (ref 0.0–0.5)
Eosinophils Relative: 6 %
HCT: 37.9 % (ref 36.0–46.0)
Hemoglobin: 12.8 g/dL (ref 12.0–15.0)
Immature Granulocytes: 0 %
Lymphocytes Relative: 33 %
Lymphs Abs: 1.5 K/uL (ref 0.7–4.0)
MCH: 30.3 pg (ref 26.0–34.0)
MCHC: 33.8 g/dL (ref 30.0–36.0)
MCV: 89.6 fL (ref 80.0–100.0)
Monocytes Absolute: 0.4 K/uL (ref 0.1–1.0)
Monocytes Relative: 9 %
Neutro Abs: 2.3 K/uL (ref 1.7–7.7)
Neutrophils Relative %: 51 %
Platelet Count: 240 K/uL (ref 150–400)
RBC: 4.23 MIL/uL (ref 3.87–5.11)
RDW: 13.9 % (ref 11.5–15.5)
WBC Count: 4.5 K/uL (ref 4.0–10.5)
nRBC: 0 % (ref 0.0–0.2)

## 2024-05-14 MED ORDER — IOHEXOL 300 MG/ML  SOLN
75.0000 mL | Freq: Once | INTRAMUSCULAR | Status: AC | PRN
  Administered 2024-05-14: 75 mL via INTRAVENOUS

## 2024-05-21 ENCOUNTER — Inpatient Hospital Stay (HOSPITAL_BASED_OUTPATIENT_CLINIC_OR_DEPARTMENT_OTHER): Admitting: Internal Medicine

## 2024-05-21 VITALS — BP 139/76 | HR 64 | Temp 98.5°F | Resp 17 | Ht 64.0 in | Wt 149.0 lb

## 2024-05-21 DIAGNOSIS — C3491 Malignant neoplasm of unspecified part of right bronchus or lung: Secondary | ICD-10-CM | POA: Diagnosis not present

## 2024-05-21 DIAGNOSIS — Z85118 Personal history of other malignant neoplasm of bronchus and lung: Secondary | ICD-10-CM | POA: Diagnosis not present

## 2024-05-21 NOTE — Progress Notes (Signed)
 "     [facility] Telephone:(336) [phone]   Fax:(336) [phone]  OFFICE PROGRESS NOTE  Lynwood Laneta ORN, PA-C [address] [place] [address]  DIAGNOSIS: Recurrent non-small cell lung cancer initially diagnosed as stage Ib (T2 a, N0, M0) non-small cell lung cancer, adenocarcinoma in September 2019.  The patient had bilateral pulmonary nodules including 2 nodules in the left upper lobe and 1 nodule in the right middle lobe in December 2021. Biomarker Findings Microsatellite status - MS-Stable Tumor Mutational Burden - 4 Muts/Mb Genomic Findings For a complete list of the genes assayed, please refer to the Appendix. 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  PRIOR THERAPY:  1) Status post right lower lobectomy with lymph node dissection on February 10, 2018 under the care of Dr. Army. 2) Tagrisso  80 mg p.o. daily.  First dose started on September 12, 2020.  Status post 41  months of treatment.  CURRENT THERAPY: Observation  INTERVAL HISTORY: Shelby Green 73 y.o. female returns to the clinic today for follow-up visit.  Discussed the use of AI scribe software for clinical note transcription with the patient, who gave verbal consent to proceed.  History of Present Illness Shelby Green is a 73 year old female with recurrent EGFR-mutated non-small cell lung cancer who presents for evaluation of recent chest CT imaging after discontinuing osimertinib .  She was diagnosed with stage IB non-small cell lung cancer with EGFR exon 19 deletion in September 2019, experienced recurrence with bilateral pulmonary nodules in December 2021, and received osimertinib  80 mg daily for approximately 44 months until discontinuation four months ago. She is currently not receiving systemic therapy.  Since discontinuing osimertinib ,  she has developed mild back pain localized to the area of prior lung surgery and intermittent headaches, both beginning this week. She also reports persistent leg pain predating discontinuation of therapy. The back pain tends to recur in the winter. She endorses mild fatigue but denies acute neurological deficits or changes in consciousness. No other new or worsening symptoms are reported.  She underwent a surveillance chest CT last week.    MEDICAL HISTORY: Past Medical History:  Diagnosis Date   Allergy    Back pain    Cancer (HCC)    Hypertension    Hypertension    Lung cancer (HCC) 01/2018   Lung mass    Wears dentures    Wears glasses     ALLERGIES:  is allergic to metoprolol and penicillin g.  MEDICATIONS:  Current Outpatient Medications  Medication Sig Dispense Refill   latanoprost  (XALATAN ) 0.005 % ophthalmic solution      osimertinib  mesylate (TAGRISSO ) 80 MG tablet Take 1 tablet (80 mg total) by mouth daily. 30 tablet 3   No current facility-administered medications for this visit.    SURGICAL HISTORY:  Past Surgical History:  Procedure Laterality Date   BREAST BIOPSY Left 03/23/2022   X 2  REACTIVE LYMPHOID HYPERPLASIA  and FIBROADENOMA   COLONOSCOPY W/ BIOPSIES AND POLYPECTOMY     MULTIPLE TOOTH EXTRACTIONS     VIDEO ASSISTED THORACOSCOPY (VATS)/WEDGE RESECTION Right 02/14/2018   Procedure: RIGHT  VIDEO ASSISTED THORACOSCOPY (VATS) WITH RIGHT LOWER LOBE (RLL) WEDGE RESECTION, COMPLETION RLL LOBECTOMY, LYMPH NODE DISSECTION, ON-Q PLACEMENT;  Surgeon: Army Dallas NOVAK, MD;  Location: [facility] OR;  Service: Thoracic;  Laterality: Right;   VIDEO BRONCHOSCOPY N/A 02/14/2018  Procedure: VIDEO BRONCHOSCOPY;  Surgeon: Army Dallas NOVAK, MD;  Location: [facility] OR;  Service: Thoracic;  Laterality: N/A;    REVIEW OF SYSTEMS:  Constitutional: negative Eyes: negative Ears, nose, mouth, throat, and face: negative Respiratory: positive for pleurisy/chest pain Cardiovascular:  negative Gastrointestinal: negative Genitourinary:negative Integument/breast: negative Hematologic/lymphatic: negative Musculoskeletal:negative Neurological: negative Behavioral/Psych: negative Endocrine: negative Allergic/Immunologic: negative   PHYSICAL EXAMINATION: General appearance: alert, cooperative, and no distress Head: Normocephalic, without obvious abnormality, atraumatic Neck: no adenopathy, no JVD, supple, symmetrical, trachea midline, and thyroid not enlarged, symmetric, no tenderness/mass/nodules Lymph nodes: Cervical, supraclavicular, and axillary nodes normal. Resp: clear to auscultation bilaterally Back: symmetric, no curvature. ROM normal. No CVA tenderness. Cardio: regular rate and rhythm, S1, S2 normal, no murmur, click, rub or gallop GI: soft, non-tender; bowel sounds normal; no masses,  no organomegaly Extremities: extremities normal, atraumatic, no cyanosis or edema Neurologic: Alert and oriented X 3, normal strength and tone. Normal symmetric reflexes. Normal coordination and gait  ECOG PERFORMANCE STATUS: 1 - Symptomatic but completely ambulatory  Blood pressure 139/76, pulse 64, temperature 98.5 F (36.9 C), temperature source Temporal, resp. rate 17, height 5' 4 (1.626 m), weight 149 lb (67.6 kg), SpO2 97%.  LABORATORY DATA: Lab Results  Component Value Date   WBC 4.5 05/14/2024   HGB 12.8 05/14/2024   HCT 37.9 05/14/2024   MCV 89.6 05/14/2024   PLT 240 05/14/2024      Chemistry      Component Value Date/Time   NA 145 05/14/2024 0831   K 4.0 05/14/2024 0831   CL 108 05/14/2024 0831   CO2 30 05/14/2024 0831   BUN 20 05/14/2024 0831   CREATININE 1.28 (H) 05/14/2024 0831   CREATININE 0.83 08/06/2015 1616      Component Value Date/Time   CALCIUM 9.4 05/14/2024 0831   ALKPHOS 81 05/14/2024 0831   AST 22 05/14/2024 0831   ALT 15 05/14/2024 0831   BILITOT 0.7 05/14/2024 0831       RADIOGRAPHIC STUDIES: MM 3D SCREENING MAMMOGRAM  BILATERAL BREAST Result Date: 05/07/2024 CLINICAL DATA:  Screening. EXAM: DIGITAL SCREENING BILATERAL MAMMOGRAM WITH TOMOSYNTHESIS AND CAD TECHNIQUE: Bilateral screening digital craniocaudal and mediolateral oblique mammograms were obtained. Bilateral screening digital breast tomosynthesis was performed. The images were evaluated with computer-aided detection. COMPARISON:  Previous exam(s). ACR Breast Density Category b: There are scattered areas of fibroglandular density. FINDINGS: There are no findings suspicious for malignancy. IMPRESSION: No mammographic evidence of malignancy. A result letter of this screening mammogram will be mailed directly to the patient. RECOMMENDATION: Screening mammogram in one year. (Code:SM-B-01Y) BI-RADS CATEGORY  1: Negative. Electronically Signed   By: Dina  Arceo M.D.   On: 05/07/2024 15:49     ASSESSMENT AND PLAN: This is a very pleasant 73 years old African-American female with highly suspicious recurrent non-small cell lung cancer, initially diagnosed as a stage Ib (T2 a, N0, M0) non-small cell lung cancer, adenocarcinoma status post right lower lobectomy with lymph node dissection in September 2019 under the care of Dr. Army.  The patient had evidence for disease recurrence in December 2021 presenting with 2 pulmonary nodules in the left upper lobe as well as 1 nodule in the right middle lobe. 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  Her PD-L1 expression is 1%. The patient was found on previous imaging studies to have evidence for disease progression with enlarging pulmonary nodules. She started treatment with Tagrisso  80 mg p.o. daily on  September 12, 2020.  She is status post 41 months of treatment.  She has been tolerating her treatment fairly well. The patient had repeat CT scan of the chest performed recently.  The final report is still pending but I personally and independently reviewed the scan  images in comparison to the previous CT of the chest performed a year ago and I do not see clear evidence for progression but I will wait for the final report for confirmation. Assessment and Plan Assessment & Plan Recurrent non-small cell lung cancer with EGFR exon 19 deletion mutation Recurrent non-small cell lung cancer with EGFR exon 19 deletion mutation, previously managed with osimertinib  for 44 months, discontinued four months ago. She currently experiences mild back pain and intermittent headaches, without new or concerning symptoms. Chest CT reviewed today demonstrates no significant change compared to prior imaging from one year ago, consistent with stable disease off therapy. EGFR mutation confers increased risk of central nervous system involvement, necessitating vigilance for neurological symptoms. - Reviewed recent chest CT; no significant change from prior study. - Continued holding osimertinib  given clinical and radiographic stability. - Advised to promptly report worsening headache or new neurological symptoms due to elevated risk of brain metastases associated with EGFR mutation. - Scheduled follow-up in 3 months with laboratory evaluation. - Discussed option for referral to a local oncologist if she relocates. The patient voices understanding of current disease status and treatment options and is in agreement with the current care plan. The total time spent in the appointment was 30 minutes including review of chart and various tests results, discussions about plan of care and coordination of care plan .   All questions were answered. The patient knows to call the clinic with any problems, questions or concerns. We can certainly see the patient much sooner if necessary.  Disclaimer: This note was dictated with voice recognition software. Similar sounding words can inadvertently be transcribed and may not be corrected upon review.       "

## 2024-05-22 ENCOUNTER — Other Ambulatory Visit: Payer: Self-pay

## 2024-08-05 ENCOUNTER — Inpatient Hospital Stay

## 2024-08-05 ENCOUNTER — Inpatient Hospital Stay: Admitting: Internal Medicine
